# Patient Record
Sex: Male | Born: 1935 | Race: White | Hispanic: No | Marital: Married | Smoking: Current some day smoker
Health system: Southern US, Community
[De-identification: ages and names within clinical notes are randomized; demographics above are authoritative.]

## PROBLEM LIST (undated history)

## (undated) DIAGNOSIS — I714 Abdominal aortic aneurysm, without rupture, unspecified: Secondary | ICD-10-CM

## (undated) DIAGNOSIS — R296 Repeated falls: Secondary | ICD-10-CM

## (undated) DIAGNOSIS — Z8619 Personal history of other infectious and parasitic diseases: Secondary | ICD-10-CM

## (undated) DIAGNOSIS — I10 Essential (primary) hypertension: Secondary | ICD-10-CM

## (undated) DIAGNOSIS — N200 Calculus of kidney: Secondary | ICD-10-CM

## (undated) DIAGNOSIS — I639 Cerebral infarction, unspecified: Secondary | ICD-10-CM

## (undated) DIAGNOSIS — E785 Hyperlipidemia, unspecified: Secondary | ICD-10-CM

## (undated) DIAGNOSIS — K8020 Calculus of gallbladder without cholecystitis without obstruction: Secondary | ICD-10-CM

## (undated) DIAGNOSIS — K219 Gastro-esophageal reflux disease without esophagitis: Secondary | ICD-10-CM

## (undated) DIAGNOSIS — N189 Chronic kidney disease, unspecified: Secondary | ICD-10-CM

## (undated) DIAGNOSIS — I2510 Atherosclerotic heart disease of native coronary artery without angina pectoris: Secondary | ICD-10-CM

## (undated) DIAGNOSIS — M1990 Unspecified osteoarthritis, unspecified site: Secondary | ICD-10-CM

## (undated) DIAGNOSIS — M109 Gout, unspecified: Secondary | ICD-10-CM

## (undated) DIAGNOSIS — Z87442 Personal history of urinary calculi: Secondary | ICD-10-CM

## (undated) DIAGNOSIS — Z8719 Personal history of other diseases of the digestive system: Secondary | ICD-10-CM

## (undated) HISTORY — DX: Gastro-esophageal reflux disease without esophagitis: K21.9

## (undated) HISTORY — DX: Essential (primary) hypertension: I10

## (undated) HISTORY — DX: Calculus of gallbladder without cholecystitis without obstruction: K80.20

## (undated) HISTORY — PX: FRACTURE SURGERY: SHX138

## (undated) HISTORY — DX: Abdominal aortic aneurysm, without rupture: I71.4

## (undated) HISTORY — PX: COLONOSCOPY: SHX174

## (undated) HISTORY — DX: Cerebral infarction, unspecified: I63.9

## (undated) HISTORY — PX: CARDIAC CATHETERIZATION: SHX172

## (undated) HISTORY — DX: Abdominal aortic aneurysm, without rupture, unspecified: I71.40

## (undated) HISTORY — DX: Unspecified osteoarthritis, unspecified site: M19.90

## (undated) HISTORY — DX: Hyperlipidemia, unspecified: E78.5

---

## 1998-02-21 ENCOUNTER — Encounter: Payer: Self-pay | Admitting: Physician Assistant

## 1999-05-01 ENCOUNTER — Inpatient Hospital Stay (HOSPITAL_COMMUNITY): Admission: EM | Admit: 1999-05-01 | Discharge: 1999-05-05 | Payer: Self-pay | Admitting: Emergency Medicine

## 2002-07-31 ENCOUNTER — Encounter: Payer: Self-pay | Admitting: Emergency Medicine

## 2002-07-31 ENCOUNTER — Emergency Department (HOSPITAL_COMMUNITY): Admission: EM | Admit: 2002-07-31 | Discharge: 2002-07-31 | Payer: Self-pay | Admitting: Emergency Medicine

## 2007-06-02 ENCOUNTER — Ambulatory Visit: Payer: Self-pay | Admitting: Vascular Surgery

## 2007-12-05 ENCOUNTER — Ambulatory Visit: Payer: Self-pay | Admitting: Vascular Surgery

## 2008-12-10 ENCOUNTER — Ambulatory Visit: Payer: Self-pay | Admitting: Vascular Surgery

## 2009-06-10 ENCOUNTER — Ambulatory Visit: Payer: Self-pay | Admitting: Vascular Surgery

## 2009-06-21 ENCOUNTER — Encounter: Admission: RE | Admit: 2009-06-21 | Discharge: 2009-06-21 | Payer: Self-pay | Admitting: Family Medicine

## 2009-06-21 ENCOUNTER — Inpatient Hospital Stay (HOSPITAL_COMMUNITY): Admission: AD | Admit: 2009-06-21 | Discharge: 2009-06-24 | Payer: Self-pay | Admitting: Surgery

## 2009-06-21 ENCOUNTER — Encounter (INDEPENDENT_AMBULATORY_CARE_PROVIDER_SITE_OTHER): Payer: Self-pay | Admitting: Surgery

## 2009-07-19 ENCOUNTER — Encounter: Admission: RE | Admit: 2009-07-19 | Discharge: 2009-07-19 | Payer: Self-pay | Admitting: Surgery

## 2009-07-27 HISTORY — PX: APPENDECTOMY: SHX54

## 2009-08-09 ENCOUNTER — Inpatient Hospital Stay (HOSPITAL_COMMUNITY): Admission: EM | Admit: 2009-08-09 | Discharge: 2009-08-19 | Payer: Self-pay | Admitting: Emergency Medicine

## 2009-08-09 ENCOUNTER — Ambulatory Visit: Payer: Self-pay | Admitting: Internal Medicine

## 2009-08-30 ENCOUNTER — Ambulatory Visit: Payer: Self-pay | Admitting: Gastroenterology

## 2009-08-30 DIAGNOSIS — Z860100 Personal history of colon polyps, unspecified: Secondary | ICD-10-CM | POA: Insufficient documentation

## 2009-08-30 DIAGNOSIS — K5730 Diverticulosis of large intestine without perforation or abscess without bleeding: Secondary | ICD-10-CM | POA: Insufficient documentation

## 2009-08-30 DIAGNOSIS — R634 Abnormal weight loss: Secondary | ICD-10-CM | POA: Insufficient documentation

## 2009-08-30 DIAGNOSIS — A0472 Enterocolitis due to Clostridium difficile, not specified as recurrent: Secondary | ICD-10-CM | POA: Insufficient documentation

## 2009-08-31 ENCOUNTER — Encounter: Payer: Self-pay | Admitting: Gastroenterology

## 2009-12-23 ENCOUNTER — Ambulatory Visit: Payer: Self-pay | Admitting: Vascular Surgery

## 2010-05-26 ENCOUNTER — Ambulatory Visit: Payer: Self-pay | Admitting: Vascular Surgery

## 2010-11-26 DIAGNOSIS — I639 Cerebral infarction, unspecified: Secondary | ICD-10-CM

## 2010-11-26 HISTORY — DX: Cerebral infarction, unspecified: I63.9

## 2010-12-12 ENCOUNTER — Ambulatory Visit
Admission: RE | Admit: 2010-12-12 | Discharge: 2010-12-12 | Payer: Self-pay | Source: Home / Self Care | Attending: Vascular Surgery | Admitting: Vascular Surgery

## 2010-12-22 NOTE — Procedures (Unsigned)
DUPLEX ULTRASOUND OF ABDOMINAL AORTA  INDICATION:  Followup abdominal aortic aneurysm.  HISTORY: Diabetes:  No Cardiac:  No Hypertension:  Yes Smoking:  Yes Connective Tissue Disorder: Family History:  No Previous Surgery:  No  DUPLEX EXAM:         AP (cm)                   TRANSVERSE (cm) Proximal             2.51                      2.72 cm Mid                  4.9                       5.3 cm Distal               2.97                      3.36 cm Right Iliac          1.3 cm                    Not visualized Left Iliac           0.96                      Not visualized  PREVIOUS:  Date: 05/26/2010  AP:  5.12  TRANSVERSE:  5.4  IMPRESSION: 1. Stable abdominal aortic aneurysm measuring 4.9 cm x 5.3 cm. 2. Right iliac appears slightly ectatic measuring 1.3 cm.  ___________________________________________ Rosetta Posner, M.D.  LT/MEDQ  D:  12/12/2010  T:  12/12/2010  Job:  [id number]

## 2011-02-22 ENCOUNTER — Emergency Department (HOSPITAL_COMMUNITY): Payer: Medicare Other

## 2011-02-22 ENCOUNTER — Inpatient Hospital Stay (HOSPITAL_COMMUNITY)
Admission: EM | Admit: 2011-02-22 | Discharge: 2011-02-25 | DRG: 066 | Disposition: A | Discharge status: Home Health | Payer: Medicare Other | Attending: Internal Medicine | Admitting: Internal Medicine

## 2011-02-22 DIAGNOSIS — I714 Abdominal aortic aneurysm, without rupture, unspecified: Secondary | ICD-10-CM | POA: Diagnosis present

## 2011-02-22 DIAGNOSIS — I6350 Cerebral infarction due to unspecified occlusion or stenosis of unspecified cerebral artery: Principal | ICD-10-CM | POA: Diagnosis present

## 2011-02-22 DIAGNOSIS — I639 Cerebral infarction, unspecified: Secondary | ICD-10-CM

## 2011-02-22 DIAGNOSIS — E785 Hyperlipidemia, unspecified: Secondary | ICD-10-CM | POA: Diagnosis present

## 2011-02-22 DIAGNOSIS — F17200 Nicotine dependence, unspecified, uncomplicated: Secondary | ICD-10-CM | POA: Diagnosis present

## 2011-02-22 DIAGNOSIS — I10 Essential (primary) hypertension: Secondary | ICD-10-CM | POA: Diagnosis present

## 2011-02-22 DIAGNOSIS — R29898 Other symptoms and signs involving the musculoskeletal system: Secondary | ICD-10-CM | POA: Diagnosis present

## 2011-02-22 LAB — DIFFERENTIAL
Basophils Absolute: 0 K/uL (ref 0.0–0.1)
Basophils Relative: 0 % (ref 0–1)
Eosinophils Absolute: 0.1 10*3/uL (ref 0.0–0.7)
Eosinophils Relative: 1 % (ref 0–5)
Lymphocytes Relative: 18 % (ref 12–46)
Lymphs Abs: 1.3 10*3/uL (ref 0.7–4.0)
Monocytes Absolute: 0.7 10*3/uL (ref 0.1–1.0)
Monocytes Relative: 10 % (ref 3–12)
Neutro Abs: 5.2 K/uL (ref 1.7–7.7)
Neutrophils Relative %: 71 % (ref 43–77)

## 2011-02-22 LAB — COMPREHENSIVE METABOLIC PANEL WITH GFR
CO2: 26 meq/L (ref 19–32)
Calcium: 9.2 mg/dL (ref 8.4–10.5)
Creatinine, Ser: 1.15 mg/dL (ref 0.4–1.5)
GFR calc non Af Amer: >60 mL/min (ref >60)
Glucose, Bld: 127 mg/dL — ABNORMAL HIGH (ref 70–99)
Sodium: 140 meq/L (ref 135–145)
Total Protein: 7.2 g/dL (ref 6.0–8.3)

## 2011-02-22 LAB — COMPREHENSIVE METABOLIC PANEL
ALT: 14 U/L (ref 0–53)
AST: 18 U/L (ref 0–37)
Albumin: 4 g/dL (ref 3.5–5.2)
Alkaline Phosphatase: 87 U/L (ref 39–117)
BUN: 18 mg/dL (ref 6–23)
Chloride: 106 mEq/L (ref 96–112)
GFR calc Af Amer: >60 mL/min (ref >60)
Potassium: 4.4 mEq/L (ref 3.5–5.1)
Total Bilirubin: 0.3 mg/dL (ref 0.3–1.2)

## 2011-02-22 LAB — CBC
HCT: 41.2 % (ref 39.0–52.0)
Hemoglobin: 13.7 g/dL (ref 13.0–17.0)
MCH: 30.2 pg (ref 26.0–34.0)
MCHC: 33.3 g/dL (ref 30.0–36.0)
MCV: 90.9 fL (ref 78.0–100.0)
Platelets: 175 10*3/uL (ref 150–400)
RBC: 4.53 MIL/uL (ref 4.22–5.81)
RDW: 13.4 % (ref 11.5–15.5)
WBC: 7.3 K/uL (ref 4.0–10.5)

## 2011-02-22 LAB — URINALYSIS, ROUTINE W REFLEX MICROSCOPIC
Bilirubin Urine: NEGATIVE
Glucose, UA: NEGATIVE mg/dL
Hgb urine dipstick: NEGATIVE
Ketones, ur: NEGATIVE mg/dL
Nitrite: NEGATIVE
Protein, ur: NEGATIVE mg/dL
Specific Gravity, Urine: 1.019 (ref 1.005–1.030)
Urobilinogen, UA: 0.2 mg/dL (ref 0.0–1.0)
pH: 6 (ref 5.0–8.0)

## 2011-02-22 LAB — APTT: aPTT: 37 s (ref 24–37)

## 2011-02-22 LAB — PROTIME-INR
INR: 1.05 (ref 0.00–1.49)
Prothrombin Time: 13.9 s (ref 11.6–15.2)

## 2011-02-22 LAB — CK TOTAL AND CKMB (NOT AT ARMC)
CK, MB: 0.9 ng/mL (ref 0.3–4.0)
Relative Index: INVALID (ref 0.0–2.5)
Total CK: 41 U/L (ref 7–232)

## 2011-02-22 LAB — TROPONIN I: Troponin I: <0.01 ng/mL (ref 0.00–0.06)

## 2011-02-23 ENCOUNTER — Inpatient Hospital Stay (HOSPITAL_COMMUNITY): Payer: Medicare Other

## 2011-02-23 DIAGNOSIS — I059 Rheumatic mitral valve disease, unspecified: Secondary | ICD-10-CM

## 2011-02-23 LAB — LIPID PANEL
Cholesterol: 184 mg/dL (ref 0–200)
HDL: 34 mg/dL — ABNORMAL LOW (ref >39)
LDL Cholesterol: 98 mg/dL (ref 0–99)
Total CHOL/HDL Ratio: 5.4 ratio
Triglycerides: 259 mg/dL — ABNORMAL HIGH (ref <150)
VLDL: 52 mg/dL — ABNORMAL HIGH (ref 0–40)

## 2011-02-23 LAB — BASIC METABOLIC PANEL WITH GFR
BUN: 17 mg/dL (ref 6–23)
CO2: 24 meq/L (ref 19–32)
Calcium: 9.2 mg/dL (ref 8.4–10.5)
Chloride: 105 meq/L (ref 96–112)
Creatinine, Ser: 1.13 mg/dL (ref 0.4–1.5)
GFR calc Af Amer: >60 mL/min (ref >60)
GFR calc non Af Amer: >60 mL/min (ref >60)
Glucose, Bld: 100 mg/dL — ABNORMAL HIGH (ref 70–99)
Potassium: 4.1 meq/L (ref 3.5–5.1)
Sodium: 137 meq/L (ref 135–145)

## 2011-02-23 LAB — HEMOGLOBIN A1C
Hgb A1c MFr Bld: 5.7 % — ABNORMAL HIGH (ref <5.7)
Mean Plasma Glucose: 117 mg/dL — ABNORMAL HIGH (ref <117)

## 2011-02-23 LAB — GLUCOSE, CAPILLARY

## 2011-02-23 MED ORDER — GADOBENATE DIMEGLUMINE 529 MG/ML IV SOLN
15.0000 mL | Freq: Once | INTRAVENOUS | Status: AC | PRN
  Administered 2011-02-23: 15 mL via INTRAVENOUS

## 2011-02-23 NOTE — H&P (Signed)
NAME:  Marc [name], Marc [name]               ACCOUNT NO.:  [id number]  MEDICAL RECORD NO.:  [id number]           PATIENT TYPE:  E  LOCATION:  WLED                         FACILITY:  [facility]  PHYSICIAN:  Sherryl Manges, M.D.  DATE OF BIRTH:  Nov 26, 1930  DATE OF ADMISSION:  02/22/2011 DATE OF DISCHARGE:                             HISTORY & PHYSICAL   PRIMARY MD:  Claris Gower, MD  GENERAL SURGEON:  Alwyn Pea, MD  VASCULAR SURGEON:  Curt Jews, MD  CHIEF COMPLAINT:  Weakness in the right arm, overnight.  HISTORY OF PRESENT ILLNESS:  This is a 77 year old male.  According to him, he was quite well until he went to bed at 11 p.m. on February 21, 2011.  He was woken up at 3 a.m. in the morning of February 22, 2011, by pain in the left great toe.  He got up, took 2 Aleve and went back to bed.  He finally woke up at about 10 a.m. on February 22, 2011, got up to go to the bathroom and found that his right hand was weak.  He was unable to grip objects.  Right arm felt numb.  Speech also appeared mildly slurred.  The patient's vision was mildly blurred, but there was no lower extremity weakness.  He had no difficulty in ambulation, had no chest pain or shortness of breath, had no palpitations.  His daughter came by at about 12 p.m. and noted that there was something wrong with him.  She then took him to his primary MD's office, from where he was referred to the emergency department.  PAST MEDICAL HISTORY: 1. Hypertension. 2. Dyslipidemia. 3. Abdominal aortic aneurysm, 4.9 cm x 5.3 cm per ultrasound scan on     May 26, 2010. 4. Smoking history. 5. History of severe C difficile colitis 07/2009. 6. Diverticulosis. 7. Remote history of colon polyps. 8. 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 9. History of anemia. 10.History of query Morton's neuroma of his big toe.  ALLERGIES:  No known drug  allergies.  MEDICATION HISTORY: 1. Sinex NyQuil 2 capsules p.r.n. q.4 hourly as needed for allergies. 2. Vitamin D OTC 1 tablet p.o. daily. 3. Multivitamin 1 p.o. daily. 4. Aleve 1 to 2 tablets p.o. p.r.n. b.i.d. as needed for pain.  REVIEW OF SYSTEMS:  Systems review as per HPI and chief complaint.  The patient denies chest pain or shortness of breath or palpitations. Denies cough.  Denies abdominal pain, vomiting or diarrhea.  Has occasional pain in the left great toe in the past 1 year, which generally responds to Aleve.  Systems review is otherwise negative.  SOCIAL HISTORY:  The patient is married, has two offspring.  Drinks about 1 or 2 beers per week.  Smokes slightly less than a packet of cigarettes per day, has done so since age of 48 years.  Denies drug abuse.  FAMILY HISTORY:  Mother passed away at age 68 years.  She was obese, hypertensive, had a cerebrovascular accident.  The patient's father died in a motor vehicle accident  at age 28 years.  He was otherwise healthy prior to that.  PHYSICAL EXAMINATION:  VITAL SIGNS:  Temperature 97.2; pulse 75 per minute, regular; respiratory rate 20; BP 178/93 mmHg; pulse oximetry 100% on room air.   GENERAL:  The patient did not appear to be in obvious acute discomfort at time of this evaluation although he clearly had right facial asymmetry and slightly slurred speech.   HEENT:  No clinical pallor or jaundice.  No conjunctival injection.  Hydration status appears fair.   NECK:  Supple, JVP not seen.  No palpable lymphadenopathy.  No palpable goiter.   CHEST:  Clinically clear to auscultation.  No wheezes or crackles.   HEART:  Sounds S1, S2 heard, normal regular. No murmurs.   ABDOMEN:  Full, soft, nontender.  No palpable organomegaly or palpable masses.  Normal bowel sounds.   LOWER EXTREMITIES:  No pitting edema.  Palpable peripheral pulses.  Left great toe appears uninflamed.   MUSCULOSKELETAL:  Osteoarthritic changes are  noted. NEUROLOGIC:  The patient has right facial asymmetry, also is mildly dysarthric.  He has weakness of right arm grip of approximately 4-/5. Otherwise strength in right upper and lower, as well as left upper and lower extremities at least 5-/5.  INVESTIGATIONS:  CBC:  WBC 7.3, hemoglobin 13.7, hematocrit 41.2, platelets 175.  INR 1.05.  Electrolytes:  Sodium 140, potassium 4.4, chloride 106, CO2 26, BUN 18, creatinine 1.15, glucose 127.  LFTs are normal.  Troponin I point-of-care less than 0.01.  Urinalysis is negative.  Chest x-ray February 22, 2011, shows no acute disease.  Head CT scan February 22, 2011, shows chronic microvascular ischemia but no acute abnormality otherwise.  12-lead EKG February 22, 2011, shows sinus rhythm, regular, 90 per minute.  Left axis deviation, poor progression of R- waves across the chest leads; however no acute ischemic changes.  There were old inferior Q-waves.  ASSESSMENT AND PLAN: 1. Probable CVA:  Clinically the patient has cerebrovascular accident with right     upper monoparesis and dysarthria although transient ischemic attack     is possible.  He has presented outside the recommended window for t-     PA therefore this was not administered.  We shall admit him to telemetry     unit and we will do regular neuro checks, and a CVA workup     with brain MRI/MRA, neck MRA, as well as 2-D echocardiogram, lipid     profile, etc.  We shall also institute risk factor modification.     Meanwhile, commence him on low-dose aspirin and statin.  2. Smoking history:  The patient has been counseled appropriately.  He     will be managed with NicoDerm CQ patch.  3. Hypertension:  This is uncontrolled.  According to the patient, his     primary MD discontinued his antihypertensive medications     approximately 6 months' ago.  We shall start on low-dose Norvasc.  4. History of abdominal aortic aneurysm:  This appears stable at     present.  The patient will  continue to follow up routinely with his     vascular surgeon, Dr. Curt Jews.  Note:  The patient will undergo PT/OT.    Further management will depend on clinical course.     Sherryl Manges, M.D.     CO/MEDQ  D:  02/22/2011  T:  02/22/2011  Job:  [id number]  cc:   Claris Gower, M.D. Fax: [phone]  Rosetta Posner, M.D. [phone]  [place] [address]  Electronically Signed by Sherryl Manges M.D. on 02/23/2011 01:39:10 PM

## 2011-02-24 LAB — BASIC METABOLIC PANEL
BUN: 20 mg/dL (ref 6–23)
Chloride: 103 mEq/L (ref 96–112)
Creatinine, Ser: 1.23 mg/dL (ref 0.4–1.5)
GFR calc non Af Amer: 57 mL/min — ABNORMAL LOW (ref >60)

## 2011-02-24 LAB — CBC
HCT: 43.6 % (ref 39.0–52.0)
Hemoglobin: 14.6 g/dL (ref 13.0–17.0)
MCH: 30 pg (ref 26.0–34.0)
MCHC: 33.5 g/dL (ref 30.0–36.0)
RBC: 4.86 MIL/uL (ref 4.22–5.81)

## 2011-02-27 NOTE — Consult Note (Signed)
NAME:  [name], [name]               ACCOUNT NO.:  [id number]  MEDICAL RECORD NO.:  [id number]           PATIENT TYPE:  I  LOCATION:  [id number]                         FACILITY:  [facility]  PHYSICIAN:  Alexis Goodell, MD    DATE OF BIRTH:  1930/06/07  DATE OF CONSULTATION:  02/23/2011 DATE OF DISCHARGE:                                CONSULTATION   HISTORY OF PRESENT ILLNESS:  Marc [name] is a 77 year old male that awakened on February 21, 2011, with right upper extremity weakness, facial asymmetry and slurred speech.  The patient reports he was able to ambulate without assistance and did not appreciate any right lower extremity weakness.  Also reports no sensory complaints.  The patient presented for evaluation.  He has had workup and consult was called for further recommendations.  PAST MEDICAL HISTORY: 1. Morton neuroma. 2. Hypercholesterolemia. 3. Hypertension. 4. Colon polyps. 5. Anemia. 6. Abdominal aortic aneurysm. 7. Diverticulosis.  MEDICATIONS:  Sinex, multivitamin, vitamin D, aspirin and Aleve.  SOCIAL HISTORY:  The patient smokes greater than a pack a day of cigarettes.  He drinks 1-2 alcoholic beverages a week.  There is no history of illicit drugs.  He is married.  PHYSICAL EXAMINATION:  VITAL SIGNS:  Blood pressure 150/81, heart rate 76, respiratory rate 20, temperature 97.7. MENTAL STATUS TESTING:  The patient is alert and oriented.  He can follow commands without difficulty.  Speech is slightly slurred, but fluent. NEUROLOGIC:  On cranial nerve testing II, visual fields grossly intact; III, IV and VI, extraocular movements intact; V and VII, right facial droop; VIII grossly intact; IX and X decreased gag; XI bilateral shoulder shrug; XII midline tongue extension.  On motor exam, the patient is 4/5 in both the right upper and right lower extremity with 3+ over 5 of right hand grip.  The patient is 5/5 on the left.  On sensory testing, pinprick and light touch are  intact bilaterally.  Deep tendon reflexes are 2+ in the upper extremities and at the knees and absent at the ankles.  Plantars downgoing on the left and upgoing on the right. On cerebellar testing, finger-to-nose is intact with the left upper extremity and with both lower extremities.  There is some difficulty with right upper extremity finger-to-nose secondary to weakness.  LABORATORY DATA:  Sodium 137, potassium 4.1, chloride 105, bicarb 24, BUN and creatinine 17 and 1.13 respectively.  Glucose 100, white blood cell count 7.3, platelet count 175, hemoglobin and hematocrit 13.7 and 41.2 respectively.  LDL 98, HDL 34, triglycerides 259, hemoglobin A1c 5.7.  Head CT has been reviewed and shows no acute abnormalities. Echocardiogram showed no evidence of intracardiac thrombi.  MRI of the brain showed an acute infarct in the posterior limb of the left internal capsule.  MRA shows mild arteriosclerotic changes in the carotid arteries bilaterally.  There is moderate to severe stenosis at the origin of the right vertebral artery and modest stenosis in the distal right vertebral artery.  ASSESSMENT:  [name] is a 77 year old male with a left internal capsular infarct and findings consistent with such on exam.  The patient has multiple risk  factors to include hyperlipidemia, hypertension and tobacco abuse.  He was on an aspirin a day at home prior to admission.  PLAN: 1. The patient is to start Plavix 75 mg p.o. daily.  May discontinue     aspirin. 2. The patient is encouraged to stop smoking. 3. Occupational Therapy consult and continued followup after     discharge. 4. No further neurologic intervention at this time required.  The     patient may follow with Dr. Leonie Man at [facility] in 1-2 months.  Neurology     will sign off at this time.  The patient is considered stable for     discharge.          ______________________________ Alexis Goodell, MD     LR/MEDQ  D:  02/23/2011  T:   02/24/2011  Job:  [id number]  Electronically Signed by Alexis Goodell MD on 02/27/2011 06:25:00 PM

## 2011-03-02 LAB — DIFFERENTIAL
Basophils Absolute: 0 10*3/uL (ref 0.0–0.1)
Basophils Absolute: 0 10*3/uL (ref 0.0–0.1)
Basophils Relative: 0 % (ref 0–1)
Basophils Relative: 0 % (ref 0–1)
Basophils Relative: 1 % (ref 0–1)
Eosinophils Absolute: 0 10*3/uL (ref 0.0–0.7)
Eosinophils Absolute: 0 10*3/uL (ref 0.0–0.7)
Eosinophils Relative: 0 % (ref 0–5)
Eosinophils Relative: 0 % (ref 0–5)
Lymphs Abs: 1.2 10*3/uL (ref 0.7–4.0)
Monocytes Absolute: 1.2 10*3/uL — ABNORMAL HIGH (ref 0.1–1.0)
Monocytes Absolute: 1.3 10*3/uL — ABNORMAL HIGH (ref 0.1–1.0)
Monocytes Relative: 7 % (ref 3–12)
Neutro Abs: 11.8 10*3/uL — ABNORMAL HIGH (ref 1.7–7.7)
Neutro Abs: 16.2 10*3/uL — ABNORMAL HIGH (ref 1.7–7.7)
Neutrophils Relative %: 86 % — ABNORMAL HIGH (ref 43–77)

## 2011-03-02 LAB — CLOSTRIDIUM DIFFICILE EIA: C difficile Toxins A+B, EIA: NEGATIVE

## 2011-03-02 LAB — BASIC METABOLIC PANEL
BUN: 3 mg/dL — ABNORMAL LOW (ref 6–23)
BUN: 3 mg/dL — ABNORMAL LOW (ref 6–23)
BUN: 4 mg/dL — ABNORMAL LOW (ref 6–23)
BUN: 9 mg/dL (ref 6–23)
BUN: 12 mg/dL (ref 6–23)
CO2: 21 mEq/L (ref 19–32)
CO2: 20 mEq/L (ref 19–32)
Calcium: 7 mg/dL — ABNORMAL LOW (ref 8.4–10.5)
Calcium: 8 mg/dL — ABNORMAL LOW (ref 8.4–10.5)
Calcium: 8 mg/dL — ABNORMAL LOW (ref 8.4–10.5)
Chloride: 104 mEq/L (ref 96–112)
Chloride: 106 mEq/L (ref 96–112)
Chloride: 108 mEq/L (ref 96–112)
Creatinine, Ser: 0.82 mg/dL (ref 0.4–1.5)
Creatinine, Ser: 0.87 mg/dL (ref 0.4–1.5)
Creatinine, Ser: 0.95 mg/dL (ref 0.4–1.5)
Creatinine, Ser: 1.04 mg/dL (ref 0.4–1.5)
Creatinine, Ser: 1.11 mg/dL (ref 0.4–1.5)
GFR calc Af Amer: >60 mL/min (ref >60)
GFR calc Af Amer: >60 mL/min (ref >60)
GFR calc Af Amer: >60 mL/min (ref >60)
GFR calc Af Amer: >60 mL/min (ref >60)
GFR calc non Af Amer: >60 mL/min (ref >60)
GFR calc non Af Amer: >60 mL/min (ref >60)
GFR calc non Af Amer: >60 mL/min (ref >60)
Glucose, Bld: 98 mg/dL (ref 70–99)
Glucose, Bld: 129 mg/dL — ABNORMAL HIGH (ref 70–99)
Potassium: 2.7 mEq/L — CL (ref 3.5–5.1)
Potassium: 3.4 mEq/L — ABNORMAL LOW (ref 3.5–5.1)
Sodium: 135 mEq/L (ref 135–145)
Sodium: 135 mEq/L (ref 135–145)

## 2011-03-02 LAB — URINE CULTURE

## 2011-03-02 LAB — MAGNESIUM
Magnesium: 1.6 mg/dL (ref 1.5–2.5)
Magnesium: 1.7 mg/dL (ref 1.5–2.5)
Magnesium: 1.8 mg/dL (ref 1.5–2.5)

## 2011-03-02 LAB — URINE MICROSCOPIC-ADD ON

## 2011-03-02 LAB — URINALYSIS, ROUTINE W REFLEX MICROSCOPIC
Glucose, UA: NEGATIVE mg/dL
Nitrite: POSITIVE — AB
Specific Gravity, Urine: 1.028 (ref 1.005–1.030)
pH: 5.5 (ref 5.0–8.0)

## 2011-03-02 LAB — CBC
HCT: 31.4 % — ABNORMAL LOW (ref 39.0–52.0)
HCT: 33.3 % — ABNORMAL LOW (ref 39.0–52.0)
HCT: 37.7 % — ABNORMAL LOW (ref 39.0–52.0)
HCT: 42.9 % (ref 39.0–52.0)
Hemoglobin: 10.4 g/dL — ABNORMAL LOW (ref 13.0–17.0)
Hemoglobin: 12.7 g/dL — ABNORMAL LOW (ref 13.0–17.0)
Hemoglobin: 14.4 g/dL (ref 13.0–17.0)
MCHC: 33.2 g/dL (ref 30.0–36.0)
MCHC: 33.5 g/dL (ref 30.0–36.0)
MCHC: 33.7 g/dL (ref 30.0–36.0)
MCV: 88 fL (ref 78.0–100.0)
MCV: 88.2 fL (ref 78.0–100.0)
MCV: 89 fL (ref 78.0–100.0)
MCV: 88.4 fL (ref 78.0–100.0)
MCV: 88.9 fL (ref 78.0–100.0)
Platelets: 199 10*3/uL (ref 150–400)
Platelets: 205 10*3/uL (ref 150–400)
Platelets: 261 10*3/uL (ref 150–400)
RBC: 3.79 MIL/uL — ABNORMAL LOW (ref 4.22–5.81)
RBC: 4.26 MIL/uL (ref 4.22–5.81)
RDW: 14.8 % (ref 11.5–15.5)
RDW: 15 % (ref 11.5–15.5)
RDW: 14.9 % (ref 11.5–15.5)
WBC: 11.2 10*3/uL — ABNORMAL HIGH (ref 4.0–10.5)
WBC: 16.7 10*3/uL — ABNORMAL HIGH (ref 4.0–10.5)

## 2011-03-02 LAB — STOOL CULTURE

## 2011-03-02 LAB — OVA AND PARASITE EXAMINATION

## 2011-03-02 LAB — COMPREHENSIVE METABOLIC PANEL
AST: 12 U/L (ref 0–37)
Albumin: 1.9 g/dL — ABNORMAL LOW (ref 3.5–5.2)
Alkaline Phosphatase: 79 U/L (ref 39–117)
BUN: 10 mg/dL (ref 6–23)
Chloride: 104 mEq/L (ref 96–112)
Chloride: 100 mEq/L (ref 96–112)
Creatinine, Ser: 1.01 mg/dL (ref 0.4–1.5)
Creatinine, Ser: 1.2 mg/dL (ref 0.4–1.5)
GFR calc Af Amer: >60 mL/min (ref >60)
Glucose, Bld: 150 mg/dL — ABNORMAL HIGH (ref 70–99)
Potassium: 3.7 mEq/L (ref 3.5–5.1)
Total Bilirubin: 1.3 mg/dL — ABNORMAL HIGH (ref 0.3–1.2)
Total Bilirubin: 1.3 mg/dL — ABNORMAL HIGH (ref 0.3–1.2)

## 2011-03-02 LAB — PHOSPHORUS
Phosphorus: 3 mg/dL (ref 2.3–4.6)
Phosphorus: 3.4 mg/dL (ref 2.3–4.6)

## 2011-03-02 LAB — POTASSIUM: Potassium: 3.5 mEq/L (ref 3.5–5.1)

## 2011-03-02 LAB — FECAL LACTOFERRIN, QUANT: Fecal Lactoferrin: POSITIVE

## 2011-03-04 LAB — BASIC METABOLIC PANEL
BUN: 16 mg/dL (ref 6–23)
CO2: 22 mEq/L (ref 19–32)
CO2: 25 mEq/L (ref 19–32)
Calcium: 8 mg/dL — ABNORMAL LOW (ref 8.4–10.5)
Calcium: 9.1 mg/dL (ref 8.4–10.5)
Chloride: 101 mEq/L (ref 96–112)
GFR calc Af Amer: 51 mL/min — ABNORMAL LOW (ref >60)
GFR calc non Af Amer: 56 mL/min — ABNORMAL LOW (ref >60)
Glucose, Bld: 128 mg/dL — ABNORMAL HIGH (ref 70–99)
Glucose, Bld: 171 mg/dL — ABNORMAL HIGH (ref 70–99)
Sodium: 131 mEq/L — ABNORMAL LOW (ref 135–145)

## 2011-03-04 LAB — CBC
HCT: 30 % — ABNORMAL LOW (ref 39.0–52.0)
Hemoglobin: 10.2 g/dL — ABNORMAL LOW (ref 13.0–17.0)
Hemoglobin: 10.3 g/dL — ABNORMAL LOW (ref 13.0–17.0)
MCHC: 33.9 g/dL (ref 30.0–36.0)
MCHC: 34.3 g/dL (ref 30.0–36.0)
MCHC: 34.4 g/dL (ref 30.0–36.0)
MCV: 91.4 fL (ref 78.0–100.0)
MCV: 91.8 fL (ref 78.0–100.0)
Platelets: 140 10*3/uL — ABNORMAL LOW (ref 150–400)
RBC: 3.26 MIL/uL — ABNORMAL LOW (ref 4.22–5.81)
RBC: 3.27 MIL/uL — ABNORMAL LOW (ref 4.22–5.81)
RDW: 13.3 % (ref 11.5–15.5)
RDW: 13.3 % (ref 11.5–15.5)
RDW: 13.6 % (ref 11.5–15.5)

## 2011-03-04 LAB — COMPREHENSIVE METABOLIC PANEL
ALT: 13 U/L (ref 0–53)
Albumin: 2.5 g/dL — ABNORMAL LOW (ref 3.5–5.2)
Alkaline Phosphatase: 49 U/L (ref 39–117)
BUN: 18 mg/dL (ref 6–23)
Calcium: 7.4 mg/dL — ABNORMAL LOW (ref 8.4–10.5)
Glucose, Bld: 108 mg/dL — ABNORMAL HIGH (ref 70–99)
Potassium: 3.6 mEq/L (ref 3.5–5.1)
Sodium: 128 mEq/L — ABNORMAL LOW (ref 135–145)
Total Protein: 5 g/dL — ABNORMAL LOW (ref 6.0–8.3)

## 2011-03-04 LAB — CREATININE, SERUM: Creatinine, Ser: 1.33 mg/dL (ref 0.4–1.5)

## 2011-03-08 NOTE — Discharge Summary (Signed)
NAME:  Marc [name], Marc [name]               ACCOUNT NO.:  [id number]  MEDICAL RECORD NO.:  [id number]           PATIENT TYPE:  I  LOCATION:  1406                         FACILITY:  [facility]  PHYSICIAN:  Bonnielee Haff, MD     DATE OF BIRTH:  [birth year]  DATE OF ADMISSION:  02/22/2011 DATE OF DISCHARGE:  02/25/2011                              DISCHARGE SUMMARY   PRIMARY CARE PHYSICIAN:  Claris Gower, M.D.  The patient was seen in consultation by neurologist, Dr. Doy Mince.  STUDIES PERFORMED: 1. MRI of the brain which showed an acute infarct in the posterior     limb of the internal capsule on the left. 2. MRA of the head showed moderate intracranial atherosclerotic     disease, most notable in the right posterior cerebral artery. 3. MRA of neck showed mild atherosclerotic changes in the carotid     arteries bilaterally, moderate-to-severe stenosis at the origin of     the right vertebral artery and moderate stenosis of the distal     right vertebral artery. 4. CT of the head was done on March 29th which showed chronic ischemia     and then repeated on March 30th for severe headache and showed no     acute process. 5. Chest x-ray showed no active lung disease. 6. Echocardiogram was done which showed systolic function to be normal     at 55% to [id number], grade 1 diastolic dysfunction.  Mild aortic and mild     mitral regurgitation was noted.  Trivial pericardial effusion was     also identified.  DISCHARGE DIAGNOSES: 1. Acute stroke affecting right upper extremity. 2. History of abdominal aortic aneurysm requiring outpatient followup. 3. History of hypertension. 4. Dyslipidemia. 5. Tobacco abuse.  BRIEF HOSPITAL COURSE: 1. Acute stroke.  This is a 77 year old Caucasian male who presented     to the hospital with complaints of weakness in the right arm.  The     patient was past the TPA time frame by the time he got into the ED.     The patient's weakness is restricted to the right upper  extremity     and more specifically to his hand, his fingers, and his the wrist.     The patient underwent evaluation in the hospital including a     neurological consultation.  The patient's aspirin was changed over     to Plavix, he was started on a statin medication.  He was also     started on amlodipine because when he presented to the hospital,     his blood pressure was high; however, his blood pressure has come     down significantly and since we want to allow some degree of     permissive hypertension, I am going to go ahead and discontinue his     amlodipine for now.  We want to avoid worsening of his stroke     symptoms.  No intervention at this time apart from medical     management.  He would be asked to follow up with Dr. Leonie Man in the  outpatient setting in a month.  PT/OT saw the patient and home     health will be arranged.  He also is a little bit dysarthric and     speech therapy will also be arranged as an outpatient. 2. Hypertension.  When he presented to the ED, the patient's blood     pressure was 179/111, but then came down to 155/71.  He was started     on amlodipine at the time of admission; however, his blood pressure     has come down, it has been as low as 102/71.  I am going to go     ahead and discontinue the amlodipine for now and have him follow up     with his primary care physician within a week. 3. Tobacco abuse.  He was counseled to quit smoking.  Nicotine patch     will be prescribed. 4. He has a history of abdominal aortic aneurysm and he can follow up     with Dr. Donnetta Hutching as he has been doing so in the past. 5. Dyslipidemia was detected during this admission with an LDL of 98,     his triglycerides of 259.  Because of his acute stroke, he was     started on Zocor.  His LFTs were normal.  On the day of discharge, the patient is feeling well, still has weakness in the right upper extremity, but denies any other new complaints.  His vital signs are  all pretty much stable.  His lungs are clear to auscultation bilaterally.  No wheezing, rales, or rhonchi. Cardiovascular, S1 and S2 are normal, regular.  Abdomen is soft.  His right upper extremity, especially the hand strength is still 3-4/5 in intensity.  ASSESSMENT AND PLAN:  As per above.  FOLLOWUP: 1. With Dr. Leonie Man in a month.  Phone number has been provided. 2. With Dr. Arelia Sneddon, his PCP within a week.  He needs to call for     appointment.  DIET:  Heart healthy.  PHYSICAL ACTIVITY:  As per PT, OT.  Home health will be arranged.  TOTAL TIME ON THIS DISCHARGE ENCOUNTER:  35 minutes.  Bonnielee Haff, MD     GK/MEDQ  D:  02/25/2011  T:  02/26/2011  Job:  [id number]  cc:   Pramod P. Leonie Man, MD [phone]  Claris Gower, M.D. [phone]  Rosetta Posner, M.D. [address] [place] [address]  Electronically Signed by Bonnielee Haff MD on 03/08/2011 11:22:32 PM

## 2011-04-10 NOTE — Procedures (Signed)
DUPLEX ULTRASOUND OF ABDOMINAL AORTA   INDICATION:  Followup, abdominal aortic aneurysm.   HISTORY:  Diabetes:  No.  Cardiac:  No.  Hypertension:  Yes.  Smoking:  One pack per day.  Connective Tissue Disorder:  Family History:  Previous Surgery:   DUPLEX EXAM:         AP (cm)                   TRANSVERSE (cm)  Proximal             2.98 cm                   2.86 cm  Mid                  4.23 cm                   4.35 cm  Distal               2.15 cm                   1.99 cm  Right Iliac          1.08 cm  Left Iliac           1.06 cm   PREVIOUS:  Date: 04/18/2007  AP:  4.1  TRANSVERSE:  4.1  At [facility]   IMPRESSION:  Slight increase in diameter of known abdominal aortic  aneurysm.   ___________________________________________  Rosetta Posner, M.D.   DP/MEDQ  D:  12/05/2007  T:  12/05/2007  Job:  [id number]

## 2011-04-10 NOTE — H&P (Signed)
NAME:  Marc [name], Marc [name]               ACCOUNT NO.:  [id number]   MEDICAL RECORD NO.:  [id number]          PATIENT TYPE:  AMB   LOCATION:  DAY                          FACILITY:  [facility]   PHYSICIAN:  Adin Hector, MD     DATE OF BIRTH:  01-Oct-1930   DATE OF ADMISSION:  06/21/2009  DATE OF DISCHARGE:                              HISTORY & PHYSICAL   PRIMARY CARE PHYSICIAN:  Dr. Arelia Sneddon.   VASCULAR SURGEON:  Dr. Sherren Mocha Early.   SURGEON:  Michael Boston.   REASON FOR EVALUATION:  Abdominal pain with probable appendicitis.   HISTORY OF PRESENT ILLNESS:  Mr. Buchholtz is a 77 year old male with  abdominal aortic aneurysm followed by Dr. Curt Jews.  It has been  around 5 cm in size.  Two days ago he started having diffuse abdominal  pain.  It had intensified until last night and it seemed to back off.  Then the pain became more focused in the right lower quadrant.  He never  had pain like this before.  He normally has a bowel movement about every  day or every other day.  He has a distant history of colon polyps based  on a colonoscopy in 1999.  He has refused followup colonoscopy at this  time.  No hematochezia or melena.  His wife does have a history of C.  diff colitis for which she is recovering after antibiotic dose.  He  denies any diarrhea.  He has had some nausea but no definite emesis.  His appetite is decreased.  No history of inflammatory bowel disease or  irritable bowel syndrome.  His daughter does have any irritable bowel  syndrome.   Based on concern, he saw Dr. Arelia Sneddon who evaluated him and sent him for a  CAT scan with the abdominal aortic aneurysm looking stable although it  is around 5.3 cm but no evidence of appendicitis with probable early  perforation as well, no abscess.  Based on that, Dr. Arelia Sneddon called me to  evaluate the patient.   PAST MEDICAL HISTORY:  1. Hypertension.  2. Hypercholesterolemia.  3. Abdominal aortic aneurysm, followed by Dr. Curt Jews, most    recently 4.9 cm on ultrasound back in May 2010, 5.3 cm based on CAT      scan today.  4. Question peripheral neuropathy versus gout, on chronic Neurontin.  5. Moderate alcohol use.  6. Tobacco abuse, just quit last night.   PAST SURGICAL HISTORY:  No abdominal surgeries.  Again, colonoscopy in  1999 showing 3 polyps with polypectomy with no followup colonoscopy yet.   SOCIAL HISTORY:  A 60 pack-year history of tobacco.  Alcohol, fairly  intensive alcohol drinking in the past but down to 2 beers a day  according to his daughter.  No other drug use.  He is married and has  family in the region.   FAMILY HISTORY:  His daughter has irritable bowel syndrome but no other  GI issues.  No history of inflammatory bowel disease such as Crohn's or  ulcerative colitis.  No history of colon cancer.   ALLERGIES:  NO KNOWN DRUG ALLERGIES.   MEDICATIONS:  Include:  1. Aspirin.  2. Neurontin.   REVIEW OF SYSTEMS:  Noted on the chart.  He has had some subjective  fevers but no definite chills or sweats.  His weight has been stable.  EYES:  He is negative.  ENT:  Negative.  CARDIAC:  He can walk about 20  minutes before he has to stop.  No exertional chest pain or shortness of  breath.  No history of coronary disease.  RESPIRATORY:  No recent cold,  coughs, or flus.  GI:  As noted per HPI.  GU:  No dysuria, pyuria,  hematuria, or urinary tract infections.  HEME:  Negative.  LYMPH:  Negative.  ALLERGIC:  Negative.  PSYCH:  Negative.  NEUROLOGICAL:  Some  distal foot pain, question of neuropathy.  MUSCULOSKELETAL:  As noted  above, otherwise negative.  ENDOCRINE:  Negative.  HEPATIC:  Negative.  RENAL:  Negative.   VITAL SIGNS:  Noted on the chart, most recently in the normal range.   PHYSICAL EXAM:  He is a well-developed, thin male in bed, in no acute  distress.  PSYCH:  He is awake, alert and oriented x4.  He has got average to below  average intelligence but pretty good insight.  No  evidence of any  dementia, delirium, psychosis, or paranoia.  EYES:  Pupils equal, round, and reactive to light.  Extraocular  movements are intact.  Sclerae are nonicteric or injected.  NECK:  Supple without any masses.  Trachea is midline.  SKIN:  No petechia or purpura.  No other sores or lesions.  HEENT:  He is normocephalic.  Mucous membranes are dry but nasopharynx  and oropharynx are clear.  HEART:  Regular rate and rhythm.  No murmurs, gallops, or rubs.  CHEST:  Clear to auscultation bilaterally.  No wheezes, rales, or  rhonchi.  ABDOMEN:  Soft and slightly distended and slightly overweight but not  severely obese.  He has mild discomfort in the upper abdomen but he has  exquisite tenderness in the right lower quadrant with voluntary guarding  over McBurney point.  He has a little mild Rovsing sign on the left side  as well.  He has an obvious umbilical hernia about 1.5 cm in size  through the stalk but is easily reducible.  GENERAL:  Normal external genitalia.  No evidence of inguinal hernias.  RECTAL:  Deferred per patient and Gross.  EXTREMITIES:  Some mild clubbing but no cyanosis or edema.  MUSCULOSKELETAL:  Good range of motion in shoulders, elbows, wrists, as  well as hips, knees, and ankles.  SKIN:  No petechia purpura.  No telangiectasias.  LYMPH:  No head, neck, axillary, or groin lymphadenopathy.  BACK:  Mild lower lumbar back discomfort but no costophrenic angle  tenderness and normal range of motion at the cervical thoracolumbosacral  spine.   LABORATORY VALUES:  He has a white count of 24 with a hemoglobin of 14  and a left shift.  He does have EKG which shows heart rate around 105  but no evidence of any major dysrhythmia.   CAT scan shows abdominal aortic aneurysm about 5.3 cm in size,  infrarenal.  He has evidence of an enlarged appendix at 15 mm.  There is  some stranding and a few dots of air concerning for perforation.   ASSESSMENT/PLAN:  70. A  77 year old male with some alcohol issues and other mild health      issues but  with a history and physical on CAT scan strongly      suspicious for early perforated appendicitis.  The anatomy and      physiology of the digestive tract was explained.  Pathophysiology      of appendicitis with its natural history, risks of peritonitis,      septic shock, and death were discussed.  Options discussed and      recommendations made for a laparoscopic possible open appendectomy.      Risks, benefits, and alternatives discussed.  Options were      discussed.  Questions answered and he and his daughter agreed to      proceed.  He also has an umbilical hernia there and I will try and      pull the appendix out through this slot and repair it primarily.  I      do think mesh would be a good idea but hopefully it can come      together well.  Technique was discussed as well.  He and his      daughter expressed understanding and appreciation other points as      well.  Follow up on hypertension.  2. Abdominal aortic aneurysm slightly increased based on the      ultrasound but that is probably just a variation in technique.  If      he has persistent symptoms or other concerns, we will get Vascular      Surgery involved but I think my suspicion is low.  3. Blood cell pressure control.  Probably start on perioperative beta-      blocker.  4. Add Flagyl to prevent C. diff colitis.  5. Watch out for alcohol withdrawal.  6. Pulmonary toilet given tobacco history.      Adin Hector, MD  Electronically Signed     SCG/MEDQ  D:  06/21/2009  T:  06/21/2009  Job:  [id number]   cc:   Dr. Mickel Baas, M.D.  [address]  [place]  [place] [address]

## 2011-04-10 NOTE — Assessment & Plan Note (Signed)
OFFICE VISIT   KEONDRE, GAUDREAU T  DOB:  26-Jul-1930                                       12/23/2009  [id number]   The patient returns today for evaluation of infrarenal abdominal aortic  aneurysm.  He was last seen 6 months ago with an ultrasound suggesting a  4.9-cm aneurysm.  He today has a maximal diameter of 5.3 cm on  ultrasound.  He has no symptoms referable to his aneurysm.  He did have  surgery for a ruptured appendix in September 2010.  He did have a CT  scan around the time of this and I have reviewed this.  At that time his  maximal diameter was 5.3 cm by CT scan.  He does have significant  atherosclerotic changes in his iliac vessels but it appears that he  would be a candidate for stent grafting.  He does not have any abdominal  pain related to his aneurysm.   REVIEW OF SYSTEMS:  Otherwise unchanged from before.   PHYSICAL EXAMINATION:  A well-developed white male appearing stated age  of 41.  His blood pressure is 174/100, heart rate is 82, temperature is  98.3.  His abdomen reveals a prominent aortic pulsation with no  tenderness.  His femoral pulses are 1+ to 2+.   I have reviewed his CT scan and discussed this at length with the  patient.  I feel comfortable following him another 6 months with  ultrasound.  He understands that he is on the threshold for elective  repair and we will continue with ultrasound followup.     Rosetta Posner, M.D.  Electronically Signed   TFE/MEDQ  D:  12/23/2009  T:  12/26/2009  Job:  [id number]   cc:   Claris Gower, M.D.

## 2011-04-10 NOTE — Discharge Summary (Signed)
NAME:  Marc [name], Marc [name]               ACCOUNT NO.:  [id number]   MEDICAL RECORD NO.:  [id number]          PATIENT TYPE:  INP   LOCATION:  [place]                         FACILITY:  [facility]   PHYSICIAN:  Adin Hector, MD     DATE OF BIRTH:  09/22/1930   DATE OF ADMISSION:  06/21/2009  DATE OF DISCHARGE:                               DISCHARGE SUMMARY   PRIMARY CARE PHYSICIAN:  Dr. Arelia Sneddon.   SURGEON:  Dr. Michael Boston.   PRINCIPAL FINAL DISCHARGE DIAGNOSIS:  Acute perforated gangrenous  appendicitis.   OTHER DIAGNOSES:  Include:  1. Hypertension.  2. Hypercholesterolemia.  3. Abdominal aortic aneurysm followed by Dr. Sherren Mocha Early.  4. Peripheral neuropathy versus gout, on chronic Neurontin.  5. Moderate alcohol use.  6. Tobacco abuse, recently quit.  7. Colon polyps status post colonoscopy with no follow-up colonoscopy      done yet.  8. Acute renal failure.   PROCEDURES PERFORMED:  Diagnostic laparoscopy and appendectomy on June 21, 2009.   SUMMARY OF HOSPITAL COURSE:  Marc [name] is a 77 year old male with  numerous health issues who came in with pain for several days and was  found to have a CAT scan of perforated appendicitis.  He underwent  surgery.  Unfortunately, I could not remove this laparoscopically.  His  creatinine did get as high as 1.6.  After some hydration, it came down  closer to normal and at the time of discharge it was down to 1.33.  He  was given IV antibiotics and his white count began to normalize.  He was  switched over to oral antibiotics and on the last day his white count  completely normalized.   He was walking the hallways.  He was having flatus and loose bowel  movements.  His pain was controlled with oral medications.  Based on  these improvements, it was felt it would be reasonable to discharge him  home on the following instructions:  1. He is to take ciprofloxacin 500 mg p.o. b.i.d. as well as Flagyl      500 mg p.o. q.i.d. for the next 5 days  to complete antibiotic      course to minimize the chance of postoperative abscess and      infection.  2. He should take oxycodone 5-15 mg p.o. q.4 h. p.r.n. pain, Tylenol      p.r.n., pain ice p.r.n. pain and heat p.r.n. pain.  3. He should avoid nonsteroidal agents such as Aleve and ibuprofen      given the renal failure episode.  4. He should resume his home medications which includes aspirin 81 mg      p.o. daily as well as Neurontin 300 mg p.o. daily and multivitamin      daily and lisinopril daily.  He does not no specific dose.  5. He should call if he has any fevers, chills, sweats, nausea,      vomiting, worsening pain, worsening distention,      uncontrolled diarrhea, constipation or other issues.  6. He should return to clinic to see [place] in about 7-10  days to make      sure for closer follow-up to make sure he is recovering and      otherwise well.  Questions were answered and he agreed with this      plan.      Adin Hector, MD  Electronically Signed     SCG/MEDQ  D:  06/24/2009  T:  06/24/2009  Job:  [id number]   cc:   Dr. Mickel Baas, M.D.  [address]  [place]  [place] [address]

## 2011-04-10 NOTE — Consult Note (Signed)
NEW PATIENT CONSULTATION   Marc [name], [name]  DOB:  1930/02/18                                       06/02/2007  [id number]   Marc Marc [name] presents today for evaluation of incidental finding of  abdominal aortic aneurysm on a screening study.  He had no prior  knowledge of this and underwent an ultrasound in May revealing a 4 cm  maximal diameter infrarenal aneurysm.  I am seeing him for discussion of  this.   PAST MEDICAL HISTORY:  No history of heart disease.  He is on medication  for hypertension and elevated cholesterol.  He has no history of  diabetes, heart attack or congestive heart failure.   SOCIAL HISTORY:  He is married with one child.  He is retired.  He  smokes one pack of cigarettes per Marc [name].  Drinks two beers per Marc [name].   His weight is 160 pounds, height 5 feet 7 inches.   REVIEW OF SYSTEMS:  Positive for occasional blood in stool with reflux  and hiatal hernia, occasional diarrhea, has pain in his legs with fast  walking and arthritic pain.   MEDICATIONS:  1. Hydrochlorothiazide 25 mg daily.  2. Gemfibrozil 600 mg daily.  3. Niacin 500 mg b.i.d.  4. Aspirin 81 mg.   PHYSICAL EXAMINATION:  GENERAL APPEARANCE:  A well-developed, well-  nourished white male appearing stated age of 46.  VITAL SIGNS:  Blood pressure 145/89, pulse 102, respirations 18.  His  radial pulses are 2+ bilaterally.  He has 2+ femoral and 1+ posterior  tibial pulses bilaterally.  ABDOMEN:  Prominent aortic pulsation with no tenderness.   I reviewed his ultrasound with him.  I explained that he does have a  small aneurysm.  We would recommend repeat ultrasound of this in six  months.  If this continues to show no significant growth, we would then  follow this on a yearly basis.  I discussed symptoms of leaking aneurysm  with him and he knows immediately to present to [place] ER should this occur.  Otherwise, we will see him in six  months.   Rosetta Posner, M.D.  Electronically Signed   TFE/MEDQ  D:  06/02/2007  T:  06/03/2007  Job:  [id number]   cc:   Claris Gower, M.D.

## 2011-04-10 NOTE — Procedures (Signed)
DUPLEX ULTRASOUND OF ABDOMINAL AORTA   INDICATION:  Abdominal aortic aneurysm.   HISTORY:  Diabetes:  No  Cardiac:  No  Hypertension:  Yes  Smoking:  Yes  Family History:  No  Previous Surgery:  Ruptured appendix   DUPLEX EXAM:         AP (cm)                   TRANSVERSE (cm)  Proximal             2.8 cm                    2.9 cm  Mid                  3.0 cm                    3.5 cm  Distal               5.12 cm                   5.4 cm  Right Iliac          not visualized            not visualized  Left Iliac           not visualized            not visualized   PREVIOUS:  Date: 12/23/2009  AP:  5.32  TRANSVERSE:  5.03   IMPRESSION:  1. Aneurysmal dilatation of the distal abdominal aorta with no      significant change when compared to the previous exam.  2. Unable to adequately visualize the bilateral common iliac arteries      due to overlying bowel gas patterns.   ___________________________________________  Rosetta Posner, M.D.   CH/MEDQ  D:  05/30/2010  T:  05/30/2010  Job:  [id number]

## 2011-04-10 NOTE — Procedures (Signed)
DUPLEX ULTRASOUND OF ABDOMINAL AORTA   INDICATION:  Follow up abdominal aortic aneurysm.   HISTORY:  Diabetes:  No.  Cardiac:  No.  Hypertension:  Yes.  Smoking:  Yes.  Connective Tissue Disorder:  Family History:  Previous Surgery:   DUPLEX EXAM:         AP (cm)                   TRANSVERSE (cm)  Proximal             2.75 cm                   2.83 cm  Mid                  4.94 cm                   4.62 cm  Distal               3.01 cm                   2.80 cm  Right Iliac          1.49 cm                   1.53 cm  Left Iliac           1.21 cm                   1.31 cm   PREVIOUS:  Date:  AP:  4.23  TRANSVERSE:  4.35   IMPRESSION:  Abdominal aortic aneurysm noted with the largest  measurement of 4.94 cm X 4.62 cm.   ___________________________________________  Rosetta Posner, M.D.   MG/MEDQ  D:  12/10/2008  T:  12/10/2008  Job:  [id number]

## 2011-04-10 NOTE — Op Note (Signed)
NAME:  Marc [name], Marc [name]               ACCOUNT NO.:  [id number]   MEDICAL RECORD NO.:  [id number]          PATIENT TYPE:  AMB   LOCATION:  DAY                          [facility]ACILITY[facility]   PHYSICIAN:  Adin Hector, MD     DATE OF BIRTH:  October 09, 1930   DATE OF PROCEDURE:  06/21/2009  DATE OF DISCHARGE:                               OPERATIVE REPORT   PRIMARY CARE PHYSICIAN:  Dr. Arelia [name].   SURGEON:  Adin Hector, M.D.   ASSISTANT:  RN.   PREOPERATIVE DIAGNOSIS:  Perforated appendicitis.   POSTOPERATIVE DIAGNOSES:  1. Perforated appendicitis with gangrene.  2. Umbilical hernia.   PROCEDURES PERFORMED:  1. Laparoscopic lysis of adhesions x45 minutes, equals half the case.  2. Laparoscopic appendectomy.  3. Primary umbilical hernia repair.   ANESTHESIA:  1. General anesthesia.  2. Local anesthetic with field block around port sites.   SPECIMEN:  Appendix.   DRAINS:  None.   ESTIMATED BLOOD LOSS:  100 mL.   COMPLICATIONS:  None major.   INDICATIONS:  Mr. Marc [name] is a 77 year old gentleman with moderate  alcohol and tobacco abuse and other mild health issues, who has had  initially a 24-hour, but then his family says up to a 3-day history of  worsening abdominal pain with some nausea.  He saw his primary care  physician.  Dr. Arelia [name] was concerned and did a CAT scan, which showed  his abdominal aortic aneurysm was relatively stable, but his appendix  looked perforated.   The anatomy and physiology of hepatobiliary and pancreatic function was  discussed.  The pathophysiology of appendicitis with its natural history  and risks were discussed.  Options were discussed and recommendation was  made for laparoscopic appendectomy.  He also has an umbilical hernia  around that area that was uncomfortable as well.  It was reducible.  The  technique of repair of that was discussed as well.  Because this is a  perforated case, I recommended primary repair only and not mesh.  Risks,  benefits, and alternatives were discussed.  Questions were answered.  He  and his daughter agreed to proceed.  I talked to his wife just before  surgery as well.   OPERATIVE FINDINGS:  He had a short appendix with at least the distal  two-thirds with ischemia and the distal half with gangrene and frank  perforation with fecaliths.  He had moderate interloop adhesions and  suppuration.  He had evidence of pus in his pelvis.  He did not have a  chronic walled-off abscess.  He had moderate adhesions of omentum to his  whole ascending colon as well.   DESCRIPTION OF PROCEDURE:  Informed consent was confirmed.  The patient  received IV cefoxitin and Flagyl.  He was given some IV metoprolol  preoperatively, given his tachycardia.  He underwent general anesthesia  without any difficulty.  A Foley catheter was sterilely placed.  He had  sequential compression devices active during the entire case.  He was  positioned supine with the left arm tucked.  His abdomen and perineum  were clipped, prepped and  draped in a sterile fashion.   A #5-mm port was placed in the right upper quadrant using optical entry  technique with the patient in steep reversed Trendelenburg and right-  side up.  Camera inspection revealed no injury.  However, he had  moderate adhesions to his right flank.  Under direct visualization, I  placed a 5-mm port in the suprapubic region.  I switched camera ports  and was able to place a [id number] port supraumbilically through his  umbilical hernia.  Dissection with primarily ultrasonic and some blunt  dissection was used to free the greater omentum adhesions to the right  flank, especially the right upper quadrant as well.  There was some  oozing of the omentum in doing this, as the omentum was inflamed and  friable, but eventually I freed it off.  With that I could grab the  greater omentum off the small bowel and the right colon.  I could better  see the ileocecal junction.   The  ileocecal region was mobilized in a lateral and medial fashion by  following the terminal ileum and using ultrasonic dissection to free its  attachments off the right lower quadrant, staying close to the mesentery  in doing that.  In peeling it off, I did get a tear in the appendiceal  mesentery and there was some bleeding that resolved with pressure.  The  point area was isolated and controlled with ultrasonic dissection.   With further mobilization of the ascending colon, I could finally flip  the retrocecal appendix up and had it some dense adhesions in the right  flank and paracolic gutter.  The appendix was frankly dead in the distal  half and the appendiceal mesentery was densely adherent to the colonic  mesentery.  I was able to get a window at the appendiceal base, which  fortunately was viable.  I was able to transcend the appendix at its  takeoff off the cecum after properly identifying the terminal ileum, the  fold of Treves, and the cecum.  A 45-mm stapler was used to clamp for a  minute, fired, held for 30 seconds, and released.  The appendix was  peeled off from the base towards the tip using careful blunt dissection,  as well as ultrasonic dissection to elevate the appendiceal mesentery  and carefully free that off.  It was placed inside an EndoCatch bag and  removed at the umbilical port.   Copious irrigation was done.  I ultimately did 7 liters total in all  four quadrants, especially interloop to help clean everything off.  Hemostasis was excellent at the end of the case.  The staple line was  inspected and I saw no evidence of any ischemia or bleeding.  Re-  inspection was made circumferentially and there was no evidence of any  bowel injury or other abnormality.  Because things had cleaned up and  washed out pretty well and there was no specific abscess cavity, I did  not leave a drain.  Capnoperitoneum was evacuated and ports were  removed.  The umbilical defect was  closed transversely using interrupted  0 Vicryl stitches.  The skin was closed using 4-0 Monocryl stitch.  Triple antibiotic cream and sterile dressings were applied.   The patient was extubated and sent to the recovery room in stable  addition.  I will keep him on at least 5 days of antibiotics and start  with IV pain control and nausea control.   I discussed postop care with  the patient's family just prior to surgery  and reinforced it right afterwards as well.  Questions were answered and  they expressed understanding and appreciation.      Adin Hector, MD  Electronically Signed     SCG/MEDQ  D:  06/21/2009  T:  06/22/2009  Job:  [id number]   cc:   Claris Gower, M.D.  Fax: [phone]   Rosetta Posner, M.D.  [address]  [place]  [place] [address]

## 2011-04-10 NOTE — Assessment & Plan Note (Signed)
OFFICE VISIT   SAMMER, SCAFIDI T  DOB:  07-02-1930                                       12/10/2008  [id number]   The patient presents today for continued follow-up of his asymptomatic  abdominal aortic aneurysm.  This had been found on screening and we are  following with yearly ultrasounds.  He has no new medical problems,  specifically no heart disease or other major medical difficulties.  He  does continue to smoke a pack a day and does not drink alcohol on a  regular basis, review of systems otherwise negative.   PHYSICAL EXAM:  A well-developed well-nourished white male appearing  stated age of 35.  Vital Signs:  Blood pressure 155/85, pulse 86,  respirations 18, temperature is 97.9.  He has 2+ radial pulses, 2+  femoral pulses, absent popliteal and distal pulses.  Abdominal Exam:  Reveals prominent aortic pulsation and no tenderness.   He underwent a repeat ultrasound today in our office and this shows a  slight increase in size of his aneurysm up to a maximum of 4.9 cm.  I  discussed this at length with the patient.  We will see him again in 6  months with serial ultrasound follow-up.  He again reviewed symptoms of  leaking aneurysm with him and he will report immediately to the  operating room should this occur; otherwise, he will continue his usual  activities without restriction.  We will see him again in 6 months.   Rosetta Posner, M.D.  Electronically Signed   TFE/MEDQ  D:  12/10/2008  T:  12/13/2008  Job:  [id number]

## 2011-04-10 NOTE — Assessment & Plan Note (Signed)
OFFICE VISIT   Marc [name], Marc [name]  DOB:  [birth year]                                       12/10/2008  [id number]   The patient presents today for continued follow-up of his abdominal  aortic aneurysm.  This was found initially on a screening study and he  has had several serial follow-ups since that time.  He denies any new  medical difficulties, specifically any cardiac or pulmonary dysfunction.  He can smoke cigarettes 1 pack a day.  Review of systems otherwise  negative.   PHYSICAL EXAMINATION:  A well-developed well-nourished thin white male  appearing stated age of 51.  Blood pressure is 155/85, pulse 86,  respirations 18, temperature is 97.9.  General:  A well-developed well-  nourished white male.  His radial pulses are 2+, has 2+ femoral pulses,  absent popliteal and distal pulses.  Abdominal Exam:  Reveals moderate  aortic pulsation with no tenderness.   I discussed this at length with the patient, recommended that we see him  again in 6 months.  I explained that he has had some increase in size  from 4.5 to 4.9 cm.  I again reviewed symptoms of leaking aneurysm with  him.  He will present immediately to the [facility] Emergency Room  should this occur.  Otherwise, we will see him in 6 months with repeat  ultrasound.   Rosetta Posner, M.D.  Electronically Signed   TFE/MEDQ  D:  12/10/2008  [name]:  12/13/2008  Job:  [id number]   cc:   Claris Gower, M.D.

## 2011-04-10 NOTE — Procedures (Signed)
DUPLEX ULTRASOUND OF ABDOMINAL AORTA   INDICATION:  Followup abdominal aortic aneurysm.   HISTORY:  Diabetes:  No.  Cardiac:  No.  Hypertension:  Yes.  Smoking:  Yes.  Connective Tissue Disorder:  Family History:  No.  Previous Surgery:  Ruptured appendix.   DUPLEX EXAM:         AP (cm)                   TRANSVERSE (cm)  Proximal             2.57 cm                   2.07 cm  Mid                  5.32 cm                   5.03 cm  Distal               3.34 cm                   3.65 cm  Right Iliac          1.64 cm                   1.38 cm  Left Iliac           1.19 cm                   1.31 cm   PREVIOUS:  Date:  AP:  4.97  TRANSVERSE:  4.87   IMPRESSION:  1. Abdominal aortic aneurysm noted with largest measurement of 5.32 cm      x 5.03 cm.  2. Slight increase from previous study.   ___________________________________________  Rosetta Posner, M.D.   CB/MEDQ  D:  12/23/2009  T:  12/23/2009  Job:  [id number]

## 2011-04-10 NOTE — Procedures (Signed)
DUPLEX ULTRASOUND OF ABDOMINAL AORTA   INDICATION:  Followup abdominal aortic aneurysm.   HISTORY:  Diabetes:  No.  Cardiac:  No.  Hypertension:  Yes.  Smoking:  Yes.  Connective Tissue Disorder:  Family History:  No.  Previous Surgery:  No.   DUPLEX EXAM:         AP (cm)                   TRANSVERSE (cm)  Proximal             1.99 cm                   2.0 cm  Mid                  4.97 cm                   4.87 cm  Distal               3.86 cm                   3.60 cm  Right Iliac          1.83 cm                   1.87 cm  Left Iliac           1.34 cm                   1.31 cm   PREVIOUS:  Date:  11/30/2008  AP:  4.94  TRANSVERSE:  4.62   IMPRESSION:  1. Stable abdominal aortic aneurysm with largest measurement of 4.97      cm x 4.87 cm.  2. Known ectatic right common iliac artery with largest measurement of      1.83 cm x 1.87 cm compared to left at 1.34 cm x 1.31 cm.   ___________________________________________  Rosetta Posner, M.D.   AS/MEDQ  D:  06/10/2009  T:  06/10/2009  Job:  [id number]

## 2011-07-03 ENCOUNTER — Encounter: Payer: Self-pay | Admitting: Vascular Surgery

## 2011-07-04 ENCOUNTER — Ambulatory Visit (INDEPENDENT_AMBULATORY_CARE_PROVIDER_SITE_OTHER): Payer: Medicare Other | Admitting: Vascular Surgery

## 2011-07-04 ENCOUNTER — Encounter (INDEPENDENT_AMBULATORY_CARE_PROVIDER_SITE_OTHER): Payer: Medicare Other

## 2011-07-04 ENCOUNTER — Encounter: Payer: Self-pay | Admitting: Vascular Surgery

## 2011-07-04 VITALS — BP 143/82 | HR 83 | Resp 20 | Ht 67.5 in | Wt 165.0 lb

## 2011-07-04 DIAGNOSIS — I714 Abdominal aortic aneurysm, without rupture, unspecified: Secondary | ICD-10-CM

## 2011-07-04 NOTE — Progress Notes (Signed)
Subjective:     Patient ID: Marc [name], male   DOB: January 25, 1930, 77 y.o.   MRN: [id number]  HPI The patient presents today for followup of this asymptomatic abdominal aortic aneurysm. 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 This was in June of 2012. He is able to care for himself but has diminished stamina. He has no symptoms referable to his aneurysm. He has no new cardiac difficulties.  Review of Systems No change.    Past Medical History  Diagnosis Date  . AAA (abdominal aortic aneurysm)   . Hypertension   . Hyperlipidemia   . GERD (gastroesophageal reflux disease)   . Hiatal hernia   . Arthritis   . Stroke 2012    affecting RUE    History  Substance Use Topics  . Smoking status: Former Smoker -- 1.0 packs/day for 68 years    Types: Cigarettes    Quit date: 07/04/2011  . Smokeless tobacco: Never Used  . Alcohol Use: Yes     approximately 2 beers per day    Family History  Problem Relation Age of Onset  . Stroke Mother     Allergies not on file  Current outpatient prescriptions:Aspirin-Salicylamide-Caffeine (ARTHRITIS STRENGTH BC POWDER PO), Take by mouth.  , Disp: , Rfl: ;  Cholecalciferol (VITAMIN D) 1000 UNITS capsule, Take 1,000 Units by mouth daily.  , Disp: , Rfl: ;  clopidogrel (PLAVIX) 75 MG tablet, Take 75 mg by mouth daily.  , Disp: , Rfl: ;  aspirin 81 MG tablet, Take 81 mg by mouth daily.  , Disp: , Rfl:  Multiple Vitamin (MULTIVITAMIN) tablet, Take 1 tablet by mouth daily.  , Disp: , Rfl: ;  naproxen sodium (ANAPROX) 220 MG tablet, Take 220 mg by mouth 2 (two) times daily with a meal.  , Disp: , Rfl: ;  POTASSIUM GLUCONATE PO, Take by mouth.  , Disp: , Rfl: ;  ranitidine (ZANTAC) 150 MG capsule, Take 150 mg by mouth 2 (two) times daily.  , Disp: , Rfl: ;  vitamin E 1000 UNIT capsule, Take 1,000 Units by mouth daily.  , Disp: , Rfl:   Filed Vitals:   07/04/11 1049    Height: 5' 7.5" (1.715 m)  Weight: 165 lb (74.844 kg)    Body mass index is 25.46 kg/(m^2).       Objective:   Physical Exam Well-developed well-nourished white male in no acute distress. HEENT normal. Diminished grip strength right arm. 2+ radial and femoral pulses. Abdomen without pulsatile mass nontender aneurysm.  Duplex ultrasound: No change in maximal diameter of his abdominal aortic aneurysm. Maximal size of 5.3 cm.    Assessment:     Stable moderate sized abdominal aortic aneurysm    Plan:     I discussed options of operative treatment versus continued observation. Prior CAT scans suggest that he would be a stent graft candidate. He understands that his operative risk and risk for rupture are equal and therefore is comfortable with continued six-month followup in our noninvasive vascular lab.

## 2011-07-04 NOTE — Progress Notes (Signed)
6 month f/u AAA   Lab today

## 2011-09-10 NOTE — Procedures (Unsigned)
DUPLEX ULTRASOUND OF ABDOMINAL AORTA  INDICATION:  AAA.  HISTORY: Diabetes:  No. Cardiac:  No. Hypertension:  Yes. Smoking:  Yes. Connective Tissue Disorder: Family History:  No. Previous Surgery:  No.  DUPLEX EXAM:         AP (cm)                   TRANSVERSE (cm) Proximal             2.38 cm                   2.38 cm Mid                  4.97 cm                   5.35 cm Distal               Not visualized            Not visualized Right Iliac          Not visualized            Not visualized Left Iliac           Not visualized            Not visualized  PREVIOUS:  Date: 12/12/2010  AP:  4.9  TRANSVERSE:  5.3  IMPRESSION: 1. Limited visualization due to overlying bowel gas; patient had     cereal at 8:00 this morning. 2. Stable maximum diameter where the aorta was visualized.  ___________________________________________ Rosetta Posner, M.D.  EM/MEDQ  D:  07/05/2011  T:  07/05/2011  Job:  [id number]

## 2012-01-07 ENCOUNTER — Encounter: Payer: Self-pay | Admitting: Vascular Surgery

## 2012-01-08 ENCOUNTER — Encounter: Payer: Self-pay | Admitting: Vascular Surgery

## 2012-01-08 ENCOUNTER — Encounter (INDEPENDENT_AMBULATORY_CARE_PROVIDER_SITE_OTHER): Payer: Medicare Other | Admitting: *Deleted

## 2012-01-08 ENCOUNTER — Ambulatory Visit (INDEPENDENT_AMBULATORY_CARE_PROVIDER_SITE_OTHER): Payer: Medicare Other | Admitting: Vascular Surgery

## 2012-01-08 VITALS — BP 156/93 | HR 86 | Resp 16 | Ht 67.0 in | Wt 160.0 lb

## 2012-01-08 DIAGNOSIS — I714 Abdominal aortic aneurysm, without rupture, unspecified: Secondary | ICD-10-CM | POA: Insufficient documentation

## 2012-01-08 NOTE — Progress Notes (Signed)
[facility] [place]   Patient name: Marc [name] MRN: [id number] DOB: 1930/11/23 Sex: male   Referred by: Arelia Sneddon  Reason for referral:  Chief Complaint  Patient presents with  . AAA    6 month f/up with  Labs    HISTORY OF PRESENT ILLNESS: The patient remains asymptomatic from his abdominal aortic aneurysm. He has had no new strokes and does live independently at home with his wife.  Past Medical History  Diagnosis Date  . AAA (abdominal aortic aneurysm)   . Hypertension   . Hyperlipidemia   . GERD (gastroesophageal reflux disease)   . Hiatal hernia   . Arthritis   . Stroke 2012    affecting RUE    Past Surgical History  Procedure Date  . Appendectomy 07-2009    Ruptured appendix    History   Social History  . Marital Status: Married    Spouse Name: N/A    Number of Children: N/A  . Years of Education: N/A   Occupational History  . Not on file.   Social History Main Topics  . Smoking status: Former Smoker -- 1.0 packs/day for 68 years    Types: Cigarettes    Quit date: 07/04/2011  . Smokeless tobacco: Never Used  . Alcohol Use: Yes     approximately 2 beers per day  . Drug Use: No  . Sexually Active: Not on file   Other Topics Concern  . Not on file   Social History Narrative  . No narrative on file    Family History  Problem Relation Age of Onset  . Stroke Mother     Allergies as of 01/08/2012  . (Not on File)    Current Outpatient Prescriptions on File Prior to Visit  Medication Sig Dispense Refill  . aspirin 81 MG tablet Take 81 mg by mouth daily.        . Aspirin-Salicylamide-Caffeine (ARTHRITIS STRENGTH BC POWDER PO) Take by mouth.        . Cholecalciferol (VITAMIN D) 1000 UNITS capsule Take 1,000 Units by mouth daily.        . clopidogrel (PLAVIX) 75 MG tablet Take 75 mg by mouth daily.        . Multiple Vitamin (MULTIVITAMIN) tablet Take 1 tablet by mouth daily.        . naproxen sodium (ANAPROX) 220 MG  tablet Take 220 mg by mouth 2 (two) times daily with a meal.        . POTASSIUM GLUCONATE PO Take by mouth.        . ranitidine (ZANTAC) 150 MG capsule Take 150 mg by mouth 2 (two) times daily.        . vitamin E 1000 UNIT capsule Take 1,000 Units by mouth daily.           REVIEW OF SYSTEMS:  No change  PHYSICAL EXAMINATION:  General: The patient is a well-nourished male, in no acute distress. Vital signs are BP 156/93  Pulse 86  Resp 16  Ht 5\' 7"  (1.702 m)  Wt 160 lb (72.576 kg)  BMI 25.06 kg/m2  SpO2 100% Pulmonary: There is a good air exchange bilaterally without wheezing or rales. Abdomen: Soft and non-tender with normal pitch bowel sounds. He does have a palpable nontender abdominal aortic aneurysm  Musculoskeletal: There are no major deformities.  There is no significant extremity pain. Neurologic: No focal weakness or paresthesias are detected, Skin: There are no ulcer or rashes noted. Psychiatric:  The patient has normal affect. Cardiovascular: There is a regular rate and rhythm without significant murmur appreciated.   [facility] Vascular Lab Studies:  Ordered and Independently Reviewed light increase in size of his abdominal aortic aneurysm. Maximal diameter is 5.5 cm up from 5.3 cm in August 2012  Impression and Plan:  Asymptomatic abdominal aortic aneurysm. I discussed that this is at the threshold of repair. I have offered further evaluation versus continued 6 month followup. He reports his senses is not causing him any difficulty he wishes to consider continue close surveillance which I feel is appropriate. We will see him in 6 months with a CT scan for further evaluation    Atul Delucia [facility] [place]: [phone]

## 2012-01-16 NOTE — Procedures (Unsigned)
DUPLEX ULTRASOUND OF ABDOMINAL AORTA  INDICATION:  Followup AAA.  HISTORY: Diabetes:  no Cardiac:  no Hypertension:  yes Smoking:  yes Connective Tissue Disorder: Family History:  no Previous Surgery:  no  DUPLEX EXAM:         AP (cm)                   TRANSVERSE (cm) Proximal             3.44 cm                   3.44 cm Mid                  5.27 cm                   5.56 cm Distal               5.53 cm                   5.46 cm Right Iliac          1.15 cm                   1.31 cm Left Iliac           0.75 cm                   0.75 cm  PREVIOUS:  Date: 07/04/2011  AP:  4.97  TRANSVERSE:  5.35  IMPRESSION:  AAA noted today with largest diameter of 5.53 x 5.46 cm.  Measurement appears greater as compared to last exam, however visualization was limited due to overlying bowel gas at that time.  ___________________________________________ Rosetta Posner, M.D.  EM/MEDQ  D:  01/09/2012  T:  01/09/2012  Job:  [id number]

## 2012-03-17 ENCOUNTER — Other Ambulatory Visit: Payer: Self-pay | Admitting: *Deleted

## 2012-03-17 DIAGNOSIS — I714 Abdominal aortic aneurysm, without rupture, unspecified: Secondary | ICD-10-CM

## 2012-07-03 ENCOUNTER — Other Ambulatory Visit: Payer: Self-pay | Admitting: Vascular Surgery

## 2012-07-07 ENCOUNTER — Encounter: Payer: Self-pay | Admitting: Vascular Surgery

## 2012-07-08 ENCOUNTER — Ambulatory Visit
Admission: RE | Admit: 2012-07-08 | Discharge: 2012-07-08 | Disposition: A | Discharge status: Home / Self Care | Payer: Medicare Other | Source: Ambulatory Visit | Attending: Vascular Surgery | Admitting: Vascular Surgery

## 2012-07-08 ENCOUNTER — Ambulatory Visit (INDEPENDENT_AMBULATORY_CARE_PROVIDER_SITE_OTHER): Payer: Medicare Other | Admitting: Vascular Surgery

## 2012-07-08 ENCOUNTER — Encounter: Payer: Self-pay | Admitting: Vascular Surgery

## 2012-07-08 VITALS — BP 139/75 | HR 78 | Resp 16 | Ht 67.0 in | Wt 155.0 lb

## 2012-07-08 DIAGNOSIS — Z01810 Encounter for preprocedural cardiovascular examination: Secondary | ICD-10-CM

## 2012-07-08 DIAGNOSIS — I714 Abdominal aortic aneurysm, without rupture: Secondary | ICD-10-CM

## 2012-07-08 MED ORDER — IOHEXOL 350 MG/ML SOLN
100.0000 mL | Freq: Once | INTRAVENOUS | Status: AC | PRN
  Administered 2012-07-08: 100 mL via INTRAVENOUS

## 2012-07-08 NOTE — Progress Notes (Signed)
The patient presents today for continued followup of his infrarenal abdominal aortic aneurysm. Ultrasound 6 months ago had shown a 5.5 cm infrarenal abdominal aortic aneurysm. He had suffered a stroke proximal and 1 years prior to this and therefore we continue to observe this. He is here today with a CT angiogram from this morning. His only complaint is of chronic diarrhea and loose stools. He is taken over the counter medication for this. He is here today with his daughter. He denies any cardiac disease. He has no symptoms referable to his aneurysm.  Past Medical History  Diagnosis Date  . AAA (abdominal aortic aneurysm)   . Hypertension   . Hyperlipidemia   . GERD (gastroesophageal reflux disease)   . Hiatal hernia   . Arthritis   . Stroke 2012    affecting RUE    History  Substance Use Topics  . Smoking status: Current Everyday Smoker -- 1.0 packs/day for 68 years    Types: Cigarettes  . Smokeless tobacco: Never Used   Comment: pt states that he is trying to quit  . Alcohol Use: 1.8 oz/week    3 Cans of beer per week    Family History  Problem Relation Age of Onset  . Stroke Mother     No Known Allergies  Current outpatient prescriptions:aspirin 81 MG tablet, Take 81 mg by mouth daily.  , Disp: , Rfl: ;  Aspirin-Salicylamide-Caffeine (ARTHRITIS STRENGTH BC POWDER PO), Take by mouth.  , Disp: , Rfl: ;  Attapulgite (KAOPECTATE PO), Take by mouth daily., Disp: , Rfl: ;  Bismuth Subsalicylate (PEPTO-BISMOL PO), Take by mouth daily., Disp: , Rfl: ;  Cholecalciferol (VITAMIN D) 1000 UNITS capsule, Take 1,000 Units by mouth daily.  , Disp: , Rfl:  Loperamide HCl (IMODIUM A-D PO), Take by mouth 2 (two) times daily., Disp: , Rfl: ;  ranitidine (ZANTAC) 150 MG capsule, Take 150 mg by mouth 2 (two) times daily.  , Disp: , Rfl: ;  clopidogrel (PLAVIX) 75 MG tablet, Take 75 mg by mouth daily.  , Disp: , Rfl: ;  Multiple Vitamin (MULTIVITAMIN) tablet, Take 1 tablet by mouth daily.  , Disp: ,  Rfl:  naproxen sodium (ANAPROX) 220 MG tablet, Take 220 mg by mouth 2 (two) times daily with a meal.  , Disp: , Rfl: ;  POTASSIUM GLUCONATE PO, Take by mouth.  , Disp: , Rfl: ;  vitamin E 1000 UNIT capsule, Take 1,000 Units by mouth daily.  , Disp: , Rfl:  No current facility-administered medications for this visit. Facility-Administered Medications Ordered in Other Visits: iohexol (OMNIPAQUE) 350 MG/ML injection 100 mL, 100 mL, Intravenous, Once PRN, Medication Radiologist, MD, 100 mL at 07/08/12 1044  BP 139/75  Pulse 78  Resp 16  Ht 5\' 7"  (1.702 m)  Wt 155 lb (70.308 kg)  BMI 24.28 kg/m2  Body mass index is 24.28 kg/(m^2).       Review of systems is negative except for prior stroke and loose stools  Physical exam: Well-developed well-nourished white male in no acute distress Carotid arteries without bruits bilaterally Pulse status 2+ radial and 2+ femoral pulses with absent distal pulses Heart regular rate and rhythm without murmur Chest clear bilaterally without wheezes Abdomen soft nontender palpable abdominal aortic aneurysm which is nontender. No bruits heard Extremities without cyanosis or edema Neurologic he does have weakness on the right side versus the left  CT angiogram shows a maximal diameter of 6.0 cm. 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  Impression and plan: Continued expansion of asymptomatic infrarenal abdominal aortic aneurysm now to 6.0 cm. I explained to the patient and his daughter I would recommend repair. He does appear to be a candidate for stent graft and we will confirm this with further measurements. We will obtain cardiac clearance prior to surgery. We will schedule him once we have confirmed that he is a stent graft candidate I explained the procedure and  expected postoperative recovery and potential risks

## 2012-07-08 NOTE — Addendum Note (Signed)
Addended by: Mena Goes on: 07/08/2012 12:51 PM   Modules accepted: Orders

## 2012-07-10 ENCOUNTER — Ambulatory Visit (INDEPENDENT_AMBULATORY_CARE_PROVIDER_SITE_OTHER): Payer: Medicare Other | Admitting: Cardiovascular Disease

## 2012-07-10 ENCOUNTER — Encounter: Payer: Self-pay | Admitting: Cardiovascular Disease

## 2012-07-10 VITALS — BP 130/80 | HR 88 | Ht 67.5 in | Wt 156.0 lb

## 2012-07-10 DIAGNOSIS — I714 Abdominal aortic aneurysm, without rupture, unspecified: Secondary | ICD-10-CM

## 2012-07-10 DIAGNOSIS — Z01810 Encounter for preprocedural cardiovascular examination: Secondary | ICD-10-CM

## 2012-07-10 NOTE — Progress Notes (Signed)
Cherly Beach Hann[place] Date of Birth  02-20-1930       [facility]    [employer] [address], [address], [place], [place]  [address]   [place], [place]  [address] [phone]     [phone]   Fax  [phone]    Fax [phone]  Problem List: 1. Abdominal aortic aneurysm  . History of Present Illness: Mr. Ciak is an 78 year old gentleman who was recently found to have an abdominal aortic aneurysm. The aneurysm has now grown to be 6 cm and he needs to have surgical repair. He may be a candidate for stent graft.  He's not had any episodes of chest pain or shortness breath. He notes that he gets a little bit more fatigue now compared 2 years ago. He's able to do his routine yard work and housework without any complications.  He  Is able to climb 2 flights of stairs without any significant problems. He limits his salt intake. He's not had any leg edema. There is no PND or orthopnea.  Current Outpatient Prescriptions on File Prior to Visit  Medication Sig Dispense Refill  . aspirin 81 MG tablet Take 81 mg by mouth daily.        . Aspirin-Salicylamide-Caffeine (ARTHRITIS STRENGTH BC POWDER PO) Take by mouth.        . Attapulgite (KAOPECTATE PO) Take by mouth daily.      . Bismuth Subsalicylate (PEPTO-BISMOL PO) Take by mouth daily.      . Loperamide HCl (IMODIUM A-D PO) Take by mouth 2 (two) times daily.      . Multiple Vitamin (MULTIVITAMIN) tablet Take 1 tablet by mouth daily.        . ranitidine (ZANTAC) 150 MG capsule Take 150 mg by mouth 2 (two) times daily.          No Known Allergies  Past Medical History  Diagnosis Date  . AAA (abdominal aortic aneurysm)   . Hypertension   . Hyperlipidemia   . GERD (gastroesophageal reflux disease)   . Hiatal hernia   . Arthritis   . Stroke 2012    affecting RUE    Past Surgical History  Procedure Date  . Appendectomy 07-2009    Ruptured appendix    History  Smoking status  .  Current Everyday Smoker -- 1.0 packs/day for 68 years  . Types: Cigarettes  Smokeless tobacco  . Never Used  Comment: pt states that he is trying to quit    History  Alcohol Use  . 1.8 oz/week  . 3 Cans of beer per week   is now retired. He used to work in a Writer at [employer].  Family History  Problem Relation Age of Onset  . Stroke Mother     Reviw of Systems:  Reviewed in the HPI.  All other systems are negative.  Physical Exam: Blood pressure 130/80, pulse 88, height 5' 7.5" (1.715 m), weight 156 lb (70.761 kg). General: Well developed, well nourished, in no acute distress.  Head: Normocephalic, atraumatic, sclera non-icteric, mucus membranes are moist,   Neck: Supple. Carotids are 2 + without bruits. No JVD  Lungs: Clear bilaterally to auscultation.  Heart: regular rate.  normal  S1 S2. No murmurs, gallops or rubs.  Abdomen: Soft, non-tender, non-distended with normal bowel sounds. No hepatomegaly. No rebound/guarding. No masses.  Msk:  Strength and tone are normal  Extremities: No clubbing or cyanosis. No edema.  Distal  pedal pulses are 2+ and equal bilaterally.  Neuro: Alert and oriented X 3. Moves all extremities spontaneously.  Psych:  Responds to questions appropriately with a normal affect.  ECG: 07/10/2012-normal sinus rhythm at 80 beats a minute. Has left axis deviation.  Assessment / Plan:

## 2012-07-10 NOTE — Assessment & Plan Note (Signed)
Mr. Marc [name] has a 6 cm abdominal aortic aneurysm. Dr. Tawni Millers will be taking measurements to see if he is a candidate for stent graft procedure. We will go ahead and schedule him for a [place] study to make sure that we can provide him for operative closure if he needs that.  He does not have any angina or symptoms of congestive heart failure at present.

## 2012-07-10 NOTE — Patient Instructions (Addendum)
Your physician has requested that you have a lexiscan myoview. . Please follow instruction sheet, as given.  Your physician recommends that you schedule a follow-up appointment in: as needed per results of test, you will receive a call after test in 2-3 days to inform you of results, I will then send a cardiac clearance to Dr Early for AAA repair.

## 2012-07-17 ENCOUNTER — Ambulatory Visit (HOSPITAL_COMMUNITY): Payer: Medicare Other | Attending: Cardiology | Admitting: Radiology

## 2012-07-17 VITALS — BP 160/90 | Ht 67.5 in | Wt 155.0 lb

## 2012-07-17 DIAGNOSIS — I719 Aortic aneurysm of unspecified site, without rupture: Secondary | ICD-10-CM

## 2012-07-17 DIAGNOSIS — I714 Abdominal aortic aneurysm, without rupture, unspecified: Secondary | ICD-10-CM | POA: Insufficient documentation

## 2012-07-17 DIAGNOSIS — R0989 Other specified symptoms and signs involving the circulatory and respiratory systems: Secondary | ICD-10-CM | POA: Insufficient documentation

## 2012-07-17 DIAGNOSIS — R0609 Other forms of dyspnea: Secondary | ICD-10-CM | POA: Insufficient documentation

## 2012-07-17 DIAGNOSIS — I999 Unspecified disorder of circulatory system: Secondary | ICD-10-CM | POA: Insufficient documentation

## 2012-07-17 DIAGNOSIS — I2510 Atherosclerotic heart disease of native coronary artery without angina pectoris: Secondary | ICD-10-CM | POA: Insufficient documentation

## 2012-07-17 DIAGNOSIS — F17200 Nicotine dependence, unspecified, uncomplicated: Secondary | ICD-10-CM | POA: Insufficient documentation

## 2012-07-17 DIAGNOSIS — Z01810 Encounter for preprocedural cardiovascular examination: Secondary | ICD-10-CM

## 2012-07-17 DIAGNOSIS — R0602 Shortness of breath: Secondary | ICD-10-CM

## 2012-07-17 DIAGNOSIS — I10 Essential (primary) hypertension: Secondary | ICD-10-CM | POA: Insufficient documentation

## 2012-07-17 HISTORY — PX: CARDIOVASCULAR STRESS TEST: SHX262

## 2012-07-17 MED ORDER — TECHNETIUM TC 99M TETROFOSMIN IV KIT
30.0000 | PACK | Freq: Once | INTRAVENOUS | Status: AC | PRN
  Administered 2012-07-17: 30 via INTRAVENOUS

## 2012-07-17 MED ORDER — TECHNETIUM TC 99M TETROFOSMIN IV KIT
10.0000 | PACK | Freq: Once | INTRAVENOUS | Status: AC | PRN
  Administered 2012-07-17: 10 via INTRAVENOUS

## 2012-07-17 MED ORDER — REGADENOSON 0.4 MG/5ML IV SOLN
0.4000 mg | Freq: Once | INTRAVENOUS | Status: AC
  Administered 2012-07-17: 0.4 mg via INTRAVENOUS

## 2012-07-17 NOTE — Progress Notes (Signed)
Marc [name] [id number]  Cardiology Nuclear Med Study  Marc [name] is a 78 y.o. male     MRN : [id number]     DOB: [birth year]  Procedure Date: 07/17/2012  Nuclear Med Background Indication for Stress Test:  Evaluation for Ischemia and Pending Surgical Clearance for AAA Repair by Dr. Sherren Mocha Early History:  ~20 yrs ago GXT:OK per patient; 3/12 Echo:EF=60%; 4/13 CT:LM and 3-V CAD, AAA (6 cm), Pulmonary nodule 5 mm Cardiac Risk Factors: CVA, Hypertension, Lipids and Smoker  Symptoms:  DOE and Fatigue   Nuclear Pre-Procedure Caffeine/Decaff Intake:  None NPO After: 7:00pm   Lungs:  Clear. O2 Sat: 98% on room air. IV 0.9% NS with Angio Cath:  20g  IV Site: R Antecubital  IV Started by:  Eliezer Lofts, EMT-P  Chest Size (in):  40 Cup Size: n/a  Height: 5' 7.5" (1.715 m)  Weight:  155 lb (70.308 kg)  BMI:  Body mass index is 23.92 kg/(m^2). Tech Comments:  NA    Nuclear Med Study 1 or 2 day study: 1 day  Stress Test Type:  Lexiscan  Reading MD: Kirk Ruths, MD  Order Authorizing Provider:  Mertie Moores, MD  Resting Radionuclide: Technetium 46m Tetrofosmin  Resting Radionuclide Dose: 11.0 mCi   Stress Radionuclide:  Technetium 26m Tetrofosmin  Stress Radionuclide Dose: 33.0 mCi           Stress Protocol Rest HR: 73 Stress HR: 111  Rest BP: 160/90 Stress BP: 149/96  Exercise Time (min): n/a METS: n/a   Predicted Max HR: 140 bpm % Max HR: 79.29 bpm Rate Pressure Product: 16539   Dose of Adenosine (mg):  n/a Dose of Lexiscan: 0.4 mg  Dose of Atropine (mg): n/a Dose of Dobutamine: n/a mcg/kg/min (at max HR)  Stress Test Technologist: Letta Moynahan, CMA-N  Nuclear Technologist:  Charlton Amor, CNMT     Rest Procedure:  Myocardial perfusion imaging was performed at rest 45 minutes following the intravenous administration of Technetium 49m Tetrofosmin.  Rest ECG: No acute changes.  Stress  Procedure:  The patient received IV Lexiscan 0.4 mg over 15-seconds.  Technetium 37m Tetrofosmin injected at 30-seconds.  There were no significant changes with Lexiscan.  Quantitative spect images were obtained after a 45 minute delay.  Stress ECG: No significant ST segment change suggestive of ischemia.  QPS Raw Data Images:  Acquisition technically good; normal left ventricular size. Stress Images:  There is decreased uptake in the inferolateral wall. Rest Images:  Normal homogeneous uptake in all areas of the myocardium. Subtraction (SDS):  These findings are consistent with ischemia. Transient Ischemic Dilatation (Normal <1.22):  0.88 Lung/Heart Ratio (Normal <0.45):  0.31  Quantitative Gated Spect Images QGS EDV:  48 ml QGS ESV:  16 ml  Impression Exercise Capacity:  Lexiscan with no exercise. BP Response:  Normal blood pressure response. Clinical Symptoms:  No chest pain or dyspnea. ECG Impression:  No significant ST segment change suggestive of ischemia. Comparison with Prior Nuclear Study: No significant change from previous study.  Overall Impression:  Abnormal stress nuclear study with a moderate size, medium intensity, reversible inferolateral defect consistent with mild to moderate ischemia.  LV Ejection Fraction: 66%.  LV Wall Motion:  NL LV Function; NL Wall Motion

## 2012-07-22 ENCOUNTER — Encounter: Payer: Self-pay | Admitting: *Deleted

## 2012-07-22 ENCOUNTER — Telehealth: Payer: Self-pay | Admitting: *Deleted

## 2012-07-22 DIAGNOSIS — Z01818 Encounter for other preprocedural examination: Secondary | ICD-10-CM

## 2012-07-22 DIAGNOSIS — R9439 Abnormal result of other cardiovascular function study: Secondary | ICD-10-CM

## 2012-07-22 NOTE — Telephone Encounter (Signed)
Message copied by Jonathon Jordan on Tue Jul 22, 2012 10:14 AM ------      Message from: Thayer Headings      Created: Mon Jul 21, 2012  8:23 AM       Abnormal stress myoview. Will need a cath prior to AAA surgery.  Would recommend radial cath.  Set up for this week with cooper, [place], or [employer]

## 2012-07-22 NOTE — Telephone Encounter (Signed)
LHC and lab date set, pt will pick up instructions when gets labs drawn tomorrow, pt agreed to plan.

## 2012-07-23 ENCOUNTER — Other Ambulatory Visit (INDEPENDENT_AMBULATORY_CARE_PROVIDER_SITE_OTHER): Payer: Medicare Other

## 2012-07-23 DIAGNOSIS — I714 Abdominal aortic aneurysm, without rupture: Secondary | ICD-10-CM

## 2012-07-23 DIAGNOSIS — Z01818 Encounter for other preprocedural examination: Secondary | ICD-10-CM

## 2012-07-23 DIAGNOSIS — R9439 Abnormal result of other cardiovascular function study: Secondary | ICD-10-CM

## 2012-07-23 LAB — PROTIME-INR: Prothrombin Time: 11.6 s (ref 10.2–12.4)

## 2012-07-23 LAB — CBC WITH DIFFERENTIAL/PLATELET
Basophils Relative: 0.4 % (ref 0.0–3.0)
Eosinophils Absolute: 0.2 10*3/uL (ref 0.0–0.7)
HCT: 40 % (ref 39.0–52.0)
Hemoglobin: 12.9 g/dL — ABNORMAL LOW (ref 13.0–17.0)
Lymphocytes Relative: 12.6 % (ref 12.0–46.0)
Lymphs Abs: 1 10*3/uL (ref 0.7–4.0)
MCHC: 32.4 g/dL (ref 30.0–36.0)
Neutro Abs: 6.2 10*3/uL (ref 1.4–7.7)
RBC: 4.34 Mil/uL (ref 4.22–5.81)

## 2012-07-23 LAB — BASIC METABOLIC PANEL
BUN: 23 mg/dL (ref 6–23)
CO2: 27 mEq/L (ref 19–32)
Chloride: 106 mEq/L (ref 96–112)
Creatinine, Ser: 1.4 mg/dL (ref 0.4–1.5)

## 2012-07-24 ENCOUNTER — Other Ambulatory Visit: Payer: Self-pay | Admitting: Cardiovascular Disease

## 2012-07-24 ENCOUNTER — Telehealth: Payer: Self-pay | Admitting: Cardiovascular Disease

## 2012-07-24 DIAGNOSIS — R9439 Abnormal result of other cardiovascular function study: Secondary | ICD-10-CM

## 2012-07-24 NOTE — Telephone Encounter (Signed)
Kim with cath lab at [facility] calling needing orders on pt of nahser's , has cardiac cath scheduled for tomorrow, pls call asap

## 2012-07-25 ENCOUNTER — Encounter (HOSPITAL_BASED_OUTPATIENT_CLINIC_OR_DEPARTMENT_OTHER)
Admission: RE | Disposition: A | Discharge status: Home / Self Care | Payer: Self-pay | Source: Ambulatory Visit | Attending: Cardiovascular Disease

## 2012-07-25 ENCOUNTER — Inpatient Hospital Stay (HOSPITAL_BASED_OUTPATIENT_CLINIC_OR_DEPARTMENT_OTHER)
Admission: RE | Admit: 2012-07-25 | Discharge: 2012-07-25 | Disposition: A | Discharge status: Home / Self Care | Payer: Medicare Other | Source: Ambulatory Visit | Attending: Cardiovascular Disease | Admitting: Cardiovascular Disease

## 2012-07-25 DIAGNOSIS — R9439 Abnormal result of other cardiovascular function study: Secondary | ICD-10-CM

## 2012-07-25 DIAGNOSIS — I2510 Atherosclerotic heart disease of native coronary artery without angina pectoris: Secondary | ICD-10-CM

## 2012-07-25 DIAGNOSIS — I6990 Unspecified sequelae of unspecified cerebrovascular disease: Secondary | ICD-10-CM | POA: Insufficient documentation

## 2012-07-25 DIAGNOSIS — I10 Essential (primary) hypertension: Secondary | ICD-10-CM | POA: Insufficient documentation

## 2012-07-25 DIAGNOSIS — I714 Abdominal aortic aneurysm, without rupture, unspecified: Secondary | ICD-10-CM | POA: Insufficient documentation

## 2012-07-25 DIAGNOSIS — F430 Acute stress reaction: Secondary | ICD-10-CM | POA: Insufficient documentation

## 2012-07-25 SURGERY — JV LEFT HEART CATHETERIZATION WITH CORONARY ANGIOGRAM
Anesthesia: Moderate Sedation

## 2012-07-25 MED ORDER — ASPIRIN 81 MG PO CHEW: 324.0000 mg | CHEWABLE_TABLET | ORAL | Status: DC

## 2012-07-25 MED ORDER — ONDANSETRON HCL 4 MG/2ML IJ SOLN: 4.0000 mg | Freq: Four times a day (QID) | INTRAMUSCULAR | Status: DC | PRN

## 2012-07-25 MED ORDER — SODIUM CHLORIDE 0.9 % IV SOLN: INTRAVENOUS | Status: DC

## 2012-07-25 MED ORDER — SODIUM CHLORIDE 0.9 % IJ SOLN: 3.0000 mL | Freq: Two times a day (BID) | INTRAMUSCULAR | Status: DC

## 2012-07-25 MED ORDER — SODIUM CHLORIDE 0.9 % IJ SOLN: 3.0000 mL | INTRAMUSCULAR | Status: DC | PRN

## 2012-07-25 MED ORDER — ACETAMINOPHEN 325 MG PO TABS: 650.0000 mg | ORAL_TABLET | ORAL | Status: DC | PRN

## 2012-07-25 MED ORDER — SODIUM CHLORIDE 0.9 % IV SOLN: 250.0000 mL | INTRAVENOUS | Status: DC | PRN

## 2012-07-25 NOTE — OR Nursing (Signed)
Negative Allen's test right hand 

## 2012-07-25 NOTE — OR Nursing (Signed)
Tegaderm dressing applied, site level 0, bedrest begins at 0905

## 2012-07-25 NOTE — H&P (View-Only) (Signed)
Cherly Beach Oberholzer Date of Birth  1930/11/25       [facility]    [employer] [address] [place], [place], [place]  [address]   [place], [place]  [address] [phone]     [phone]   Fax  [phone]    Fax [phone]  Problem List: 1. Abdominal aortic aneurysm  . History of Present Illness: Mr. Madden is an 78 year old gentleman who was recently found to have an abdominal aortic aneurysm. The aneurysm has now grown to be 6 cm and he needs to have surgical repair. He may be a candidate for stent graft.  He's not had any episodes of chest pain or shortness breath. He notes that he gets a little bit more fatigue now compared 2 years ago. He's able to do his routine yard work and housework without any complications.  He  Is able to climb 2 flights of stairs without any significant problems. He limits his salt intake. He's not had any leg edema. There is no PND or orthopnea.  Current Outpatient Prescriptions on File Prior to Visit  Medication Sig Dispense Refill  . aspirin 81 MG tablet Take 81 mg by mouth daily.        . Aspirin-Salicylamide-Caffeine (ARTHRITIS STRENGTH BC POWDER PO) Take by mouth.        . Attapulgite (KAOPECTATE PO) Take by mouth daily.      . Bismuth Subsalicylate (PEPTO-BISMOL PO) Take by mouth daily.      . Loperamide HCl (IMODIUM A-D PO) Take by mouth 2 (two) times daily.      . Multiple Vitamin (MULTIVITAMIN) tablet Take 1 tablet by mouth daily.        . ranitidine (ZANTAC) 150 MG capsule Take 150 mg by mouth 2 (two) times daily.          No Known Allergies  Past Medical History  Diagnosis Date  . AAA (abdominal aortic aneurysm)   . Hypertension   . Hyperlipidemia   . GERD (gastroesophageal reflux disease)   . Hiatal hernia   . Arthritis   . Stroke 2012    affecting RUE    Past Surgical History  Procedure Date  . Appendectomy 07-2009    Ruptured appendix    History  Smoking status  .  Current Everyday Smoker -- 1.0 packs/day for 68 years  . Types: Cigarettes  Smokeless tobacco  . Never Used  Comment: pt states that he is trying to quit    History  Alcohol Use  . 1.8 oz/week  . 3 Cans of beer per week   is now retired. He used to work in a Writer at [employer].  Family History  Problem Relation Age of Onset  . Stroke Mother     Reviw of Systems:  Reviewed in the HPI.  All other systems are negative.  Physical Exam: Blood pressure 130/80, pulse 88, height 5' 7.5" (1.715 m), weight 156 lb (70.761 kg). General: Well developed, well nourished, in no acute distress.  Head: Normocephalic, atraumatic, sclera non-icteric, mucus membranes are moist,   Neck: Supple. Carotids are 2 + without bruits. No JVD  Lungs: Clear bilaterally to auscultation.  Heart: regular rate.  normal  S1 S2. No murmurs, gallops or rubs.  Abdomen: Soft, non-tender, non-distended with normal bowel sounds. No hepatomegaly. No rebound/guarding. No masses.  Msk:  Strength and tone are normal  Extremities: No clubbing or cyanosis. No edema.  Distal  pedal pulses are 2+ and equal bilaterally.  Neuro: Alert and oriented X 3. Moves all extremities spontaneously.  Psych:  Responds to questions appropriately with a normal affect.  ECG: 07/10/2012-normal sinus rhythm at 80 beats a minute. Has left axis deviation.  Assessment / Plan:

## 2012-07-25 NOTE — CV Procedure (Signed)
    Cardiac Cath Note  Marc [name] [id number] 03-23-1930  Procedure: left Heart Cardiac Catheterization Note Indications: abnormal stress, AAA, pre-op evaluation  Procedure Details Consent: Obtained Time Out: Verified patient identification, verified procedure, site/side was marked, verified correct patient position, special equipment/implants available, Radiology Safety Procedures followed,  medications/allergies/relevent history reviewed, required imaging and test results available.  Performed   Medications: Fentanyl: 25 mcg IV Versed: 2 mg IV  The right femoral artery was easily canulated using a modified Seldinger technique.  We had some difficulty in getting up past his AAA.  We were able to transverse the AAA using a glide wire and a 3DRC catheter.  All catheters were exchanged over a long J wire following this.  Hemodynamics:   LV pressure: 151/15 Aortic pressure: 156/70  Angiography   Left Main: large. Mildly calcified.  No significant stenosis  Left anterior Descending: moderately calcified.  There is a 40% proximal stenosis.  The mid vessel is diffusely disease between 40-50%.  There is a long 60-70% stenosis just after the take off of the 2 diagonal vessels.  The distal LAD is relatively free of disease.  The 1st diag is a moderate - large branch with a long 85-90% stenosis in the proximal segment.  The diagonal is a fairly good target for revascularization.  The 2nd diag  Is a small branch and is severely disease.  It is probably too small to be a CABG target.  Left Circumflex: moderately calcified.  Large.  The 1st OM is small - moderate in size.  The 2nd OM is occluded and fills late via left to left collaterals.  The distal LCx has a 95% stenosis in the PLSA.  Right Coronary Artery: large and dominant.  It is moderately calcified.   There is a proximal 50-60% stenosis in the proximal vessel followed by a tight 90% stenosis in the midsegment.  The distal RCA has a  long stenosis that varies between 70% and 90% in severity.  The PCA is a moderate sized branch without significant disease.  LV Gram: overall well preserved LV function .  Injected  diluted contrast given his renal insufficiency  Complications: No apparent complications Patient did tolerate procedure well.  Contrast used: 65 cc  Conclusions:   1. Severe 3 vessel CAD.  We will need to consult the cardiac surgeons prior to his AAA surgery. 2. Well preserved LV function.  Thayer Headings, Brooke Bonito., MD, [facility] 07/25/2012, 8:52 AM Office - [phone] Pager [phone]

## 2012-07-25 NOTE — OR Nursing (Signed)
Dr Nahser at bedside to discuss results and treatment plan with pt and family 

## 2012-07-25 NOTE — Interval H&P Note (Signed)
History and Physical Interval Note:  07/25/2012 8:11 AM  Marc [name]  has presented today for surgery, with the diagnosis of cp  The various methods of treatment have been discussed with the patient and family. After consideration of risks, benefits and other options for treatment, the patient has consented to  Procedure(s) (LRB): JV LEFT HEART CATHETERIZATION WITH CORONARY ANGIOGRAM (N/A) as a surgical intervention .  The patient's history has been reviewed, patient examined, no change in status, stable for surgery.  I have reviewed the patient's chart and labs.  Questions were answered to the patient's satisfaction.     Darden Amber.

## 2012-07-25 NOTE — OR Nursing (Signed)
Discharge instructions reviewed and signed, pt stated understanding, ambulated in hall without difficulty, site level 0, transported to daughter's car via wheelchair 

## 2012-07-30 ENCOUNTER — Encounter: Payer: Medicare Other | Admitting: Cardiothoracic Surgery

## 2012-08-01 ENCOUNTER — Other Ambulatory Visit: Payer: Self-pay | Admitting: *Deleted

## 2012-08-01 ENCOUNTER — Institutional Professional Consult (permissible substitution) (INDEPENDENT_AMBULATORY_CARE_PROVIDER_SITE_OTHER): Payer: Medicare Other | Admitting: Cardiothoracic Surgery

## 2012-08-01 ENCOUNTER — Other Ambulatory Visit: Payer: Self-pay | Admitting: Cardiothoracic Surgery

## 2012-08-01 ENCOUNTER — Encounter (HOSPITAL_COMMUNITY): Payer: Self-pay | Admitting: Pharmacy Technician

## 2012-08-01 VITALS — BP 131/84 | HR 113 | Resp 16 | Ht 67.5 in | Wt 156.0 lb

## 2012-08-01 DIAGNOSIS — I2510 Atherosclerotic heart disease of native coronary artery without angina pectoris: Secondary | ICD-10-CM

## 2012-08-01 DIAGNOSIS — F17200 Nicotine dependence, unspecified, uncomplicated: Secondary | ICD-10-CM

## 2012-08-01 DIAGNOSIS — I714 Abdominal aortic aneurysm, without rupture: Secondary | ICD-10-CM

## 2012-08-01 DIAGNOSIS — Z8673 Personal history of transient ischemic attack (TIA), and cerebral infarction without residual deficits: Secondary | ICD-10-CM

## 2012-08-01 NOTE — Patient Instructions (Addendum)
STOP Smoking Stop BC Powders  Coronary Artery Disease Coronary artery disease (CAD) happens when the arteries of the heart become narrow and hardened. Buildup of plaque in the walls of the vessels blocks blood flow. This can cause minor chest pain (angina) if the blockage is partial. A heart attack or MI (myocardial infarction) happens when the artery is completely blocked. MIs can lead to shock, heart failure, and sudden death. CAD is the leading cause of death in men and women.   CAUSES   The risk for getting CAD can be inherited. There are other risk factors that can be helped. These factors include:  Cigarette smoking.   Diabetes.   Cholesterol.   High blood pressure.  Being overweight and not exercising regularly also increase your risk for having CAD.   SYMPTOMS  Chest pain.   Shortness of breath.   Weakness.   Nausea.   Fainting.  DIAGNOSIS   The diagnosis may require:  An EKG.   Stress test.   Chest X-ray.   Cardiac scan.   Echocardiogram.   A coronary angiogram.  TREATMENT   Treatment includes immediate measures for symptom relief. Medicines for chest pain, cholesterol reduction, blood pressure control, and aspirin to prevent clotting may be needed. Coronary angioplasty (using a balloon to open a blockage in a coronary artery) is helpful in managing MIs. It is also useful for some patients with angina. Losing weight, controlling the blood sugar, and not smoking are also important to long-term success. The optimal diet should emphasize fruits and vegetables. Sodas, deep-fried foods, and sweets should be avoided.   SEEK IMMEDIATE MEDICAL CARE IF:  You develop severe chest pain or pain that does not improve with rest and medicine.   You develop pain that radiates into the arms, neck, jaw, or back.   You develop fainting, shortness of breath, severe palpitations, or vomiting.   You start sweating a lot.  Document Released: 12/20/2004 Document Revised:  11/01/2011 Document Reviewed: 11/10/2008 [facility] Patient Information 2012 [place] .Coronary Artery Bypass Grafting Coronary artery bypass grafting (CABG) is done to bypass or fix arteries of the heart (coronary) that have become narrow or blocked. This is usually the result of plaque built up in the walls of the vessels. The coronary arteries supply the heart with the oxygen and nutrients it needs to pump blood to your body. The heart never rests and needs constant blood flow. If an artery is partially blocked, you may have chest pain (angina). Lack of blood flow to part of the heart muscle may cause that part to die. This is what happens in a heart attack (myocardial infarction). Reasons for CABG include:  Arteries that cannot be treated with medications or other interventions (such as a heart stent).   Severe angina not responsive to other treatment.   Improving heart function.   Treating a heart attack.  Every person is unique. Be sure you understand the risks and benefits of CABG.  RISKS AND COMPLICATIONS Your surgeon will discuss these with you. Your risks will be different depending on your past and present health and other factors. It may be helpful to have a family member or advocate with you so you feel free to ask questions and get the answers you need to give an informed consent. Possible problems of CABG surgery include:  Blood loss and replacement.   Stroke.   Infection.   Surgical site pain.   Heart attack during or after.   Kidney  failure.  BEFORE THE PROCEDURE  Tell your doctor about any allergies, medicines, bleeding problems and other surgeries.   Take all medicines exactly as directed. You may start new medicines and stop taking others. Do not stop medications or adjust dosages on your own. Continue taking the medications up until the time of surgery.   Perform only activities that are suggested by your caregiver.   If you are overweight, you should  try to lose weight. Eat a heart-healthy diet that is low in fat and salt.   If you smoke, quit.   Let your caregiver know if you have been on steroids (including creams or drops) for long periods of time. This is critical.   If you are diabetic discuss with your doctor whether or not you should take insulin the day of your surgery.  DAY OF SURGERY:  Plan to arrive 60 minutes before the scheduled time or as directed.   Do not eat or drink anything, including medicine, unless directed.   You will sign a written consent. You may have blood work and other tests done.   Your family will be shown where they can wait for the surgeon to talk to them when the surgery is over.  PROCEDURE Only a specially trained surgeon does a CABG assisted by a team of other health care professionals. You will be asleep and not feel any discomfort during the surgery.    Traditional method:   A cut (incision) is made down the front of the chest through the breastbone (sternum).   The sternum is spread open so the surgeon can see your heart.   You are connected to a machine that does the work of your heart and lungs and your heart stops beating.   Veins are taken from your leg(s) and used to bypass the blocked arteries of your heart. Sometimes an artery from inside your chest wall is used, either by itself or along with leg veins.   When the bypasses are done, you are taken off the machine, and your heart is restarted and takes over again.   Alternate methods:   The heart lung machine is not used. This is called off-pump. Your heart continues to beat while the bypasses are done.   Smaller incisions are used instead of going through the middle of the chest (minimally invasive).   Intended to reduce pain and promote a faster recovery.  AFTER THE PROCEDURE  You may wake up with a tube in your throat to help your breathing. You may be connected to a breathing machine.   You will not be able to talk while  the tube is in place. Try not to fight against it. The tube will be taken out as soon as it is safe.   Even if you cannot see them, there are nurses nearby who are watching everything. You are not alone.   Your family should be prepared to see you with many tubes and wires. You will be sleepy and pale. Your family can hold your hand and speak to you, but you may have no memory of this time.  SEEK IMMEDIATE MEDICAL CARE IF:  You have severe chest pain, especially if the pain is crushing or pressure-like and spreads to the arms, back, neck, or jaw, or if you have sweating, feel sick to your stomach (nausea), or shortness of breath. THIS IS AN EMERGENCY. Do not wait to see if the pain will go away. Get medical help at once. Call your  local emergency services (911 in [place] DO NOT drive yourself to the hospital.   You have an attack of chest pain lasting longer than usual, despite rest and treatment with the medications your doctor has prescribed.   You wake from sleep with chest pain.   You feel dizzy or faint.  Document Released: 08/22/2005 Document Revised: 11/01/2011 Document Reviewed: 04/28/2008 [facility] Patient Information 2012 [place].  Coronary Artery Bypass Grafting Care After Refer to this sheet in the next few weeks. These instructions provide you with information on caring for yourself after your procedure. Your caregiver may also give you more specific instructions. Your treatment has been planned according to current medical practices, but problems sometimes occur. Call your caregiver if you have any problems or questions after your procedure.   Recovery from open heart surgery will be different for everyone. Some people feel well after 3 or 4 weeks, while for others it takes longer. After heart surgery, it may be normal to:  Not have an appetite, feel nauseated by the smell of food, or only want to eat a small amount.   Be constipated because of changes in your diet,  activity, and medicines. Eat foods high in fiber. Add fresh fruits and vegetables to your diet. Stool softeners may be helpful.   Feel sad or unhappy. You may be frustrated or cranky. You may have good days and bad days. Do not give up. Talk to your caregiver if you do not feel better.   Feel weakness and fatigue. You many need physical therapy or cardiac rehabilitation to get your strength back.   Develop an irregular heartbeat called atrial fibrillation. Symptoms of atrial fibrillation are a fast, irregular heartbeat or feelings of fluttery heartbeats, shortness of breath, low blood pressure, and dizziness. If these symptoms develop, see your caregiver right away.  MEDICATION  Have a list of all the medicines you will be taking when you leave the hospital. For every medicine, know the following:   Name.   Exact dose.   Time of day to be taken.   How often it should be taken.   Why you are taking it.   Ask which medicines should or should not be taken together. If you take more than one heart medicine, ask if it is okay to take them together. Some heart medicines should not be taken at the same time because they may lower your blood pressure too much.   Narcotic pain medicine can cause constipation. Eat fresh fruits and vegetables. Add fiber to your diet. Stool softener medicine may help relieve constipation.   Keep a copy of your medicines with you at all times.   Do not add or stop taking any medicine until you check with your caregiver.   Medicines can have side effects. Call your caregiver who prescribed the medicine if you:   Start throwing up, have diarrhea, or have stomach pain.   Feel dizzy or lightheaded when you stand up.   Feel your heart is skipping beats or is beating too fast or too slow.   Develop a rash.   Notice unusual bruising or bleeding.  HOME CARE INSTRUCTIONS  After heart surgery, it is important to learn how to take your pulse. Have your caregiver  show you how to take your pulse.   Use your incentive spirometer. Ask your caregiver how long after surgery you need to use it.  Care of your chest incision  Tell your caregiver right away if you notice clicking  in your chest (sternum).   Support your chest with a pillow or your arms when you take deep breaths and cough.   Follow your caregiver's instructions about when you can bathe or swim.   Protect your incision from sunlight during the first year to keep the scar from getting dark.   Tell your caregiver if you notice:   Increased tenderness of your incision.   Increased redness or swelling around your incision.   Drainage or pus from your incision.  Care of your leg incision(s)  Avoid crossing your legs.   Avoid sitting for long periods of time. Change positions every half hour.   Elevate your leg(s) when you are sitting.   Check your leg(s) daily for swelling. Check the incisions for redness or drainage.   Wear your elastic stockings as told by your caregiver. Take them off at bedtime.  Diet  Diet is very important to heart health.   Eat plenty of fresh fruits and vegetables. Meats should be lean cut. Avoid canned, processed, and fried foods.   Talk to a dietician. They can teach you how to make healthy food and drink choices.  Weight  Weigh yourself every day. This is important because it helps to know if you are retaining fluid that may make your heart and lungs work harder.   Use the same scale each time.   Weigh yourself every morning at the same time. You should do this after you go to the bathroom, but before you eat breakfast.   Your weight will be more accurate if you do not wear any clothes.   Record your weight.   Tell your caregiver if you have gained 2 pounds or more overnight.  Activity Stop any activity at once if you have chest pain, shortness of breath, irregular heartbeats, or dizziness. Get help right away if you have any of these  symptoms.  Bathing.  Avoid soaking in a bath or hot tub until your incisions are healed.   Rest. You need a balance of rest and activity.   Exercise. Exercise per your caregiver's advice. You may need physical therapy or cardiac rehabilitation to help strengthen your muscles and build your endurance.   Climbing stairs. Unless your caregiver tells you not to climb stairs, go up stairs slowly and rest if you tire. Do not pull yourself up by the handrail.   Driving a car. Follow your caregiver's advice on when you may drive. You may ride as a passenger at any time. When traveling for long periods of time in a car, get out of the car and walk around for a few minutes every 2 hours.   Lifting. Avoid lifting, pushing, or pulling anything heavier than 10 pounds for 6 weeks after surgery or as told by your caregiver.   Returning to work. Check with your caregiver. People heal at different rates. Most people will be able to go back to work 6 to 12 weeks after surgery.   Sexual activity. You may resume sexual relations as told by your caregiver.  SEEK MEDICAL CARE IF:  Any of your incisions are red, painful, or have any type of drainage coming from them.   You have an oral temperature above 102 F (38.9 C).   You have ankle or leg swelling.   You have pain in your legs.   You have weight gain of 2 or more pounds a day.   You feel dizzy or lightheaded when you stand up.  SEEK IMMEDIATE  MEDICAL CARE IF:  You have angina or chest pain that goes to your jaw or arms. Call your local emergency services right away.   You have shortness of breath at rest or with activity.   You have a fast or irregular heartbeat (arrhythmia).   There is a "clicking" in your sternum when you move.   You have numbness or weakness in your arms or legs.  MAKE SURE YOU:  Understand these instructions.   Will watch your condition.   Will get help right away if you are not doing well or get worse.  Document  Released: 06/01/2005 Document Revised: 11/01/2011 Document Reviewed: 01/17/2011 [facility] Patient Information 2012 [place].

## 2012-08-04 ENCOUNTER — Encounter (HOSPITAL_COMMUNITY): Payer: Self-pay

## 2012-08-04 ENCOUNTER — Inpatient Hospital Stay (HOSPITAL_COMMUNITY)
Admission: RE | Admit: 2012-08-04 | Discharge: 2012-08-04 | Disposition: A | Discharge status: Home / Self Care | Payer: Medicare Other | Source: Ambulatory Visit | Attending: Cardiothoracic Surgery | Admitting: Cardiothoracic Surgery

## 2012-08-04 ENCOUNTER — Encounter (HOSPITAL_COMMUNITY)
Admission: RE | Admit: 2012-08-04 | Discharge: 2012-08-04 | Disposition: A | Discharge status: Home / Self Care | Payer: Medicare Other | Source: Ambulatory Visit | Attending: Cardiothoracic Surgery | Admitting: Cardiothoracic Surgery

## 2012-08-04 ENCOUNTER — Encounter: Payer: Self-pay | Admitting: Cardiothoracic Surgery

## 2012-08-04 ENCOUNTER — Ambulatory Visit (HOSPITAL_COMMUNITY)
Admission: RE | Admit: 2012-08-04 | Discharge: 2012-08-04 | Disposition: A | Discharge status: Home / Self Care | Payer: Medicare Other | Source: Ambulatory Visit | Attending: Cardiothoracic Surgery | Admitting: Cardiothoracic Surgery

## 2012-08-04 VITALS — BP 163/92 | HR 85 | Temp 97.7°F | Resp 18 | Ht 67.5 in | Wt 153.3 lb

## 2012-08-04 DIAGNOSIS — I2510 Atherosclerotic heart disease of native coronary artery without angina pectoris: Secondary | ICD-10-CM

## 2012-08-04 DIAGNOSIS — Z01810 Encounter for preprocedural cardiovascular examination: Secondary | ICD-10-CM

## 2012-08-04 DIAGNOSIS — Z8673 Personal history of transient ischemic attack (TIA), and cerebral infarction without residual deficits: Secondary | ICD-10-CM | POA: Insufficient documentation

## 2012-08-04 DIAGNOSIS — F17200 Nicotine dependence, unspecified, uncomplicated: Secondary | ICD-10-CM | POA: Insufficient documentation

## 2012-08-04 HISTORY — DX: Personal history of other diseases of the digestive system: Z87.19

## 2012-08-04 HISTORY — DX: Personal history of other infectious and parasitic diseases: Z86.19

## 2012-08-04 HISTORY — DX: Atherosclerotic heart disease of native coronary artery without angina pectoris: I25.10

## 2012-08-04 LAB — COMPREHENSIVE METABOLIC PANEL
ALT: 13 U/L (ref 0–53)
AST: 46 U/L — ABNORMAL HIGH (ref 0–37)
Albumin: 4 g/dL (ref 3.5–5.2)
Alkaline Phosphatase: 103 U/L (ref 39–117)
BUN: 21 mg/dL (ref 6–23)
CO2: 19 mEq/L (ref 19–32)
Calcium: 9.7 mg/dL (ref 8.4–10.5)
Chloride: 103 mEq/L (ref 96–112)
Creatinine, Ser: 1 mg/dL (ref 0.50–1.35)
GFR calc Af Amer: 80 mL/min — ABNORMAL LOW (ref >90)
GFR calc non Af Amer: 69 mL/min — ABNORMAL LOW (ref >90)
Glucose, Bld: 116 mg/dL — ABNORMAL HIGH (ref 70–99)
Potassium: 5.1 mEq/L (ref 3.5–5.1)
Sodium: 136 mEq/L (ref 135–145)
Total Bilirubin: 0.2 mg/dL — ABNORMAL LOW (ref 0.3–1.2)
Total Protein: 7.9 g/dL (ref 6.0–8.3)

## 2012-08-04 LAB — APTT: aPTT: 35 seconds (ref 24–37)

## 2012-08-04 LAB — BLOOD GAS, ARTERIAL
Acid-base deficit: 2.4 mmol/L — ABNORMAL HIGH (ref 0.0–2.0)
Bicarbonate: 21.5 mEq/L (ref 20.0–24.0)
Drawn by: 344381
FIO2: 0.21 %
O2 Saturation: 96.2 %
Patient temperature: 98.6
TCO2: 22.6 mmol/L (ref 0–100)
pCO2 arterial: 34.5 mmHg — ABNORMAL LOW (ref 35.0–45.0)
pH, Arterial: 7.411 (ref 7.350–7.450)
pO2, Arterial: 81.2 mmHg (ref 80.0–100.0)

## 2012-08-04 LAB — TYPE AND SCREEN
ABO/RH(D): O POS
Antibody Screen: NEGATIVE

## 2012-08-04 LAB — URINALYSIS, ROUTINE W REFLEX MICROSCOPIC
Bilirubin Urine: NEGATIVE
Glucose, UA: NEGATIVE mg/dL
Ketones, ur: NEGATIVE mg/dL
Nitrite: NEGATIVE
Protein, ur: NEGATIVE mg/dL
Specific Gravity, Urine: 1.019 (ref 1.005–1.030)
Urobilinogen, UA: 0.2 mg/dL (ref 0.0–1.0)
pH: 5.5 (ref 5.0–8.0)

## 2012-08-04 LAB — CBC
HCT: 40.5 % (ref 39.0–52.0)
Hemoglobin: 13.8 g/dL (ref 13.0–17.0)
MCH: 30.3 pg (ref 26.0–34.0)
MCHC: 34.1 g/dL (ref 30.0–36.0)
MCV: 89 fL (ref 78.0–100.0)
Platelets: 202 10*3/uL (ref 150–400)
RBC: 4.55 MIL/uL (ref 4.22–5.81)
RDW: 13.5 % (ref 11.5–15.5)
WBC: 8.5 10*3/uL (ref 4.0–10.5)

## 2012-08-04 LAB — URINE MICROSCOPIC-ADD ON

## 2012-08-04 LAB — PROTIME-INR
INR: 1.09 (ref 0.00–1.49)
Prothrombin Time: 14.3 seconds (ref 11.6–15.2)

## 2012-08-04 LAB — HEMOGLOBIN A1C
Hgb A1c MFr Bld: 5.7 % — ABNORMAL HIGH (ref <5.7)
Mean Plasma Glucose: 117 mg/dL — ABNORMAL HIGH (ref <117)

## 2012-08-04 LAB — SURGICAL PCR SCREEN
MRSA, PCR: POSITIVE — AB
Staphylococcus aureus: POSITIVE — AB

## 2012-08-04 LAB — ABO/RH: ABO/RH(D): O POS

## 2012-08-04 MED ORDER — ALBUTEROL SULFATE (5 MG/ML) 0.5% IN NEBU
2.5000 mg | INHALATION_SOLUTION | Freq: Once | RESPIRATORY_TRACT | Status: AC
  Administered 2012-08-04: 2.5 mg via RESPIRATORY_TRACT

## 2012-08-04 MED ORDER — SODIUM CHLORIDE 0.9 % IV SOLN
1.5000 mg/kg/h | INTRAVENOUS | Status: DC
  Filled 2012-08-04: qty 25

## 2012-08-04 MED ORDER — TRANEXAMIC ACID (OHS) BOLUS VIA INFUSION
15.0000 mg/kg | INTRAVENOUS | Status: DC
  Filled 2012-08-04: qty 1043

## 2012-08-04 MED ORDER — SODIUM CHLORIDE 0.9 % IV SOLN
1250.0000 mg | INTRAVENOUS | Status: AC
  Administered 2012-08-05: 1250 mg via INTRAVENOUS
  Filled 2012-08-04: qty 1250

## 2012-08-04 MED ORDER — PLASMA-LYTE 148 IV SOLN
INTRAVENOUS | Status: DC
  Filled 2012-08-04: qty 2.5

## 2012-08-04 MED ORDER — MAGNESIUM SULFATE 50 % IJ SOLN
40.0000 meq | INTRAMUSCULAR | Status: DC
  Filled 2012-08-04: qty 10

## 2012-08-04 MED ORDER — PHENYLEPHRINE HCL 10 MG/ML IJ SOLN
30.0000 ug/min | INTRAVENOUS | Status: DC
  Filled 2012-08-04: qty 2

## 2012-08-04 MED ORDER — DEXTROSE 5 % IV SOLN
750.0000 mg | INTRAVENOUS | Status: DC
  Filled 2012-08-04: qty 750

## 2012-08-04 MED ORDER — DEXMEDETOMIDINE HCL IN NACL 400 MCG/100ML IV SOLN
0.1000 ug/kg/h | INTRAVENOUS | Status: DC
  Filled 2012-08-04: qty 100

## 2012-08-04 MED ORDER — EPINEPHRINE HCL 1 MG/ML IJ SOLN
0.5000 ug/min | INTRAVENOUS | Status: DC
  Filled 2012-08-04: qty 4

## 2012-08-04 MED ORDER — DOPAMINE-DEXTROSE 3.2-5 MG/ML-% IV SOLN
2.0000 ug/kg/min | INTRAVENOUS | Status: DC
  Filled 2012-08-04: qty 250

## 2012-08-04 MED ORDER — DEXTROSE 5 % IV SOLN
1.5000 g | INTRAVENOUS | Status: AC
  Administered 2012-08-05: .75 g via INTRAVENOUS
  Administered 2012-08-05: 1.5 g via INTRAVENOUS
  Filled 2012-08-04 (×2): qty 1.5

## 2012-08-04 MED ORDER — SODIUM CHLORIDE 0.9 % IV SOLN
INTRAVENOUS | Status: DC
  Filled 2012-08-04: qty 1

## 2012-08-04 MED ORDER — TRANEXAMIC ACID (OHS) PUMP PRIME SOLUTION
2.0000 mg/kg | INTRAVENOUS | Status: DC
  Filled 2012-08-04: qty 1.39

## 2012-08-04 MED ORDER — NITROGLYCERIN IN D5W 200-5 MCG/ML-% IV SOLN
2.0000 ug/min | INTRAVENOUS | Status: DC
  Filled 2012-08-04: qty 250

## 2012-08-04 MED ORDER — POTASSIUM CHLORIDE 2 MEQ/ML IV SOLN
80.0000 meq | INTRAVENOUS | Status: DC
  Filled 2012-08-04: qty 40

## 2012-08-04 NOTE — Progress Notes (Signed)
VASCULAR LAB PRELIMINARY  PRELIMINARY  PRELIMINARY  PRELIMINARY  Pre-op Cardiac Surgery  Carotid Findings:  Bilateral:  No evidence of hemodynamically significant internal carotid artery stenosis.   Vertebral artery flow is antegrade.      Upper Extremity Right Left  Brachial Pressures 149 tri 153 tri  Radial Waveforms Triphasic  Triphasic   Ulnar Waveforms Triphasic  Triphasic   Palmar Arch (Allen's Test) obliterates with radial compression, normal with ulnar compression Within normal limits      Lower  Extremity Right Left  Dorsalis Pedis absent 98 mono  Anterior Tibial    Posterior Tibial 94 mono 95 mono  Ankle/Brachial Indices 0.61 0.64    Katora Fini, RVT 08/04/2012, 12:38 PM
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
## 2012-08-04 NOTE — Pre-Procedure Instructions (Signed)
[place]  08/04/2012   Your procedure is scheduled on:  September 10  Report to [facility] at 05:30 AM.  Call this number if you have problems the morning of surgery: [phone]   Remember:   Do not eat or drink:After Midnight.  Take these medicines the morning of surgery with A SIP OF WATER: Zantac   Do not wear jewelry, make-up or nail polish.  Do not wear lotions, powders, or perfumes. You may wear deodorant.  Do not shave 48 hours prior to surgery. Men may shave face and neck.  Do not bring valuables to the hospital.  Contacts, dentures or bridgework may not be worn into surgery.  Leave suitcase in the car. After surgery it may be brought to your room.  For patients admitted to the hospital, checkout time is 11:00 AM the day of discharge.   Special Instructions: Incentive Spirometry - Practice and bring it with you on the day of surgery. and CHG Shower Use Special Wash: 1/2 bottle night before surgery and 1/2 bottle morning of surgery.   Please read over the following fact sheets that you were given: Pain Booklet, Coughing and Deep Breathing, Blood Transfusion Information and Open Heart Packet

## 2012-08-05 ENCOUNTER — Ambulatory Visit (HOSPITAL_COMMUNITY): Payer: Medicare Other | Admitting: Anesthesiology

## 2012-08-05 ENCOUNTER — Encounter (HOSPITAL_COMMUNITY)
Admission: RE | Disposition: A | Discharge status: Home / Self Care | Payer: Self-pay | Source: Ambulatory Visit | Attending: Cardiothoracic Surgery

## 2012-08-05 ENCOUNTER — Encounter (HOSPITAL_COMMUNITY): Payer: Self-pay | Admitting: *Deleted

## 2012-08-05 ENCOUNTER — Inpatient Hospital Stay (HOSPITAL_COMMUNITY): Payer: Medicare Other

## 2012-08-05 ENCOUNTER — Inpatient Hospital Stay (HOSPITAL_COMMUNITY)
Admission: RE | Admit: 2012-08-05 | Discharge: 2012-08-10 | DRG: 236 | Disposition: A | Discharge status: Home / Self Care | Payer: Medicare Other | Source: Ambulatory Visit | Attending: Cardiothoracic Surgery | Admitting: Cardiothoracic Surgery

## 2012-08-05 ENCOUNTER — Encounter (HOSPITAL_COMMUNITY): Payer: Self-pay | Admitting: Anesthesiology

## 2012-08-05 DIAGNOSIS — Z79899 Other long term (current) drug therapy: Secondary | ICD-10-CM

## 2012-08-05 DIAGNOSIS — D62 Acute posthemorrhagic anemia: Secondary | ICD-10-CM | POA: Diagnosis not present

## 2012-08-05 DIAGNOSIS — E119 Type 2 diabetes mellitus without complications: Secondary | ICD-10-CM | POA: Diagnosis present

## 2012-08-05 DIAGNOSIS — Z01812 Encounter for preprocedural laboratory examination: Secondary | ICD-10-CM

## 2012-08-05 DIAGNOSIS — E785 Hyperlipidemia, unspecified: Secondary | ICD-10-CM | POA: Diagnosis present

## 2012-08-05 DIAGNOSIS — Z8673 Personal history of transient ischemic attack (TIA), and cerebral infarction without residual deficits: Secondary | ICD-10-CM

## 2012-08-05 DIAGNOSIS — E8779 Other fluid overload: Secondary | ICD-10-CM | POA: Diagnosis not present

## 2012-08-05 DIAGNOSIS — N289 Disorder of kidney and ureter, unspecified: Secondary | ICD-10-CM | POA: Diagnosis not present

## 2012-08-05 DIAGNOSIS — I2510 Atherosclerotic heart disease of native coronary artery without angina pectoris: Principal | ICD-10-CM | POA: Diagnosis present

## 2012-08-05 DIAGNOSIS — K219 Gastro-esophageal reflux disease without esophagitis: Secondary | ICD-10-CM | POA: Diagnosis present

## 2012-08-05 DIAGNOSIS — D696 Thrombocytopenia, unspecified: Secondary | ICD-10-CM | POA: Diagnosis not present

## 2012-08-05 DIAGNOSIS — Z7982 Long term (current) use of aspirin: Secondary | ICD-10-CM

## 2012-08-05 DIAGNOSIS — I10 Essential (primary) hypertension: Secondary | ICD-10-CM | POA: Diagnosis present

## 2012-08-05 DIAGNOSIS — F17200 Nicotine dependence, unspecified, uncomplicated: Secondary | ICD-10-CM | POA: Diagnosis present

## 2012-08-05 DIAGNOSIS — I714 Abdominal aortic aneurysm, without rupture, unspecified: Secondary | ICD-10-CM | POA: Diagnosis present

## 2012-08-05 HISTORY — PX: CORONARY ARTERY BYPASS GRAFT: SHX141

## 2012-08-05 LAB — CREATININE, SERUM
Creatinine, Ser: 1.01 mg/dL (ref 0.50–1.35)
GFR calc Af Amer: 79 mL/min — ABNORMAL LOW (ref >90)
GFR calc non Af Amer: 68 mL/min — ABNORMAL LOW (ref >90)

## 2012-08-05 LAB — POCT I-STAT 4, (NA,K, GLUC, HGB,HCT)
Glucose, Bld: 88 mg/dL (ref 70–99)
Glucose, Bld: 102 mg/dL — ABNORMAL HIGH (ref 70–99)
Glucose, Bld: 88 mg/dL (ref 70–99)
Glucose, Bld: 122 mg/dL — ABNORMAL HIGH (ref 70–99)
Glucose, Bld: 135 mg/dL — ABNORMAL HIGH (ref 70–99)
Glucose, Bld: 122 mg/dL — ABNORMAL HIGH (ref 70–99)
HCT: 32 % — ABNORMAL LOW (ref 39.0–52.0)
HCT: 32 % — ABNORMAL LOW (ref 39.0–52.0)
HCT: 22 % — ABNORMAL LOW (ref 39.0–52.0)
HCT: 23 % — ABNORMAL LOW (ref 39.0–52.0)
HCT: 23 % — ABNORMAL LOW (ref 39.0–52.0)
HCT: 30 % — ABNORMAL LOW (ref 39.0–52.0)
Hemoglobin: 10.9 g/dL — ABNORMAL LOW (ref 13.0–17.0)
Hemoglobin: 10.9 g/dL — ABNORMAL LOW (ref 13.0–17.0)
Hemoglobin: 7.5 g/dL — ABNORMAL LOW (ref 13.0–17.0)
Hemoglobin: 7.8 g/dL — ABNORMAL LOW (ref 13.0–17.0)
Hemoglobin: 7.8 g/dL — ABNORMAL LOW (ref 13.0–17.0)
Hemoglobin: 10.2 g/dL — ABNORMAL LOW (ref 13.0–17.0)
Potassium: 3.7 mEq/L (ref 3.5–5.1)
Potassium: 3.6 mEq/L (ref 3.5–5.1)
Potassium: 3.5 mEq/L (ref 3.5–5.1)
Potassium: 4.3 mEq/L (ref 3.5–5.1)
Potassium: 3.7 mEq/L (ref 3.5–5.1)
Potassium: 3.7 mEq/L (ref 3.5–5.1)
Sodium: 141 mEq/L (ref 135–145)
Sodium: 141 mEq/L (ref 135–145)
Sodium: 135 mEq/L (ref 135–145)
Sodium: 137 mEq/L (ref 135–145)
Sodium: 140 mEq/L (ref 135–145)
Sodium: 141 mEq/L (ref 135–145)

## 2012-08-05 LAB — CBC
HCT: 29.2 % — ABNORMAL LOW (ref 39.0–52.0)
HCT: 28.2 % — ABNORMAL LOW (ref 39.0–52.0)
Hemoglobin: 9.9 g/dL — ABNORMAL LOW (ref 13.0–17.0)
Hemoglobin: 9.6 g/dL — ABNORMAL LOW (ref 13.0–17.0)
MCH: 30.4 pg (ref 26.0–34.0)
MCHC: 34 g/dL (ref 30.0–36.0)
MCV: 89.2 fL (ref 78.0–100.0)
Platelets: 148 10*3/uL — ABNORMAL LOW (ref 150–400)
RBC: 3.16 MIL/uL — ABNORMAL LOW (ref 4.22–5.81)
RDW: 13.5 % (ref 11.5–15.5)
RDW: 13.5 % (ref 11.5–15.5)
WBC: 18.2 10*3/uL — ABNORMAL HIGH (ref 4.0–10.5)
WBC: 15 10*3/uL — ABNORMAL HIGH (ref 4.0–10.5)

## 2012-08-05 LAB — POCT I-STAT 3, ART BLOOD GAS (G3+)
Acid-base deficit: 3 mmol/L — ABNORMAL HIGH (ref 0.0–2.0)
Acid-base deficit: 4 mmol/L — ABNORMAL HIGH (ref 0.0–2.0)
Acid-base deficit: 5 mmol/L — ABNORMAL HIGH (ref 0.0–2.0)
Acid-base deficit: 5 mmol/L — ABNORMAL HIGH (ref 0.0–2.0)
Bicarbonate: 22.7 mEq/L (ref 20.0–24.0)
Bicarbonate: 20.9 mEq/L (ref 20.0–24.0)
Bicarbonate: 19.9 mEq/L — ABNORMAL LOW (ref 20.0–24.0)
Bicarbonate: 20.2 mEq/L (ref 20.0–24.0)
O2 Saturation: 100 %
O2 Saturation: 90 %
O2 Saturation: 96 %
O2 Saturation: 88 %
Patient temperature: 36
Patient temperature: 36.8
Patient temperature: 36.8
TCO2: 24 mmol/L (ref 0–100)
TCO2: 22 mmol/L (ref 0–100)
TCO2: 21 mmol/L (ref 0–100)
TCO2: 21 mmol/L (ref 0–100)
pCO2 arterial: 43.5 mmHg (ref 35.0–45.0)
pCO2 arterial: 32.9 mmHg — ABNORMAL LOW (ref 35.0–45.0)
pCO2 arterial: 37 mmHg (ref 35.0–45.0)
pCO2 arterial: 34.4 mmHg — ABNORMAL LOW (ref 35.0–45.0)
pH, Arterial: 7.326 — ABNORMAL LOW (ref 7.350–7.450)
pH, Arterial: 7.407 (ref 7.350–7.450)
pH, Arterial: 7.338 — ABNORMAL LOW (ref 7.350–7.450)
pH, Arterial: 7.376 (ref 7.350–7.450)
pO2, Arterial: 355 mmHg — ABNORMAL HIGH (ref 80.0–100.0)
pO2, Arterial: 55 mmHg — ABNORMAL LOW (ref 80.0–100.0)
pO2, Arterial: 88 mmHg (ref 80.0–100.0)
pO2, Arterial: 55 mmHg — ABNORMAL LOW (ref 80.0–100.0)

## 2012-08-05 LAB — GLUCOSE, CAPILLARY
Glucose-Capillary: 109 mg/dL — ABNORMAL HIGH (ref 70–99)
Glucose-Capillary: 88 mg/dL (ref 70–99)
Glucose-Capillary: 111 mg/dL — ABNORMAL HIGH (ref 70–99)
Glucose-Capillary: 137 mg/dL — ABNORMAL HIGH (ref 70–99)
Glucose-Capillary: 164 mg/dL — ABNORMAL HIGH (ref 70–99)

## 2012-08-05 LAB — POCT I-STAT, CHEM 8
BUN: 15 mg/dL (ref 6–23)
Calcium, Ion: 1.13 mmol/L (ref 1.13–1.30)
Chloride: 108 mEq/L (ref 96–112)
Creatinine, Ser: 1.1 mg/dL (ref 0.50–1.35)
Glucose, Bld: 138 mg/dL — ABNORMAL HIGH (ref 70–99)
HCT: 26 % — ABNORMAL LOW (ref 39.0–52.0)
Hemoglobin: 8.8 g/dL — ABNORMAL LOW (ref 13.0–17.0)
Potassium: 4.6 mEq/L (ref 3.5–5.1)
Sodium: 141 mEq/L (ref 135–145)
TCO2: 18 mmol/L (ref 0–100)

## 2012-08-05 LAB — HEMOGLOBIN AND HEMATOCRIT, BLOOD
HCT: 22.7 % — ABNORMAL LOW (ref 39.0–52.0)
Hemoglobin: 7.8 g/dL — ABNORMAL LOW (ref 13.0–17.0)

## 2012-08-05 LAB — APTT: aPTT: 41 seconds — ABNORMAL HIGH (ref 24–37)

## 2012-08-05 LAB — MAGNESIUM: Magnesium: 2.5 mg/dL (ref 1.5–2.5)

## 2012-08-05 LAB — PROTIME-INR: INR: 1.61 — ABNORMAL HIGH (ref 0.00–1.49)

## 2012-08-05 LAB — PLATELET COUNT: Platelets: 147 10*3/uL — ABNORMAL LOW (ref 150–400)

## 2012-08-05 SURGERY — CORONARY ARTERY BYPASS GRAFTING (CABG)
Anesthesia: General | Site: Chest | Wound class: Clean

## 2012-08-05 MED ORDER — METOPROLOL TARTRATE 1 MG/ML IV SOLN: 2.5000 mg | INTRAVENOUS | Status: DC | PRN

## 2012-08-05 MED ORDER — DOCUSATE SODIUM 100 MG PO CAPS
200.0000 mg | ORAL_CAPSULE | Freq: Every day | ORAL | Status: DC
  Administered 2012-08-06: 200 mg via ORAL
  Filled 2012-08-05: qty 2

## 2012-08-05 MED ORDER — LACTATED RINGERS IV SOLN
INTRAVENOUS | Status: DC
  Administered 2012-08-05 – 2012-08-06 (×2): 20 mL/h via INTRAVENOUS

## 2012-08-05 MED ORDER — ADULT MULTIVITAMIN W/MINERALS CH
1.0000 | ORAL_TABLET | Freq: Every day | ORAL | Status: DC
  Administered 2012-08-06: 1 via ORAL
  Filled 2012-08-05 (×3): qty 1

## 2012-08-05 MED ORDER — CHLORHEXIDINE GLUCONATE 4 % EX LIQD: 30.0000 mL | CUTANEOUS | Status: DC

## 2012-08-05 MED ORDER — LIDOCAINE HCL (CARDIAC) 20 MG/ML IV SOLN
INTRAVENOUS | Status: DC | PRN
  Administered 2012-08-05: 100 mg via INTRAVENOUS

## 2012-08-05 MED ORDER — TRANEXAMIC ACID (OHS) BOLUS VIA INFUSION
15.0000 mg/kg | INTRAVENOUS | Status: AC
  Administered 2012-08-05: 1042.5 mg via INTRAVENOUS

## 2012-08-05 MED ORDER — NITROGLYCERIN IN D5W 200-5 MCG/ML-% IV SOLN
2.0000 ug/min | INTRAVENOUS | Status: AC
  Administered 2012-08-05: 5 ug/min via INTRAVENOUS

## 2012-08-05 MED ORDER — SODIUM CHLORIDE 0.9 % IJ SOLN
OROMUCOSAL | Status: DC | PRN
  Administered 2012-08-05: 09:00:00 via TOPICAL

## 2012-08-05 MED ORDER — VANCOMYCIN HCL IN DEXTROSE 1-5 GM/200ML-% IV SOLN
1000.0000 mg | Freq: Once | INTRAVENOUS | Status: AC
  Administered 2012-08-05: 1000 mg via INTRAVENOUS
  Filled 2012-08-05: qty 200

## 2012-08-05 MED ORDER — POTASSIUM CHLORIDE 2 MEQ/ML IV SOLN: 80.0000 meq | INTRAVENOUS | Status: DC

## 2012-08-05 MED ORDER — PHENYLEPHRINE HCL 10 MG/ML IJ SOLN
30.0000 ug/min | INTRAVENOUS | Status: AC
  Administered 2012-08-05: 5 ug/min via INTRAVENOUS

## 2012-08-05 MED ORDER — ACETAMINOPHEN 160 MG/5ML PO SOLN
975.0000 mg | Freq: Four times a day (QID) | ORAL | Status: DC
  Administered 2012-08-05: 975 mg
  Filled 2012-08-05: qty 40.6

## 2012-08-05 MED ORDER — HEPARIN SODIUM (PORCINE) 1000 UNIT/ML IJ SOLN
INTRAMUSCULAR | Status: AC
  Filled 2012-08-05: qty 2

## 2012-08-05 MED ORDER — EPINEPHRINE HCL 1 MG/ML IJ SOLN: 0.5000 ug/min | INTRAVENOUS | Status: DC

## 2012-08-05 MED ORDER — SODIUM CHLORIDE 0.9 % IJ SOLN
3.0000 mL | Freq: Two times a day (BID) | INTRAMUSCULAR | Status: DC
  Administered 2012-08-06 – 2012-08-07 (×2): 3 mL via INTRAVENOUS

## 2012-08-05 MED ORDER — INSULIN ASPART 100 UNIT/ML ~~LOC~~ SOLN
0.0000 [IU] | SUBCUTANEOUS | Status: DC
  Administered 2012-08-05 – 2012-08-06 (×3): 2 [IU] via SUBCUTANEOUS

## 2012-08-05 MED ORDER — ACETAMINOPHEN 160 MG/5ML PO SOLN: 650.0000 mg | ORAL | Status: AC

## 2012-08-05 MED ORDER — LACTATED RINGERS IV SOLN: 500.0000 mL | Freq: Once | INTRAVENOUS | Status: AC | PRN

## 2012-08-05 MED ORDER — DOPAMINE-DEXTROSE 3.2-5 MG/ML-% IV SOLN: 0.0000 ug/kg/min | INTRAVENOUS | Status: DC

## 2012-08-05 MED ORDER — ASPIRIN EC 325 MG PO TBEC
325.0000 mg | DELAYED_RELEASE_TABLET | Freq: Every day | ORAL | Status: DC
  Administered 2012-08-06: 325 mg via ORAL
  Filled 2012-08-05 (×2): qty 1

## 2012-08-05 MED ORDER — ALBUMIN HUMAN 5 % IV SOLN
INTRAVENOUS | Status: DC | PRN
  Administered 2012-08-05: 12:00:00 via INTRAVENOUS

## 2012-08-05 MED ORDER — BISACODYL 10 MG RE SUPP: 10.0000 mg | Freq: Every day | RECTAL | Status: DC

## 2012-08-05 MED ORDER — SODIUM CHLORIDE 0.9 % IV SOLN
INTRAVENOUS | Status: DC
  Filled 2012-08-05: qty 1

## 2012-08-05 MED ORDER — PHENYLEPHRINE HCL 10 MG/ML IJ SOLN
0.0000 ug/min | INTRAVENOUS | Status: DC
  Administered 2012-08-06: 25 ug/min via INTRAVENOUS
  Filled 2012-08-05 (×2): qty 2

## 2012-08-05 MED ORDER — MUPIROCIN 2 % EX OINT
TOPICAL_OINTMENT | CUTANEOUS | Status: AC
  Administered 2012-08-05: 1 via NASAL
  Filled 2012-08-05: qty 22

## 2012-08-05 MED ORDER — MORPHINE SULFATE 2 MG/ML IJ SOLN: 1.0000 mg | INTRAMUSCULAR | Status: AC | PRN

## 2012-08-05 MED ORDER — HEPARIN SODIUM (PORCINE) 1000 UNIT/ML IJ SOLN
INTRAMUSCULAR | Status: DC | PRN
  Administered 2012-08-05: 27000 [IU] via INTRAVENOUS

## 2012-08-05 MED ORDER — HEMOSTATIC AGENTS (NO CHARGE) OPTIME
TOPICAL | Status: DC | PRN
  Administered 2012-08-05: 1 via TOPICAL

## 2012-08-05 MED ORDER — VANCOMYCIN HCL 1000 MG IV SOLR: 1250.0000 mg | INTRAVENOUS | Status: DC

## 2012-08-05 MED ORDER — SODIUM CHLORIDE 0.9 % IV SOLN
INTRAVENOUS | Status: DC | PRN
  Administered 2012-08-05: 12:00:00 via INTRAVENOUS

## 2012-08-05 MED ORDER — MAGNESIUM SULFATE 40 MG/ML IJ SOLN
4.0000 g | Freq: Once | INTRAMUSCULAR | Status: AC
  Administered 2012-08-05: 4 g via INTRAVENOUS

## 2012-08-05 MED ORDER — HEPARIN SODIUM (PORCINE) 1000 UNIT/ML IJ SOLN
INTRAMUSCULAR | Status: AC
  Filled 2012-08-05: qty 1

## 2012-08-05 MED ORDER — METOPROLOL TARTRATE 25 MG/10 ML ORAL SUSPENSION
12.5000 mg | Freq: Two times a day (BID) | ORAL | Status: DC
  Filled 2012-08-05 (×5): qty 5

## 2012-08-05 MED ORDER — OXYCODONE HCL 5 MG PO TABS
5.0000 mg | ORAL_TABLET | ORAL | Status: DC | PRN
  Administered 2012-08-05: 5 mg via ORAL
  Administered 2012-08-06: 10 mg via ORAL
  Administered 2012-08-06: 5 mg via ORAL
  Administered 2012-08-06: 10 mg via ORAL
  Administered 2012-08-06: 5 mg via ORAL
  Administered 2012-08-06 – 2012-08-07 (×2): 10 mg via ORAL
  Filled 2012-08-05: qty 2
  Filled 2012-08-05: qty 1
  Filled 2012-08-05: qty 2
  Filled 2012-08-05: qty 1
  Filled 2012-08-05 (×2): qty 2
  Filled 2012-08-05: qty 1

## 2012-08-05 MED ORDER — TRANEXAMIC ACID 100 MG/ML IV SOLN
1.5000 mg/kg/h | INTRAVENOUS | Status: AC
  Administered 2012-08-05: 1.5 mg/kg/h via INTRAVENOUS

## 2012-08-05 MED ORDER — SODIUM CHLORIDE 0.9 % IV SOLN
INTRAVENOUS | Status: DC
  Administered 2012-08-05: 20 mL/h via INTRAVENOUS

## 2012-08-05 MED ORDER — DEXTROSE 5 % IV SOLN
1.5000 g | Freq: Two times a day (BID) | INTRAVENOUS | Status: DC
  Administered 2012-08-05 – 2012-08-06 (×3): 1.5 g via INTRAVENOUS
  Filled 2012-08-05 (×4): qty 1.5

## 2012-08-05 MED ORDER — MUPIROCIN 2 % EX OINT
TOPICAL_OINTMENT | Freq: Two times a day (BID) | CUTANEOUS | Status: DC
Start: 2012-08-05 — End: 2012-08-10
  Administered 2012-08-05 – 2012-08-09 (×7): via NASAL
  Filled 2012-08-05 (×2): qty 22

## 2012-08-05 MED ORDER — NITROGLYCERIN IN D5W 200-5 MCG/ML-% IV SOLN: 0.0000 ug/min | INTRAVENOUS | Status: DC

## 2012-08-05 MED ORDER — PROTAMINE SULFATE 10 MG/ML IV SOLN
INTRAVENOUS | Status: DC | PRN
  Administered 2012-08-05: 270 mg via INTRAVENOUS

## 2012-08-05 MED ORDER — SODIUM CHLORIDE 0.9 % IJ SOLN: 3.0000 mL | INTRAMUSCULAR | Status: DC | PRN

## 2012-08-05 MED ORDER — SODIUM CHLORIDE 0.9 % IV SOLN: 250.0000 mL | INTRAVENOUS | Status: DC

## 2012-08-05 MED ORDER — METOPROLOL TARTRATE 12.5 MG HALF TABLET
12.5000 mg | ORAL_TABLET | Freq: Once | ORAL | Status: AC
  Administered 2012-08-05: 12.5 mg via ORAL
  Filled 2012-08-05 (×2): qty 1

## 2012-08-05 MED ORDER — METOPROLOL TARTRATE 12.5 MG HALF TABLET
12.5000 mg | ORAL_TABLET | Freq: Two times a day (BID) | ORAL | Status: DC
  Administered 2012-08-06: 12.5 mg via ORAL
  Filled 2012-08-05 (×5): qty 1

## 2012-08-05 MED ORDER — MORPHINE SULFATE 2 MG/ML IJ SOLN
2.0000 mg | INTRAMUSCULAR | Status: DC | PRN
  Administered 2012-08-05 (×2): 2 mg via INTRAVENOUS
  Administered 2012-08-06: 4 mg via INTRAVENOUS
  Filled 2012-08-05 (×5): qty 1

## 2012-08-05 MED ORDER — DEXMEDETOMIDINE HCL IN NACL 200 MCG/50ML IV SOLN
0.1000 ug/kg/h | INTRAVENOUS | Status: DC
  Administered 2012-08-05: 0.5 ug/kg/h via INTRAVENOUS
  Administered 2012-08-05: 0.1 ug/kg/h via INTRAVENOUS
  Filled 2012-08-05: qty 50

## 2012-08-05 MED ORDER — FAMOTIDINE IN NACL 20-0.9 MG/50ML-% IV SOLN
20.0000 mg | Freq: Two times a day (BID) | INTRAVENOUS | Status: DC
  Administered 2012-08-05: 20 mg via INTRAVENOUS

## 2012-08-05 MED ORDER — DEXMEDETOMIDINE HCL IN NACL 400 MCG/100ML IV SOLN
0.1000 ug/kg/h | INTRAVENOUS | Status: AC
  Administered 2012-08-05: 0.2 ug/kg/h via INTRAVENOUS

## 2012-08-05 MED ORDER — ACETAMINOPHEN 650 MG RE SUPP
650.0000 mg | RECTAL | Status: AC
  Administered 2012-08-05: 650 mg via RECTAL

## 2012-08-05 MED ORDER — BACITRACIN-NEOMYCIN-POLYMYXIN 400-5-5000 EX OINT
TOPICAL_OINTMENT | CUTANEOUS | Status: AC
  Filled 2012-08-05: qty 1

## 2012-08-05 MED ORDER — ASPIRIN 81 MG PO CHEW: 324.0000 mg | CHEWABLE_TABLET | Freq: Every day | ORAL | Status: DC

## 2012-08-05 MED ORDER — FENTANYL CITRATE 0.05 MG/ML IJ SOLN
INTRAMUSCULAR | Status: DC | PRN
  Administered 2012-08-05: 100 ug via INTRAVENOUS
  Administered 2012-08-05: 50 ug via INTRAVENOUS
  Administered 2012-08-05: 450 ug via INTRAVENOUS
  Administered 2012-08-05: 250 ug via INTRAVENOUS
  Administered 2012-08-05: 150 ug via INTRAVENOUS
  Administered 2012-08-05: 100 ug via INTRAVENOUS
  Administered 2012-08-05: 150 ug via INTRAVENOUS

## 2012-08-05 MED ORDER — PROPOFOL 10 MG/ML IV BOLUS
INTRAVENOUS | Status: DC | PRN
  Administered 2012-08-05: 60 mg via INTRAVENOUS

## 2012-08-05 MED ORDER — POTASSIUM CHLORIDE 10 MEQ/50ML IV SOLN
10.0000 meq | INTRAVENOUS | Status: AC
  Administered 2012-08-05 (×3): 10 meq via INTRAVENOUS

## 2012-08-05 MED ORDER — MAGNESIUM SULFATE 40 MG/ML IJ SOLN
INTRAMUSCULAR | Status: AC
  Filled 2012-08-05: qty 100

## 2012-08-05 MED ORDER — INSULIN ASPART 100 UNIT/ML ~~LOC~~ SOLN
0.0000 [IU] | SUBCUTANEOUS | Status: AC
  Administered 2012-08-05 (×2): 2 [IU] via SUBCUTANEOUS
  Administered 2012-08-05: 4 [IU] via SUBCUTANEOUS

## 2012-08-05 MED ORDER — PANTOPRAZOLE SODIUM 40 MG PO TBEC
40.0000 mg | DELAYED_RELEASE_TABLET | Freq: Every day | ORAL | Status: DC
  Filled 2012-08-05: qty 1

## 2012-08-05 MED ORDER — SODIUM BICARBONATE 8.4 % IV SOLN
INTRAVENOUS | Status: DC
  Filled 2012-08-05 (×2): qty 2.5

## 2012-08-05 MED ORDER — TRANEXAMIC ACID (OHS) PUMP PRIME SOLUTION: 2.0000 mg/kg | INTRAVENOUS | Status: DC

## 2012-08-05 MED ORDER — ACETAMINOPHEN 500 MG PO TABS
1000.0000 mg | ORAL_TABLET | Freq: Four times a day (QID) | ORAL | Status: DC
  Administered 2012-08-05 – 2012-08-07 (×5): 1000 mg via ORAL
  Filled 2012-08-05 (×10): qty 2

## 2012-08-05 MED ORDER — DOPAMINE-DEXTROSE 3.2-5 MG/ML-% IV SOLN
2.0000 ug/kg/min | INTRAVENOUS | Status: AC
  Administered 2012-08-05: 3 ug/kg/min via INTRAVENOUS

## 2012-08-05 MED ORDER — ALBUMIN HUMAN 5 % IV SOLN
250.0000 mL | INTRAVENOUS | Status: AC | PRN
  Administered 2012-08-05 – 2012-08-06 (×4): 250 mL via INTRAVENOUS
  Filled 2012-08-05: qty 500

## 2012-08-05 MED ORDER — BISACODYL 5 MG PO TBEC
10.0000 mg | DELAYED_RELEASE_TABLET | Freq: Every day | ORAL | Status: DC
  Administered 2012-08-06: 10 mg via ORAL
  Filled 2012-08-05: qty 2

## 2012-08-05 MED ORDER — INSULIN REGULAR BOLUS VIA INFUSION
0.0000 [IU] | Freq: Three times a day (TID) | INTRAVENOUS | Status: DC
  Filled 2012-08-05: qty 10

## 2012-08-05 MED ORDER — MIDAZOLAM HCL 2 MG/2ML IJ SOLN: 2.0000 mg | INTRAMUSCULAR | Status: DC | PRN

## 2012-08-05 MED ORDER — ROCURONIUM BROMIDE 100 MG/10ML IV SOLN
INTRAVENOUS | Status: DC | PRN
  Administered 2012-08-05: 60 mg via INTRAVENOUS
  Administered 2012-08-05: 40 mg via INTRAVENOUS
  Administered 2012-08-05: 50 mg via INTRAVENOUS

## 2012-08-05 MED ORDER — SODIUM CHLORIDE 0.9 % IV SOLN
INTRAVENOUS | Status: AC
  Administered 2012-08-05: 1 [IU]/h via INTRAVENOUS

## 2012-08-05 MED ORDER — MAGNESIUM SULFATE 50 % IJ SOLN: 40.0000 meq | INTRAMUSCULAR | Status: DC

## 2012-08-05 MED ORDER — SODIUM CHLORIDE 0.45 % IV SOLN
INTRAVENOUS | Status: DC
  Administered 2012-08-05: 20 mL/h via INTRAVENOUS

## 2012-08-05 MED ORDER — ONDANSETRON HCL 4 MG/2ML IJ SOLN
4.0000 mg | Freq: Four times a day (QID) | INTRAMUSCULAR | Status: DC | PRN
  Administered 2012-08-06: 4 mg via INTRAVENOUS
  Filled 2012-08-05: qty 2

## 2012-08-05 MED ORDER — LACTATED RINGERS IV SOLN
INTRAVENOUS | Status: DC | PRN
  Administered 2012-08-05 (×2): via INTRAVENOUS

## 2012-08-05 MED ORDER — DEXTROSE 5 % IV SOLN: 1.5000 g | INTRAVENOUS | Status: DC

## 2012-08-05 MED ORDER — MIDAZOLAM HCL 5 MG/5ML IJ SOLN
INTRAMUSCULAR | Status: DC | PRN
  Administered 2012-08-05 (×3): 2 mg via INTRAVENOUS
  Administered 2012-08-05: 1 mg via INTRAVENOUS
  Administered 2012-08-05: 3 mg via INTRAVENOUS

## 2012-08-05 SURGICAL SUPPLY — 116 items
ATTRACTOMAT 16X20 MAGNETIC DRP (DRAPES) ×2 IMPLANT
BAG DECANTER FOR FLEXI CONT (MISCELLANEOUS) ×2 IMPLANT
BANDAGE ELASTIC 4 VELCRO ST LF (GAUZE/BANDAGES/DRESSINGS) ×2 IMPLANT
BANDAGE ELASTIC 6 VELCRO ST LF (GAUZE/BANDAGES/DRESSINGS) ×2 IMPLANT
BANDAGE GAUZE ELAST BULKY 4 IN (GAUZE/BANDAGES/DRESSINGS) ×2 IMPLANT
BLADE STERNUM SYSTEM 6 (BLADE) ×2 IMPLANT
BLADE SURG 11 STRL SS (BLADE) ×2 IMPLANT
BLADE SURG ROTATE 9660 (MISCELLANEOUS) ×2 IMPLANT
CANISTER SUCTION 2500CC (MISCELLANEOUS) ×2 IMPLANT
CANN PRFSN .5XCNCT 15X34-48 (MISCELLANEOUS) ×1
CANNULA AORTIC HI-FLOW 6.5M20F (CANNULA) ×2 IMPLANT
CANNULA PRFSN .5XCNCT 15X34-48 (MISCELLANEOUS) ×1 IMPLANT
CANNULA VEN 2 STAGE (MISCELLANEOUS) ×1
CATH CPB KIT GERHARDT (MISCELLANEOUS) ×2 IMPLANT
CATH THORACIC 28FR (CATHETERS) ×2 IMPLANT
CATH THORACIC 36FR (CATHETERS) IMPLANT
CATH THORACIC 36FR RT ANG (CATHETERS) IMPLANT
CLIP TI MEDIUM 24 (CLIP) IMPLANT
CLIP TI WIDE RED SMALL 24 (CLIP) IMPLANT
CLOTH BEACON ORANGE TIMEOUT ST (SAFETY) ×2 IMPLANT
COVER SURGICAL LIGHT HANDLE (MISCELLANEOUS) ×4 IMPLANT
CRADLE DONUT ADULT HEAD (MISCELLANEOUS) ×2 IMPLANT
DERMABOND ADVANCED (GAUZE/BANDAGES/DRESSINGS) ×2
DERMABOND ADVANCED .7 DNX12 (GAUZE/BANDAGES/DRESSINGS) ×2 IMPLANT
DRAIN CHANNEL 28F RND 3/8 FF (WOUND CARE) ×2 IMPLANT
DRAIN CHANNEL 32F RND 10.7 FF (WOUND CARE) IMPLANT
DRAPE CARDIOVASCULAR INCISE (DRAPES) ×1
DRAPE SLUSH MACHINE 52X66 (DRAPES) IMPLANT
DRAPE SLUSH/WARMER DISC (DRAPES) IMPLANT
DRAPE SRG 135X102X78XABS (DRAPES) ×1 IMPLANT
DRSG COVADERM 4X14 (GAUZE/BANDAGES/DRESSINGS) ×2 IMPLANT
ELECT BLADE 4.0 EZ CLEAN MEGAD (MISCELLANEOUS) ×4
ELECT CAUTERY BLADE 6.4 (BLADE) ×2 IMPLANT
ELECT REM PT RETURN 9FT ADLT (ELECTROSURGICAL) ×4
ELECTRODE BLDE 4.0 EZ CLN MEGD (MISCELLANEOUS) ×2 IMPLANT
ELECTRODE REM PT RTRN 9FT ADLT (ELECTROSURGICAL) ×2 IMPLANT
GLOVE BIO SURGEON STRL SZ 6 (GLOVE) IMPLANT
GLOVE BIO SURGEON STRL SZ 6.5 (GLOVE) ×12 IMPLANT
GLOVE BIO SURGEON STRL SZ7 (GLOVE) IMPLANT
GLOVE BIO SURGEON STRL SZ7.5 (GLOVE) IMPLANT
GLOVE BIOGEL PI IND STRL 6 (GLOVE) ×4 IMPLANT
GLOVE BIOGEL PI IND STRL 6.5 (GLOVE) IMPLANT
GLOVE BIOGEL PI IND STRL 7.0 (GLOVE) ×3 IMPLANT
GLOVE BIOGEL PI INDICATOR 6 (GLOVE) ×4
GLOVE BIOGEL PI INDICATOR 6.5 (GLOVE)
GLOVE BIOGEL PI INDICATOR 7.0 (GLOVE) ×3
GLOVE EUDERMIC 7 POWDERFREE (GLOVE) IMPLANT
GLOVE ORTHO TXT STRL SZ7.5 (GLOVE) IMPLANT
GOWN PREVENTION PLUS XLARGE (GOWN DISPOSABLE) ×6 IMPLANT
GOWN STRL NON-REIN LRG LVL3 (GOWN DISPOSABLE) ×16 IMPLANT
HEMOSTAT POWDER SURGIFOAM 1G (HEMOSTASIS) ×6 IMPLANT
HEMOSTAT SURGICEL 2X14 (HEMOSTASIS) ×2 IMPLANT
INSERT FOGARTY 61MM (MISCELLANEOUS) IMPLANT
INSERT FOGARTY XLG (MISCELLANEOUS) IMPLANT
KIT BASIN OR (CUSTOM PROCEDURE TRAY) ×2 IMPLANT
KIT ROOM TURNOVER OR (KITS) ×2 IMPLANT
KIT SUCTION CATH 14FR (SUCTIONS) ×4 IMPLANT
KIT VASOVIEW W/TROCAR VH 2000 (KITS) ×2 IMPLANT
LEAD PACING MYOCARDI (MISCELLANEOUS) ×2 IMPLANT
MARKER GRAFT CORONARY BYPASS (MISCELLANEOUS) ×6 IMPLANT
NS IRRIG 1000ML POUR BTL (IV SOLUTION) ×12 IMPLANT
PACK OPEN HEART (CUSTOM PROCEDURE TRAY) ×2 IMPLANT
PAD ARMBOARD 7.5X6 YLW CONV (MISCELLANEOUS) ×4 IMPLANT
PENCIL BUTTON HOLSTER BLD 10FT (ELECTRODE) ×2 IMPLANT
PUNCH AORTIC ROTATE 4.0MM (MISCELLANEOUS) ×2 IMPLANT
PUNCH AORTIC ROTATE 4.5MM 8IN (MISCELLANEOUS) IMPLANT
PUNCH AORTIC ROTATE 5MM 8IN (MISCELLANEOUS) IMPLANT
SET CARDIOPLEGIA MPS 5001102 (MISCELLANEOUS) ×2 IMPLANT
SOLUTION ANTI FOG 6CC (MISCELLANEOUS) IMPLANT
SPONGE GAUZE 4X4 12PLY (GAUZE/BANDAGES/DRESSINGS) ×8 IMPLANT
SPONGE LAP 18X18 X RAY DECT (DISPOSABLE) ×6 IMPLANT
SPONGE LAP 4X18 X RAY DECT (DISPOSABLE) IMPLANT
SUT BONE WAX W31G (SUTURE) ×2 IMPLANT
SUT ETHILON 2 0 PSLX (SUTURE) ×2 IMPLANT
SUT MNCRL AB 4-0 PS2 18 (SUTURE) ×4 IMPLANT
SUT PROLENE 3 0 SH DA (SUTURE) ×2 IMPLANT
SUT PROLENE 3 0 SH1 36 (SUTURE) ×2 IMPLANT
SUT PROLENE 4 0 RB 1 (SUTURE)
SUT PROLENE 4 0 SH DA (SUTURE) IMPLANT
SUT PROLENE 4 0 TF (SUTURE) ×4 IMPLANT
SUT PROLENE 4-0 RB1 .5 CRCL 36 (SUTURE) IMPLANT
SUT PROLENE 5 0 C 1 36 (SUTURE) IMPLANT
SUT PROLENE 6 0 C 1 30 (SUTURE) ×4 IMPLANT
SUT PROLENE 6 0 CC (SUTURE) ×12 IMPLANT
SUT PROLENE 7 0 BV 1 (SUTURE) IMPLANT
SUT PROLENE 7 0 BV1 MDA (SUTURE) ×4 IMPLANT
SUT PROLENE 7.0 RB 3 (SUTURE) IMPLANT
SUT PROLENE 8 0 BV175 6 (SUTURE) ×12 IMPLANT
SUT SILK  1 MH (SUTURE)
SUT SILK 1 MH (SUTURE) IMPLANT
SUT SILK 2 0 SH CR/8 (SUTURE) IMPLANT
SUT SILK 3 0 SH CR/8 (SUTURE) IMPLANT
SUT STEEL 6MS V (SUTURE) ×2 IMPLANT
SUT STEEL STERNAL CCS#1 18IN (SUTURE) IMPLANT
SUT STEEL SZ 6 DBL 3X14 BALL (SUTURE) ×2 IMPLANT
SUT VIC AB 1 CTX 18 (SUTURE) ×8 IMPLANT
SUT VIC AB 1 CTX 36 (SUTURE)
SUT VIC AB 1 CTX36XBRD ANBCTR (SUTURE) IMPLANT
SUT VIC AB 2-0 CT1 27 (SUTURE) ×3
SUT VIC AB 2-0 CT1 TAPERPNT 27 (SUTURE) ×3 IMPLANT
SUT VIC AB 2-0 CTX 27 (SUTURE) IMPLANT
SUT VIC AB 3-0 SH 27 (SUTURE)
SUT VIC AB 3-0 SH 27X BRD (SUTURE) IMPLANT
SUT VIC AB 3-0 X1 27 (SUTURE) IMPLANT
SUT VICRYL 4-0 PS2 18IN ABS (SUTURE) IMPLANT
SUTURE E-PAK OPEN HEART (SUTURE) ×2 IMPLANT
SYSTEM SAHARA CHEST DRAIN ATS (WOUND CARE) ×2 IMPLANT
TAPE CLOTH SURG 4X10 WHT LF (GAUZE/BANDAGES/DRESSINGS) ×4 IMPLANT
TOWEL OR 17X24 6PK STRL BLUE (TOWEL DISPOSABLE) ×4 IMPLANT
TOWEL OR 17X26 10 PK STRL BLUE (TOWEL DISPOSABLE) ×4 IMPLANT
TRAY FOLEY IC TEMP SENS 14FR (CATHETERS) ×2 IMPLANT
TUBE FEEDING 8FR 16IN STR KANG (MISCELLANEOUS) ×2 IMPLANT
TUBE SUCT INTRACARD DLP 20F (MISCELLANEOUS) ×2 IMPLANT
TUBING INSUFFLATION 10FT LAP (TUBING) ×2 IMPLANT
UNDERPAD 30X30 INCONTINENT (UNDERPADS AND DIAPERS) ×2 IMPLANT
WATER STERILE IRR 1000ML POUR (IV SOLUTION) ×4 IMPLANT

## 2012-08-05 NOTE — Brief Op Note (Addendum)
08/05/2012   PATIENT:  Marc [name]  78 y.o. male  PRE-OPERATIVE DIAGNOSIS:  CAD  POST-OPERATIVE DIAGNOSIS:  CAD  PROCEDURE: CORONARY ARTERY BYPASS GRAFTING (CABG) x 6 (LIMA to LAD, SVG to Diagonal, SVG sequentially to OM1 and OM2, SVG sequentially to distal Circumflex and RCA) with EVH from the left thigh and lower leg.  SURGEON:  Surgeon(s) and Role:    * Grace Isaac, MD - Primary  PHYSICIAN ASSISTANT: Lars Pinks PA-C   ANESTHESIA:   general  EBL:  Total I/O In: 2200 [I.V.:2200] Out: 69 [Urine:410]   DRAINS:  Chest Tube(s) in the Mediastinal and  Left Pleural Spaces , foley, s/g rt IJ, a line left wrist   COUNTS CORRECT:  YES  DICTATION: .Other Dictation: Dictation Number   PLAN OF CARE: Admit to inpatient   PATIENT DISPOSITION:  ICU - intubated and hemodynamically stable.   Delay start of Pharmacological VTE agent (>24hrs) due to surgical blood loss or risk of bleeding: yes   PRE OP WEIGHT: 69.5 kg   Grace Isaac MD  Beeper [phone] Office [phone] 08/05/2012 1:02 PM

## 2012-08-05 NOTE — Anesthesia Postprocedure Evaluation (Signed)
  Anesthesia Post-op Note  Patient: Marc [name]  Procedure(s) Performed: Procedure(s) (LRB) with comments: CORONARY ARTERY BYPASS GRAFTING (CABG) (N/A)  Patient Location: SICU  Anesthesia Type: General  Level of Consciousness: Patient remains intubated per anesthesia plan  Airway and Oxygen Therapy: Patient remains intubated per anesthesia plan and Patient placed on Ventilator (see vital sign flow sheet for setting)  Post-op Pain: none  Post-op Assessment: Post-op Vital signs reviewed, Patient's Cardiovascular Status Stable, Respiratory Function Stable, Patent Airway, No signs of Nausea or vomiting and Pain level controlled  Post-op Vital Signs: stable  Complications: No apparent anesthesia complications

## 2012-08-05 NOTE — Plan of Care (Signed)
Problem: Phase II Progression Outcomes Goal: Patient extubated within - Outcome: Completed/Met Date Met:  08/05/12 6hours

## 2012-08-05 NOTE — OR Nursing (Signed)
Nitroglycerin-nicardipine-heparin-sodium bicarbonate irrigation for artery spasm 575ml was given by Dr. Servando Snare as irrigation for operative site. Medication expires 08/05/12 at 1400.
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
## 2012-08-05 NOTE — Progress Notes (Signed)
Patient ID: Marc [name], male   DOB: 05-Dec-1929, 78 y.o.   MRN: [id number]                   [address]            [place] [address]          [phone]     Day of Surgery Procedure(s) (LRB): CORONARY ARTERY BYPASS GRAFTING (CABG) (N/A)  Total Length of Stay:  LOS: 0 days  BP 92/54  Pulse 90  Temp 96.8 F (36 C) (Core (Comment))  Resp 12  Wt 153 lb (69.4 kg)  SpO2 100%     . sodium chloride 20 mL/hr (08/05/12 1435)  . sodium chloride 20 mL/hr (08/05/12 1434)  . sodium chloride    . dexmedetomidine Stopped (08/05/12 1700)  . DOPamine 3 mcg/kg/min (08/05/12 1700)  . lactated ringers 20 mL/hr (08/05/12 1435)  . nitroGLYCERIN Stopped (08/05/12 1605)  . phenylephrine (NEO-SYNEPHRINE) Adult infusion 10 mcg/min (08/05/12 1600)  . DISCONTD: insulin (NOVOLIN-R) infusion 0.3 Units/hr (08/05/12 1604)     Lab Results  Component Value Date   WBC 18.2* 08/05/2012   HGB 9.9* 08/05/2012   HCT 29.2* 08/05/2012   PLT 148* 08/05/2012   GLUCOSE 122* 08/05/2012   CHOL  Value: 184 (NOTE) ATP III Classification:      < 200        mg/dL        Desirable     200 - 239     mg/dL        Borderline High     >= 240        mg/dL        High  02/23/2011   TRIG 259* 02/23/2011   HDL 34* 02/23/2011   LDLCALC  Value: 98 (NOTE)  Total Cholesterol/HDL Ratio:CHD Risk                       Coronary Heart Disease Risk Table                                       Men       Women         1/2 Average Risk              3.4        3.3             Average Risk              5.0         4.4          2X Average Risk              9.6        7.1          3X Average Risk             23.4       11.0 Use the calculated Patient Ratio above and the CHD Risk table  to determine the patient's CHD Risk. ATP III Classification (LDL):      < 100         mg/dL         Optimal     100 - 129     mg/dL         Near or Above Optimal  130 - 159     mg/dL         Borderline High     160 - 189     mg/dL         High      >  190        mg/dL         Very High  02/23/2011   ALT 13 08/04/2012   AST 46* 08/04/2012   NA 141 08/05/2012   K 3.7 08/05/2012   CL 103 08/04/2012   CREATININE 1.00 08/04/2012   BUN 21 08/04/2012   CO2 19 08/04/2012   INR 1.61* 08/05/2012   HGBA1C 5.7* 08/04/2012   Still asleep, but does open eyes Not bleeding ci over 2.0   Grace Isaac MD  Beeper [phone] Office [phone] 08/05/2012 5:16 PM

## 2012-08-05 NOTE — Anesthesia Preprocedure Evaluation (Signed)
Anesthesia Evaluation  Patient identified by MRN, date of birth, ID band Patient awake    Reviewed: Allergy & Precautions, H&P , NPO status , Patient's Chart, lab work & pertinent test results  Airway Mallampati: I TM Distance: >3 FB Neck ROM: Full    Dental   Pulmonary COPDCurrent Smoker,  + rhonchi         Cardiovascular hypertension, + CAD Rhythm:Regular Rate:Normal     Neuro/Psych CVA    GI/Hepatic GERD-  ,  Endo/Other    Renal/GU      Musculoskeletal   Abdominal   Peds  Hematology   Anesthesia Other Findings   Reproductive/Obstetrics                           Anesthesia Physical Anesthesia Plan  ASA: IV  Anesthesia Plan: General   Post-op Pain Management:    Induction: Intravenous  Airway Management Planned: Oral ETT  Additional Equipment: Arterial line and PA Cath  Intra-op Plan:   Post-operative Plan: Post-operative intubation/ventilation  Informed Consent: I have reviewed the patients History and Physical, chart, labs and discussed the procedure including the risks, benefits and alternatives for the proposed anesthesia with the patient or authorized representative who has indicated his/her understanding and acceptance.     Plan Discussed with: CRNA and Surgeon  Anesthesia Plan Comments:         Anesthesia Quick Evaluation

## 2012-08-05 NOTE — Preoperative (Signed)
Beta Blockers   Reason not to administer Beta Blockers:Not Applicable 

## 2012-08-05 NOTE — Progress Notes (Signed)
Spoke with Pilar Plate in resp. Therapy to request pft results. He stated pulmonary team would be here about 7 am and they would be the ones to provide the results.

## 2012-08-05 NOTE — Transfer of Care (Signed)
Immediate Anesthesia Transfer of Care Note  Patient: Marc [name]  Procedure(s) Performed: Procedure(s) (LRB) with comments: CORONARY ARTERY BYPASS GRAFTING (CABG) (N/A)  Patient Location: SICU  Anesthesia Type: General  Level of Consciousness: sedated  Airway & Oxygen Therapy: Patient remains intubated per anesthesia plan  Post-op Assessment: Report given to PACU RN and Post -op Vital signs reviewed and stable  Post vital signs: Reviewed and stable  Complications: No apparent anesthesia complications

## 2012-08-05 NOTE — Procedures (Signed)
Extubation Procedure Note  Patient Details:   Name: Marc [name] DOB: [birth year] MRN: [id number]   Airway Documentation:  Pt weaned from vent. NIF 40cm and VC 1185cc's. Pt now wearing nasal cannula with oxygen running at 4lpm.   Evaluation  O2 sats: stable throughout Complications: No apparent complications Patient did tolerate procedure well. Bilateral Breath Sounds: Clear   Yes  Malachi Paradise 08/05/2012, 7:14 PM

## 2012-08-06 ENCOUNTER — Inpatient Hospital Stay (HOSPITAL_COMMUNITY): Payer: Medicare Other

## 2012-08-06 LAB — MAGNESIUM: Magnesium: 2.1 mg/dL (ref 1.5–2.5)

## 2012-08-06 LAB — GLUCOSE, CAPILLARY
Glucose-Capillary: 100 mg/dL — ABNORMAL HIGH (ref 70–99)
Glucose-Capillary: 111 mg/dL — ABNORMAL HIGH (ref 70–99)
Glucose-Capillary: 123 mg/dL — ABNORMAL HIGH (ref 70–99)
Glucose-Capillary: 128 mg/dL — ABNORMAL HIGH (ref 70–99)
Glucose-Capillary: 143 mg/dL — ABNORMAL HIGH (ref 70–99)
Glucose-Capillary: 97 mg/dL (ref 70–99)

## 2012-08-06 LAB — CBC
HCT: 24.1 % — ABNORMAL LOW (ref 39.0–52.0)
MCHC: 34 g/dL (ref 30.0–36.0)
MCV: 88.3 fL (ref 78.0–100.0)
MCV: 89.9 fL (ref 78.0–100.0)
Platelets: 135 10*3/uL — ABNORMAL LOW (ref 150–400)
Platelets: 126 10*3/uL — ABNORMAL LOW (ref 150–400)
RBC: 2.77 MIL/uL — ABNORMAL LOW (ref 4.22–5.81)
RDW: 13.6 % (ref 11.5–15.5)
WBC: 11.4 10*3/uL — ABNORMAL HIGH (ref 4.0–10.5)

## 2012-08-06 LAB — BASIC METABOLIC PANEL
BUN: 15 mg/dL (ref 6–23)
Creatinine, Ser: 1.01 mg/dL (ref 0.50–1.35)
GFR calc Af Amer: 79 mL/min — ABNORMAL LOW (ref >90)
GFR calc non Af Amer: 68 mL/min — ABNORMAL LOW (ref >90)

## 2012-08-06 LAB — POCT I-STAT, CHEM 8
BUN: 14 mg/dL (ref 6–23)
Calcium, Ion: 1.12 mmol/L — ABNORMAL LOW (ref 1.13–1.30)
Chloride: 102 mEq/L (ref 96–112)
Creatinine, Ser: 1.3 mg/dL (ref 0.50–1.35)
Glucose, Bld: 113 mg/dL — ABNORMAL HIGH (ref 70–99)
HCT: 25 % — ABNORMAL LOW (ref 39.0–52.0)
Hemoglobin: 8.5 g/dL — ABNORMAL LOW (ref 13.0–17.0)
Potassium: 4 mEq/L (ref 3.5–5.1)
Sodium: 138 mEq/L (ref 135–145)
TCO2: 21 mmol/L (ref 0–100)

## 2012-08-06 LAB — CREATININE, SERUM
Creatinine, Ser: 1.21 mg/dL (ref 0.50–1.35)
GFR calc Af Amer: 63 mL/min — ABNORMAL LOW (ref >90)

## 2012-08-06 MED ORDER — FUROSEMIDE 10 MG/ML IJ SOLN
20.0000 mg | Freq: Two times a day (BID) | INTRAMUSCULAR | Status: AC
  Administered 2012-08-06 (×2): 20 mg via INTRAVENOUS

## 2012-08-06 MED ORDER — POTASSIUM CHLORIDE CRYS ER 20 MEQ PO TBCR
20.0000 meq | EXTENDED_RELEASE_TABLET | Freq: Once | ORAL | Status: AC
  Administered 2012-08-06: 20 meq via ORAL
  Filled 2012-08-06: qty 1

## 2012-08-06 MED ORDER — INSULIN ASPART 100 UNIT/ML ~~LOC~~ SOLN
0.0000 [IU] | SUBCUTANEOUS | Status: DC
  Administered 2012-08-06 (×2): 2 [IU] via SUBCUTANEOUS

## 2012-08-06 NOTE — Progress Notes (Addendum)
1 Day Post-Op Procedure(s) (LRB): CORONARY ARTERY BYPASS GRAFTING (CABG) (N/A)    Subjective: Patient is awake and alert this am. No real complaints   Objective: Vital signs in last 24 hours: Patient Vitals for the past 24 hrs:  BP Temp Temp src Pulse Resp SpO2 Height Weight  08/06/12 0700 120/59 mmHg 98.8 F (37.1 C) - 90  14  100 % - -  08/06/12 0645 - 98.8 F (37.1 C) - 90  15  100 % - -  08/06/12 0630 111/53 mmHg 98.8 F (37.1 C) - 90  13  100 % - -  08/06/12 0615 - 98.8 F (37.1 C) - 90  16  99 % - -  08/06/12 0600 112/64 mmHg - - 90  15  100 % - 165 lb 2 oz (74.9 kg)  08/06/12 0545 - 98.8 F (37.1 C) - 89  17  100 % - -  08/06/12 0530 107/49 mmHg 98.8 F (37.1 C) - 90  14  100 % - -  08/06/12 0515 - 98.8 F (37.1 C) - 90  16  100 % - -  08/06/12 0500 104/55 mmHg 98.6 F (37 C) - 90  15  100 % - -  08/06/12 0445 - 98.6 F (37 C) - 90  15  100 % - -  08/06/12 0430 113/58 mmHg 98.4 F (36.9 C) - 90  19  100 % - -  08/06/12 0415 - 98.4 F (36.9 C) - 34  15  69 % - -  08/06/12 0400 94/56 mmHg 98.4 F (36.9 C) - 54  16  95 % - -  08/06/12 0345 - 98.4 F (36.9 C) - - 14  - - -  08/06/12 0330 107/62 mmHg 98.4 F (36.9 C) - 89  15  100 % - -  08/06/12 0315 - 98.6 F (37 C) - 90  17  100 % - -  08/06/12 0300 83/63 mmHg 98.6 F (37 C) Core 90  16  100 % - -  08/06/12 0245 94/51 mmHg 98.6 F (37 C) - 90  15  100 % - -  08/06/12 0230 97/56 mmHg 98.6 F (37 C) - 90  15  100 % - -  08/06/12 0215 99/58 mmHg 98.6 F (37 C) - 90  17  100 % - -  08/06/12 0200 84/51 mmHg 98.6 F (37 C) - 90  15  100 % - -  08/06/12 0145 96/54 mmHg 98.6 F (37 C) - 90  15  100 % - -  08/06/12 0130 101/53 mmHg 98.4 F (36.9 C) - 90  14  100 % - -  08/06/12 0115 102/52 mmHg 98.4 F (36.9 C) - 90  14  100 % - -  08/06/12 0100 91/58 mmHg 98.2 F (36.8 C) - 90  23  100 % - -  08/06/12 0045 99/55 mmHg 98.4 F (36.9 C) - 89  18  100 % - -  08/06/12 0030 105/54 mmHg 98.2 F (36.8 C) - 91  16   100 % - -  08/06/12 0015 104/57 mmHg 98.2 F (36.8 C) - 90  16  100 % - -  08/06/12 0000 91/58 mmHg 98.2 F (36.8 C) - 90  14  100 % - -  08/05/12 2345 103/57 mmHg 98.2 F (36.8 C) - 90  16  100 % - -  08/05/12 2330 95/53 mmHg 98.6 F (37 C) - 90  13  100 % - -  08/05/12 2315 103/59 mmHg 98.4 F (36.9 C) - 90  15  100 % - -  08/05/12 2300 99/58 mmHg 98.4 F (36.9 C) - 91  18  100 % - -  08/05/12 2245 97/58 mmHg 98.4 F (36.9 C) - 90  17  100 % - -  08/05/12 2230 107/58 mmHg 98.4 F (36.9 C) - 90  16  100 % - -  08/05/12 2215 114/63 mmHg 98.4 F (36.9 C) - 90  17  100 % - -  08/05/12 2200 113/62 mmHg 98.2 F (36.8 C) Core 90  16  100 % - -  08/05/12 2145 95/54 mmHg 98.2 F (36.8 C) - 90  16  95 % - -  08/05/12 2130 108/59 mmHg 98.2 F (36.8 C) - 90  13  100 % - -  08/05/12 2115 109/57 mmHg 98.1 F (36.7 C) - 89  16  99 % - -  08/05/12 2100 92/56 mmHg 98.4 F (36.9 C) - 90  14  98 % - -  08/05/12 2045 62/52 mmHg 98.4 F (36.9 C) - 90  12  99 % - -  08/05/12 2030 106/59 mmHg 98.4 F (36.9 C) - 90  15  100 % - -  08/05/12 2015 90/53 mmHg 98.2 F (36.8 C) - 90  16  100 % - -  08/05/12 2000 98/53 mmHg 98.2 F (36.8 C) Core 90  15  100 % - -  08/05/12 1945 116/57 mmHg 98.1 F (36.7 C) - 90  20  99 % - -  08/05/12 1930 116/58 mmHg 98.2 F (36.8 C) - 90  18  99 % - -  08/05/12 1915 - 98.1 F (36.7 C) - 91  17  93 % - -  08/05/12 1900 87/71 mmHg 98.1 F (36.7 C) - 90  16  99 % - -  08/05/12 1845 - 98.1 F (36.7 C) - 90  16  100 % - -  08/05/12 1830 - 97.9 F (36.6 C) - 90  20  100 % - -  08/05/12 1825 107/58 mmHg 97.9 F (36.6 C) - 90  17  99 % - -  08/05/12 1815 - 97.7 F (36.5 C) - 90  15  100 % - -  08/05/12 1800 109/63 mmHg 97.5 F (36.4 C) Core 90  12  100 % - -  08/05/12 1745 - 97.3 F (36.3 C) - 90  12  100 % - -  08/05/12 1730 - 97 F (36.1 C) - 90  14  100 % - -  08/05/12 1715 - 97.2 F (36.2 C) - 90  14  100 % - -  08/05/12 1700 102/61 mmHg 97.2 F (36.2  C) - 90  12  100 % - -  08/05/12 1645 - 97 F (36.1 C) - 90  12  100 % - -  08/05/12 1630 - 96.8 F (36 C) - 90  12  100 % - -  08/05/12 1615 - 96.6 F (35.9 C) - 90  12  100 % - -  08/05/12 1600 92/54 mmHg 96.6 F (35.9 C) Core 90  12  100 % - -  08/05/12 1545 - 96.6 F (35.9 C) - 90  12  100 % - -  08/05/12 1530 - 96.6 F (35.9 C) - 90  12  100 % - -  08/05/12 1515 - 96.4 F (35.8 C) - 90  12  100 % - -  08/05/12 1500 102/64 mmHg 96.4 F (35.8 C) - 90  12  100 % - -  08/05/12 1445 - 96.4 F (35.8 C) - 90  12  100 % - -  08/05/12 1430 - 96.3 F (35.7 C) - 90  12  100 % - -  08/05/12 1415 - 96.4 F (35.8 C) - 90  12  100 % - -  08/05/12 1400 90/59 mmHg 96.8 F (36 C) Core 90  13  95 % - -  08/05/12 1345 135/65 mmHg 96.8 F (36 C) - 90  12  95 % - -  08/05/12 1340 143/56 mmHg - - 90  12  - 5\' 8"  (1.727 m) 153 lb 3.5 oz (69.5 kg)  08/05/12 1300 - - - - - - - 153 lb (69.4 kg)   Pre op weight  69.5 kg Current Weight  08/06/12 165 lb 2 oz (74.9 kg)    Hemodynamic parameters for last 24 hours: PAP: (23-43)/(8-21) 33/16 mmHg CO:  [2.9 L/min-5.7 L/min] 5.7 L/min CI:  [1.6 L/min/m2-3.1 L/min/m2] 3.1 L/min/m2  Intake/Output from previous day: 09/10 0701 - 09/11 0700 In: 7196.4 [P.O.:270; I.V.:4936.4; Blood:300; NG/GT:30; IV Piggyback:1660] Out: [id number] [Urine:3255; Emesis/NG output:50; Blood:1000; Chest Tube:260]   Physical Exam:  Cardiovascular: RRR Pulmonary: Diminished at bases bilaterally; no rales, wheezes, or rhonchi. Abdomen: Soft, non tender, bowel sounds present. Extremities: Mild bilateral lower extremity edema LLE;SCD on RLE Wounds: Dressings clean and dry.   Neurologic: Grossly intact without focal deficits  Lab Results: CBC: Basename 08/06/12 0355 08/05/12 1940  WBC 9.5 15.0*  HGB 8.2* 9.6*  HCT 24.1* 28.2*  PLT 135* 148*   BMET:  Basename 08/06/12 0355 08/05/12 1940 08/05/12 1911 08/04/12 1336  NA 137 -- 141 --  K 4.0 -- 4.6 --  CL 106 -- 108 --    CO2 22 -- -- 19  GLUCOSE 109* -- 138* --  BUN 15 -- 15 --  CREATININE 1.01 1.01 -- --  CALCIUM 7.9* -- -- 9.7    PT/INR:  Lab Results  Component Value Date   INR 1.61* 08/05/2012   INR 1.09 08/04/2012   INR 1.1* 07/23/2012   ABG:  INR: Will add last result for INR, ABG once components are confirmed Will add last 4 CBG results once components are confirmed  Assessment/Plan:  1. CV - SR.Wean Dopamine and Neo synephrine.Continue Lopressor 12.5 bid 2.  Pulmonary - Chest tubes with 260 cc of output.Encourage incentive spirometer.CXR this am shows bibasilar atelectasis L>R, small bilateral pleural effusions, and no ptx. 3. Volume Overload - Begin diuresis 4.  Acute blood loss anemia - H and H this am 8.2 and 24.1.Monitor 5.Mild thrombocytopenia-Platelets 135,000. 6.Pre op HGA1C 5.7. Pre diabetic.CBGs 164/143/97. Will need further surveillance as outpatient. 7.See progression orders.  ZIMMERMAN,DONIELLE MPA-C 08/06/2012   I have seen and examined Marc [name] and agree with the above assessment  and plan.  Grace Isaac MD Beeper [phone] Office [phone] 08/06/2012 8:20 AM

## 2012-08-06 NOTE — Care Management Note (Unsigned)
    Page 1 of 1   08/06/2012     2:41:37 PM   CARE MANAGEMENT NOTE 08/06/2012  Patient:  Marc [name], Marc [name]   Account Number:  [id number]  Date Initiated:  08/06/2012  Documentation initiated by:  Kenry Daubert  Subjective/Objective Assessment:   PT S/P CABG X 6 ON 07/1012.  PTA, PT INDPENDENT, LIVES WITH WIFE, AND WIFE'S CAREGIVER.  ALSO HAS SUPPORTIVE SON AND DAUGHTER TO ASSIST AT DC.     Action/Plan:   MET WITH PT AND FAMILY TO DISCUSS DC PLANS.  FAMILY TO PROVIDE CARE AT DISCHARGE.  PT HAS RW AND BSC AT HOME. WILL FOLLOW FOR HOME NEEDS AS PT PROGRESSES.   Anticipated DC Date:  08/10/2012   Anticipated DC Plan:  [place]  CM consult      Choice offered to / List presented to:             Status of service:  In process, will continue to follow Medicare Important Message given?   (If response is "NO", the following Medicare IM given date fields will be blank) Date Medicare IM given:   Date Additional Medicare IM given:    Discharge Disposition:    Per UR Regulation:  Reviewed for med. necessity/level of care/duration of stay  If discussed at [place] of Stay Meetings, dates discussed:    Comments:  08/06/12 Marc Sagen,RN,BSN 1115 PT HAS HX OF CVA; FAMILY REQUESTING STANDARD WHEELCHAIR FOR TRIPS AWAY FROM HOME TO MD APPTS, GROCERY STORE, ETC. WILL ASSIST WITH OBTAINING REQUESTED DME.

## 2012-08-06 NOTE — Progress Notes (Signed)
[facility] BRIEF SICU PROGRESS NOTE  1 Day Post-Op  S/P Procedure(s) (LRB): CORONARY ARTERY BYPASS GRAFTING (CABG) (N/A)   Stable day NSR BP stable O2 sats 97-100% on 2 L/min UOP adequate, diuresing some Labs okay w/ stable Hgb  Plan: Continue current plan  Jahaira Earnhart H 08/06/2012 6:18 PM

## 2012-08-07 ENCOUNTER — Encounter (HOSPITAL_COMMUNITY): Payer: Self-pay | Admitting: Cardiothoracic Surgery

## 2012-08-07 ENCOUNTER — Inpatient Hospital Stay (HOSPITAL_COMMUNITY): Payer: Medicare Other

## 2012-08-07 LAB — GLUCOSE, CAPILLARY
Glucose-Capillary: 102 mg/dL — ABNORMAL HIGH (ref 70–99)
Glucose-Capillary: 90 mg/dL (ref 70–99)
Glucose-Capillary: 107 mg/dL — ABNORMAL HIGH (ref 70–99)
Glucose-Capillary: 117 mg/dL — ABNORMAL HIGH (ref 70–99)
Glucose-Capillary: 157 mg/dL — ABNORMAL HIGH (ref 70–99)
Glucose-Capillary: 93 mg/dL (ref 70–99)

## 2012-08-07 LAB — BASIC METABOLIC PANEL
BUN: 15 mg/dL (ref 6–23)
CO2: 24 mEq/L (ref 19–32)
Calcium: 8.1 mg/dL — ABNORMAL LOW (ref 8.4–10.5)
Creatinine, Ser: 1.43 mg/dL — ABNORMAL HIGH (ref 0.50–1.35)
GFR calc non Af Amer: 45 mL/min — ABNORMAL LOW (ref >90)
Glucose, Bld: 95 mg/dL (ref 70–99)
Sodium: 133 mEq/L — ABNORMAL LOW (ref 135–145)

## 2012-08-07 LAB — CBC
MCH: 29.8 pg (ref 26.0–34.0)
MCHC: 33.2 g/dL (ref 30.0–36.0)
MCV: 89.7 fL (ref 78.0–100.0)
Platelets: 98 10*3/uL — ABNORMAL LOW (ref 150–400)
RBC: 2.52 MIL/uL — ABNORMAL LOW (ref 4.22–5.81)

## 2012-08-07 MED ORDER — BISACODYL 10 MG RE SUPP: 10.0000 mg | Freq: Every day | RECTAL | Status: DC | PRN

## 2012-08-07 MED ORDER — METOPROLOL TARTRATE 12.5 MG HALF TABLET
12.5000 mg | ORAL_TABLET | Freq: Two times a day (BID) | ORAL | Status: DC
  Administered 2012-08-07 – 2012-08-09 (×6): 12.5 mg via ORAL
  Filled 2012-08-07 (×8): qty 1

## 2012-08-07 MED ORDER — GUAIFENESIN ER 600 MG PO TB12
600.0000 mg | ORAL_TABLET | Freq: Two times a day (BID) | ORAL | Status: DC | PRN
  Filled 2012-08-07: qty 1

## 2012-08-07 MED ORDER — FERROUS SULFATE 325 (65 FE) MG PO TABS
325.0000 mg | ORAL_TABLET | Freq: Two times a day (BID) | ORAL | Status: DC
  Administered 2012-08-07 – 2012-08-09 (×5): 325 mg via ORAL
  Filled 2012-08-07 (×8): qty 1

## 2012-08-07 MED ORDER — OXYCODONE HCL 5 MG PO TABS
5.0000 mg | ORAL_TABLET | ORAL | Status: DC | PRN
  Administered 2012-08-07 – 2012-08-08 (×4): 10 mg via ORAL
  Filled 2012-08-07 (×6): qty 2

## 2012-08-07 MED ORDER — SODIUM CHLORIDE 0.9 % IJ SOLN
3.0000 mL | Freq: Two times a day (BID) | INTRAMUSCULAR | Status: DC
  Administered 2012-08-07 – 2012-08-09 (×4): 3 mL via INTRAVENOUS

## 2012-08-07 MED ORDER — SODIUM CHLORIDE 0.9 % IV SOLN: 250.0000 mL | INTRAVENOUS | Status: DC | PRN

## 2012-08-07 MED ORDER — ATORVASTATIN CALCIUM 20 MG PO TABS
20.0000 mg | ORAL_TABLET | Freq: Every day | ORAL | Status: DC
  Administered 2012-08-07 – 2012-08-09 (×3): 20 mg via ORAL
  Filled 2012-08-07 (×4): qty 1

## 2012-08-07 MED ORDER — BISMUTH SUBSALICYLATE 262 MG/15ML PO SUSP
30.0000 mL | ORAL | Status: DC | PRN
  Filled 2012-08-07: qty 236

## 2012-08-07 MED ORDER — INSULIN ASPART 100 UNIT/ML ~~LOC~~ SOLN
0.0000 [IU] | Freq: Three times a day (TID) | SUBCUTANEOUS | Status: DC
  Administered 2012-08-07 – 2012-08-09 (×5): 2 [IU] via SUBCUTANEOUS

## 2012-08-07 MED ORDER — ASPIRIN EC 81 MG PO TBEC
81.0000 mg | DELAYED_RELEASE_TABLET | Freq: Every day | ORAL | Status: DC
  Administered 2012-08-07 – 2012-08-09 (×3): 81 mg via ORAL
  Filled 2012-08-07 (×4): qty 1

## 2012-08-07 MED ORDER — FOLIC ACID 1 MG PO TABS
1.0000 mg | ORAL_TABLET | Freq: Every day | ORAL | Status: DC
  Administered 2012-08-07 – 2012-08-09 (×3): 1 mg via ORAL
  Filled 2012-08-07 (×4): qty 1

## 2012-08-07 MED ORDER — BISACODYL 5 MG PO TBEC
10.0000 mg | DELAYED_RELEASE_TABLET | Freq: Every day | ORAL | Status: DC | PRN
  Administered 2012-08-07: 10 mg via ORAL
  Filled 2012-08-07: qty 2

## 2012-08-07 MED ORDER — MOVING RIGHT ALONG BOOK
Freq: Once | Status: AC
  Administered 2012-08-07: 1
  Filled 2012-08-07: qty 1

## 2012-08-07 MED ORDER — TRAMADOL HCL 50 MG PO TABS
50.0000 mg | ORAL_TABLET | ORAL | Status: DC | PRN
  Administered 2012-08-07 – 2012-08-08 (×3): 100 mg via ORAL
  Filled 2012-08-07 (×3): qty 2

## 2012-08-07 MED ORDER — BISMUTH SUBSALICYLATE 262 MG PO CHEW
262.0000 mg | CHEWABLE_TABLET | ORAL | Status: DC | PRN
  Filled 2012-08-07 (×2): qty 1

## 2012-08-07 MED ORDER — POTASSIUM CHLORIDE CRYS ER 10 MEQ PO TBCR
10.0000 meq | EXTENDED_RELEASE_TABLET | Freq: Every day | ORAL | Status: DC
  Administered 2012-08-07: 10 meq via ORAL
  Filled 2012-08-07 (×2): qty 1

## 2012-08-07 MED ORDER — DOCUSATE SODIUM 100 MG PO CAPS
200.0000 mg | ORAL_CAPSULE | Freq: Every day | ORAL | Status: DC
  Administered 2012-08-07: 200 mg via ORAL
  Filled 2012-08-07 (×4): qty 2

## 2012-08-07 MED ORDER — LEVALBUTEROL HCL 0.63 MG/3ML IN NEBU
0.6300 mg | INHALATION_SOLUTION | Freq: Four times a day (QID) | RESPIRATORY_TRACT | Status: DC | PRN
  Filled 2012-08-07: qty 3

## 2012-08-07 MED ORDER — SODIUM CHLORIDE 0.9 % IJ SOLN
3.0000 mL | INTRAMUSCULAR | Status: DC | PRN
  Administered 2012-08-07: 3 mL via INTRAVENOUS

## 2012-08-07 MED ORDER — ONDANSETRON HCL 4 MG PO TABS: 4.0000 mg | ORAL_TABLET | Freq: Four times a day (QID) | ORAL | Status: DC | PRN

## 2012-08-07 MED ORDER — ONDANSETRON HCL 4 MG/2ML IJ SOLN: 4.0000 mg | Freq: Four times a day (QID) | INTRAMUSCULAR | Status: DC | PRN

## 2012-08-07 MED ORDER — PANTOPRAZOLE SODIUM 40 MG PO TBEC
40.0000 mg | DELAYED_RELEASE_TABLET | Freq: Every day | ORAL | Status: DC
  Administered 2012-08-08 – 2012-08-09 (×2): 40 mg via ORAL
  Filled 2012-08-07 (×2): qty 1

## 2012-08-07 MED ORDER — FUROSEMIDE 40 MG PO TABS
40.0000 mg | ORAL_TABLET | Freq: Every day | ORAL | Status: AC
  Administered 2012-08-07 – 2012-08-09 (×3): 40 mg via ORAL
  Filled 2012-08-07 (×3): qty 1

## 2012-08-07 MED FILL — Magnesium Sulfate Inj 50%: INTRAMUSCULAR | Qty: 2 | Status: AC

## 2012-08-07 MED FILL — Potassium Chloride Inj 2 mEq/ML: INTRAVENOUS | Qty: 40 | Status: AC

## 2012-08-07 NOTE — Plan of Care (Signed)
Problem: Phase III Progression Outcomes Goal: Transfer to PCTU/Telemetry POD Outcome: Completed/Met Date Met:  08/07/12 Transferred to 2017 via wheelchair and propak and O2 without distress noted.  Walked 23ft. Prior to transfer without difficulty.  Placed in bed and on monitor in 2017

## 2012-08-07 NOTE — Progress Notes (Addendum)
CARDIAC REHAB PHASE I   PRE:  Rate/Rhythm: 85 SR  BP:  Supine: 130/60  Sitting:   Standing:    SaO2: 93 RA  MODE:  Ambulation: 350 ft   POST:  Rate/Rhythem: 122 ST  BP:  Supine: 140/58  Sitting:   Standing:    SaO2: 94 RA 1345-1430 Assisted X 1 and used walker to ambulate. Gait steady with walker VS stable. Pt to bathroom after walk then to bed.Bed alarm on and call light in reach.  Deon Pilling

## 2012-08-07 NOTE — Op Note (Signed)
Marc [name], Marc [name]               ACCOUNT NO.:  [id number]  MEDICAL RECORD NO.:  [id number]  LOCATION:  2302                         FACILITY:  [facility]  PHYSICIAN:  Lanelle Bal, MD    DATE OF BIRTH:  1930/11/19  DATE OF PROCEDURE:  08/05/2012 DATE OF DISCHARGE:                              OPERATIVE REPORT   PREOPERATIVE DIAGNOSIS:  Positive Stress Test, three vessel coronary artery and abdominal aortic aneurysm.  POSTOP DIAGNOSIS:  Positive Stress Test, three vessel coronary artery and abdominal aortic aneurysm.  SURGICAL PROCEDURE:  Coronary artery bypass grafting x6 with the left internal mammary to the left anterior descending coronary artery, reverse saphenous vein graft to the diagonal coronary artery, sequential reverse saphenous vein graft to the first and second obtuse marginal, sequential reverse saphenous vein graft to the distal right coronary artery, and the distal circumflex coronary artery.  With left thigh and calf endo vein harvesting.  SURGEON:  Lanelle Bal, MD  FIRST ASSISTANT:  Lars Pinks, PA  BRIEF HISTORY:  The patient is an 78 year old male with a long-term history of smoking, who has been followed for abdominal aortic aneurysm, that has now reached 6 cm in size.  Preop evaluation prior to abdominal vascular surgery included an abnormal stress test and cardiac catheterization that revealed severe 3 vessel coronary artery disease with greater than 87% stenosis of the large right coronary artery, 80% stenosis of the LAD, 80% of the diagonal and severe circumflex disease, with greater than 90% stenosis of the distal circumflex.  Because of his positive stress test, and severe three-vessel coronary artery disease, coronary artery bypass grafting was recommended prior to abdominal vascular surgery.  The patient agreed and signed informed consent.  DESCRIPTION OF PROCEDURE:  With Swan-Ganz and arterial line monitors in place, the patient  underwent general endotracheal anesthesia without incident.  The chest and legs were prepped with Betadine and draped in usual sterile manner.  Using the [employer] endo vein harvesting system, vein was harvested from the left thigh and calf and was of good quality and caliber.  Median sternotomy was performed.  Left internal mammary artery was dissected down as a pedicle graft.  The distal artery was divided had good free flow.  Pericardium was opened.  Overall ventricular function was preserved.  The patient was systemically heparinized.  Ascending aorta was cannulated.  The right atrium was cannulated and aortic root vent cardioplegia needle was introduced into the ascending aorta.  The patient was placed on cardiopulmonary bypass 2.4 L/m2.  Sites of anastomosis were selected and dissected out of the epicardium.  The patient's body temperature was cooled to 32 degrees. Aortic crossclamp was applied 500 mL of cold blood potassium cardioplegia was administered with diastolic arrest of the heart. Myocardial septal temperatures were monitored throughout the crossclamp period.  Attention was turned first to the distal right coronary artery, which was opened and was a easily admitted a 1.5 mm probe.  Using a longitudinal side-to-side anastomosis was carried out with a running 7-0 Prolene.  Distal end of the same vein was then carried to the distal circumflex which was a smaller vessel, but did admit a 1.5 mm probe. Using a running 7-0  Prolene, distal anastomosis was performed.  The heart was then elevated and the first and second obtuse marginals were identified.  The first obtuse marginal, was a smaller vessel, approximately 1.2-1.3 mm in size.  Using a diamond-type side-to-side anastomosis with a running 8-0 Prolene, a distal anastomosis was performed.  Distal extent of the same vein was then carried to the second  obtuse marginal, which was intramyocardial and slightly larger admitted 1.5 mm  probe.  Using a running 8-0 Prolene, distal anastomosis was performed.  Additional cold blood cardioplegia was administered down the vein grafts.  Attention was then turned to the diagonal coronary artery.  Just prior to its branching with the vessel was opened and admitted a 1.5 mm probe.  Using a running 7-0 Prolene, distal anastomosis was performed.  Additional blood cardioplegia was administered down the vein graft.  Attention was then turned to the left anterior descending coronary artery which was opened using a running 8-0 Prolene, left internal mammary artery was anastomosed to left anterior descending coronary artery.  With release of the bulldog on the mammary artery with rise in myocardial septal temperature.  Bulldog was placed back on the mammary artery.  Additional cold blood cardioplegia was administered.  With crossclamp still in place, 3 punch aortotomies were performed and each of the 3 vein grafts were anastomosed to the ascending aorta.  Air was evacuated from the grafts partial occluding clamp was removed.  The bulldog on the mammary artery was removed.  The patient initially was asystolic after removal of the crossclamp and required DDD pacing however prior to the end of the case he had been converted to sinus rhythm, was atrially paced only to increase rate. Sites of anastomosis were inspected free of bleeding.  He was then ventilated and weaned from cardiopulmonary bypass without difficulty, remained hemodynamically stable.  He was decannulated in usual fashion. Protamine sulfate was administered with operative field hemostatic. Atrial and ventricular pacing wires had been applied.  The left pleural tube and a Blake mediastinal drain were left in place.  Pericardium was reapproximated sternum was closed with #6 stainless wire.  Fascia closed with interrupted 0 Vicryl, running 3-0 Vicryl in subcutaneous tissue, and 4-0 subcuticular stitch in skin edges.  Dry  dressings were applied. Sponge and needle count was reported as correct at completion procedure. Patient tolerated the procedure without obvious complication and was transferred to the Surgical Intensive Care Unit for further postoperative care.     Lanelle Bal, MD     EG/MEDQ  D:  08/06/2012  T:  08/07/2012  Job:  [id number]

## 2012-08-07 NOTE — Progress Notes (Signed)
Patient ID: JABRELL RACZKOWSKI, male   DOB: Apr 26, 1930, 78 y.o.   MRN: [id number] [facility] DAILY PROGRESS NOTE                   [address]            [place] [address]          [phone]      2 Days Post-Op Procedure(s) (LRB): CORONARY ARTERY BYPASS GRAFTING (CABG) (N/A)  Total Length of Stay:  LOS: 2 days   Subjective: Up to chair, alert sitting in chair, has not walked yet  Objective: Vital signs in last 24 hours: Temp:  [98 F (36.7 C)-99.1 F (37.3 C)] 99.1 F (37.3 C) (09/12 0735) Pulse Rate:  [72-91] 79  (09/12 0800) Cardiac Rhythm:  [-] Normal sinus rhythm (09/12 0600) Resp:  [12-22] 16  (09/12 0800) BP: (85-123)/(46-80) 108/61 mmHg (09/12 0800) SpO2:  [95 %-100 %] 100 % (09/12 0800) Weight:  [165 lb 9.1 oz (75.1 kg)] 165 lb 9.1 oz (75.1 kg) (09/12 0600)  Filed Weights   08/05/12 1340 08/06/12 0600 08/07/12 0600  Weight: 153 lb 3.5 oz (69.5 kg) 165 lb 2 oz (74.9 kg) 165 lb 9.1 oz (75.1 kg)    Weight change: 12 lb 9.1 oz (5.7 kg)   Hemodynamic parameters for last 24 hours: PAP: (29-35)/(13-17) 35/17 mmHg  Intake/Output from previous day: 09/11 0701 - 09/12 0700 In: 1653.1 [P.O.:960; I.V.:591.1; IV Piggyback:102] Out: 2450 [Urine:2330; Chest Tube:120]  Intake/Output this shift: Total I/O In: 1020 [I.V.:1020] Out: 95 [Urine:75; Chest Tube:20]  Current Meds: Scheduled Meds:   . acetaminophen  1,000 mg Oral Q6H   Or  . acetaminophen (TYLENOL) oral liquid 160 mg/5 mL  975 mg Per Tube Q6H  . aspirin EC  325 mg Oral Daily   Or  . aspirin  324 mg Per Tube Daily  . bisacodyl  10 mg Oral Daily   Or  . bisacodyl  10 mg Rectal Daily  . cefUROXime (ZINACEF)  IV  1.5 g Intravenous Q12H  . docusate sodium  200 mg Oral Daily  . furosemide  20 mg Intravenous BID  . insulin aspart  0-24 Units Subcutaneous Q4H  . insulin aspart  0-24 Units Subcutaneous Q4H  . metoprolol tartrate  12.5 mg Oral BID   Or  . metoprolol tartrate  12.5 mg Per Tube BID    . multivitamin with minerals  1 tablet Oral Daily  . mupirocin ointment   Nasal BID  . pantoprazole  40 mg Oral Q1200  . potassium chloride  20 mEq Oral Once  . sodium chloride  3 mL Intravenous Q12H   Continuous Infusions:   . sodium chloride 20 mL/hr at 08/06/12 0100  . sodium chloride 20 mL/hr at 08/05/12 2342  . sodium chloride    . dexmedetomidine Stopped (08/06/12 0400)  . DOPamine Stopped (08/06/12 1500)  . lactated ringers 20 mL/hr at 08/07/12 0800  . nitroGLYCERIN Stopped (08/05/12 1605)  . phenylephrine (NEO-SYNEPHRINE) Adult infusion Stopped (08/06/12 1300)   PRN Meds:.metoprolol, morphine injection, ondansetron (ZOFRAN) IV, oxyCODONE, sodium chloride  General appearance: alert, cooperative and no distress Neurologic: intact Heart: regular rate and rhythm, S1, S2 normal, no murmur, click, rub or gallop and normal apical impulse Lungs: clear to auscultation bilaterally and normal percussion bilaterally Abdomen: soft, non-tender; bowel sounds normal; no masses,  no organomegaly Extremities: extremities normal, atraumatic, no cyanosis or edema and Homans sign is negative, no sign of DVT  Wound: sternum stable  Lab Results: CBC: Basename 08/07/12 0410 08/06/12 1742 08/06/12 1700  WBC 9.8 -- 11.4*  HGB 7.5* 8.5* --  HCT 22.6* 25.0* --  PLT 98* -- 126*   BMET:  Basename 08/07/12 0410 08/06/12 1742 08/06/12 0355  NA 133* 138 --  K 3.8 4.0 --  CL 101 102 --  CO2 24 -- 22  GLUCOSE 95 113* --  BUN 15 14 --  CREATININE 1.43* 1.30 --  CALCIUM 8.1* -- 7.9*    PT/INR:  Basename 08/05/12 1348  LABPROT 19.4*  INR 1.61*   Radiology: Dg Chest Portable 1 View In Am  08/06/2012  *RADIOLOGY REPORT*  Clinical Data: CABG.  PORTABLE CHEST - 1 VIEW  Comparison: 08/05/2012.  Findings: Interval extubation.  Nasogastric tube has been removed. Right IJ Swan-Ganz catheter tip projects over the proximal right pulmonary artery.  Epicardial pacer wires and mediastinal/left chest drains  remain in place.  Heart size stable.  Lungs are somewhat low in volume with probable bibasilar atelectasis.  No definite edema.  Difficult to exclude small left pleural effusion. No definite pneumothorax.  IMPRESSION:  1.  Low lung volumes with probable bibasilar atelectasis. 2.  Possible small left pleural effusion. 3.  No definite pneumothorax with left chest tube in place.   Original Report Authenticated By: Luretha Rued, M.D.    Dg Chest Portable 1 View  08/05/2012  *RADIOLOGY REPORT*  Clinical Data: Status post CABG  PORTABLE CHEST - 1 VIEW  Comparison: August 04, 2012  Findings: There is mild atelectasis of left lung base with a small left pleural effusion.  Mediastinal drain, left sided chest tube, endotracheal tube, nasogastric tube, and Swan-Ganz catheter are identified in good position.  The patient status post median sternotomy.  IMPRESSION: Postsurgical changes as described.  Mild atelectasis of left lung base with a small left pleural effusion.   Original Report Authenticated By: Abelardo Diesel, M.D.      Assessment/Plan: S/P Procedure(s) (LRB): CORONARY ARTERY BYPASS GRAFTING (CABG) (N/A) Expected Acute  Blood - loss Anemia, will try to avoid transfusion Diuresis and mobilize Mild acute  renal insufficiency stage 2 cr 1.4 To step down    Grace Isaac MD  Beeper [phone] Office [phone] 08/07/2012 8:12 AM

## 2012-08-08 ENCOUNTER — Inpatient Hospital Stay (HOSPITAL_COMMUNITY): Payer: Medicare Other

## 2012-08-08 LAB — CBC
HCT: 25.2 % — ABNORMAL LOW (ref 39.0–52.0)
Hemoglobin: 8.4 g/dL — ABNORMAL LOW (ref 13.0–17.0)
MCH: 29.8 pg (ref 26.0–34.0)
MCHC: 33.3 g/dL (ref 30.0–36.0)
MCV: 89.4 fL (ref 78.0–100.0)
Platelets: 130 10*3/uL — ABNORMAL LOW (ref 150–400)
RBC: 2.82 MIL/uL — ABNORMAL LOW (ref 4.22–5.81)
RDW: 13.9 % (ref 11.5–15.5)
WBC: 9.1 10*3/uL (ref 4.0–10.5)

## 2012-08-08 LAB — BASIC METABOLIC PANEL
BUN: 16 mg/dL (ref 6–23)
CO2: 24 mEq/L (ref 19–32)
Calcium: 8.7 mg/dL (ref 8.4–10.5)
Chloride: 102 mEq/L (ref 96–112)
Creatinine, Ser: 1.38 mg/dL — ABNORMAL HIGH (ref 0.50–1.35)
GFR calc Af Amer: 54 mL/min — ABNORMAL LOW (ref >90)
GFR calc non Af Amer: 47 mL/min — ABNORMAL LOW (ref >90)
Glucose, Bld: 118 mg/dL — ABNORMAL HIGH (ref 70–99)
Potassium: 3.4 mEq/L — ABNORMAL LOW (ref 3.5–5.1)
Sodium: 137 mEq/L (ref 135–145)

## 2012-08-08 LAB — GLUCOSE, CAPILLARY
Glucose-Capillary: 127 mg/dL — ABNORMAL HIGH (ref 70–99)
Glucose-Capillary: 129 mg/dL — ABNORMAL HIGH (ref 70–99)
Glucose-Capillary: 135 mg/dL — ABNORMAL HIGH (ref 70–99)
Glucose-Capillary: 147 mg/dL — ABNORMAL HIGH (ref 70–99)

## 2012-08-08 MED ORDER — POTASSIUM CHLORIDE CRYS ER 20 MEQ PO TBCR
40.0000 meq | EXTENDED_RELEASE_TABLET | Freq: Once | ORAL | Status: AC
  Administered 2012-08-08: 40 meq via ORAL
  Filled 2012-08-08: qty 2

## 2012-08-08 MED ORDER — FOLIC ACID 1 MG PO TABS: 1.0000 mg | ORAL_TABLET | Freq: Every day | ORAL | Status: DC

## 2012-08-08 MED ORDER — FERROUS SULFATE 325 (65 FE) MG PO TABS: 325.0000 mg | ORAL_TABLET | Freq: Two times a day (BID) | ORAL | Status: DC

## 2012-08-08 MED ORDER — FUROSEMIDE 40 MG PO TABS: 40.0000 mg | ORAL_TABLET | Freq: Every day | ORAL | Status: DC

## 2012-08-08 MED ORDER — LACTULOSE 10 GM/15ML PO SOLN
20.0000 g | Freq: Once | ORAL | Status: AC
  Administered 2012-08-08: 20 g via ORAL
  Filled 2012-08-08: qty 30

## 2012-08-08 MED ORDER — METOPROLOL TARTRATE 25 MG PO TABS: 12.5000 mg | ORAL_TABLET | Freq: Two times a day (BID) | ORAL | Status: DC

## 2012-08-08 MED ORDER — TRAMADOL HCL 50 MG PO TABS: 50.0000 mg | ORAL_TABLET | ORAL | Status: AC | PRN

## 2012-08-08 MED ORDER — POTASSIUM CHLORIDE CRYS ER 20 MEQ PO TBCR: 20.0000 meq | EXTENDED_RELEASE_TABLET | Freq: Every day | ORAL | Status: DC

## 2012-08-08 MED ORDER — ENSURE PUDDING PO PUDG
1.0000 | Freq: Three times a day (TID) | ORAL | Status: DC
  Administered 2012-08-09 (×2): 1 via ORAL

## 2012-08-08 MED ORDER — POTASSIUM CHLORIDE CRYS ER 20 MEQ PO TBCR
20.0000 meq | EXTENDED_RELEASE_TABLET | Freq: Every day | ORAL | Status: DC
  Administered 2012-08-09: 20 meq via ORAL
  Filled 2012-08-08 (×3): qty 1

## 2012-08-08 MED ORDER — ATORVASTATIN CALCIUM 20 MG PO TABS: 20.0000 mg | ORAL_TABLET | Freq: Every day | ORAL | Status: DC

## 2012-08-08 MED ORDER — POTASSIUM CHLORIDE CRYS ER 20 MEQ PO TBCR
20.0000 meq | EXTENDED_RELEASE_TABLET | Freq: Once | ORAL | Status: AC
  Administered 2012-08-08: 20 meq via ORAL

## 2012-08-08 NOTE — Discharge Summary (Signed)
Physician Discharge Summary  Patient ID: Marc [name] MRN: [id number] DOB/AGE: 1930/04/05 78 y.o.  Admit date: 08/05/2012 Discharge date: 08/10/2012  Admission Diagnoses: 1. Multivessel CAD 2.History of hypertension 3.History of hyperlipidemia 4.History of AAA 5.History of CVA 6.History of GERD 7.History of tobacco abuse  Discharge Diagnoses:  1. Multivessel CAD 2.History of hypertension 3.History of hyperlipidemia 4.History of AAA 5.History of CVA 6.History of GERD 7.History of tobacco abuse 8.Mild thrombocytopenia 9.ABL anemia 10.Pre diabetes (HGA1C 5.7)   Procedure (s):  Coronary artery bypass grafting x6 with the left  internal mammary to the left anterior descending coronary artery,  reverse saphenous vein graft to the diagonal coronary artery, sequential reverse saphenous vein graft to the first and second obtuse marginal, sequential reverse saphenous vein graft to the distal right coronary artery, and the distal circumflex coronary artery. With left thigh and calf endo vein harvesting by Dr. Servando Snare on 08/05/2012.  History of Presenting Illness: This is an 78 year old male who has been followed by Dr. Donnetta Hutching for an abdominal aortic aneurysm. On recent follow up, it was noted to have increased in size.Six months ago an ultrasound showed it was 5.5 cm; recent CT of the abdomen showed abdominal aortic aneurysm of 6.0 cm. The patient was referred to Dr. Annette Stable for cardiac clearance. A Cardiolite stress test was reported as positive. As a result, a cardiac catheterization was done. Results showed  severe three-vessel coronary artery disease. The patient denied any definite chest discomfort. He did note increasing fatigue with exertion and did have shortness of breath with exertion. He's had no history of myocardial infarction or previous angioplasty. He is a known long-term smoker and continues to smoke. He is unaware of lipid status. He has had a previous stroke 2 years ago  with right-sided numbness and slurred speech. He denied any recent neurologic symptoms. He was seen and evaluated by Dr. Servando Snare for coronary artery bypass grafting surgery. Potential risks, complications, and benefits of the surgery were discussed with the patient and he agreed to proceed. He was admitted on 08/05/2012 in order to undergo a CABGx 6.  Brief Hospital Course:  He was extubated without difficulty early the evening of surgery. His Gordy Councilman, a line, chest tubes, and foley were all removed early in his post operative course.He was weaned off of Neo synephrine. He was then started on Lopressor. He was volume overloaded and diuresed accordingly.He did have ABL anemia. His H and H went as low as 7.5 and 22.6. He was placed on Ferrous sulfate. His last H and H was 8.4 and 25.2.He also had mild thrombocytopenia. His last platelet count was up to 130,000. He was felt surgically stable for transfer from the ICU to [facility] for further convalescence on 08/07/2012.He is ambulating fairly well on room air. He is tolerating a diet and has had a bowel movement. His epicardial pacing wires will be removed in the morning. Chest tube sutures will be removed on the day of discharge. Provided he remains afebrile, hemodynamically stable, and pending morning round evaluation, he will be surgically stable for discharge on 9/15 2013.   Latest Vital Signs: Blood pressure 127/64, pulse 96, temperature 97.9 F (36.6 C), temperature source Oral, resp. rate 18, height 5\' 8"  (1.727 m), weight 162 lb 1.6 oz (73.528 kg), SpO2 96.00%.  Physical Exam: Cardiovascular: RRR  Pulmonary: Diminished at bases bilaterally; no rales, wheezes, or rhonchi.  Abdomen: Soft, non tender, bowel sounds present.  Extremities: Mild bilateral lower extremity edema  Wounds: Clean  and dry.    Discharge Condition:Stable  Recent laboratory studies:  Lab Results  Component Value Date   WBC 9.1 08/08/2012   HGB 8.4* 08/08/2012   HCT 25.2*  08/08/2012   MCV 89.4 08/08/2012   PLT 130* 08/08/2012   Lab Results  Component Value Date   NA 137 08/08/2012   K 3.4* 08/08/2012   CL 102 08/08/2012   CO2 24 08/08/2012   CREATININE 1.38* 08/08/2012   GLUCOSE 118* 08/08/2012      Diagnostic Studies: Dg Chest 2 View  08/08/2012  *RADIOLOGY REPORT*  Clinical Data: Post chest tube removal  CHEST - 2 VIEW  Comparison: 08/07/2012; 08/06/2012; 08/05/2012; 08/04/2012  Findings: Grossly unchanged cardiac silhouette mediastinal contours post median sternotomy and CABG. Epicardial electrical wires persist, otherwise, interval removal of support apparatus. Interval removal of support apparatus.  No pneumothorax.  Grossly unchanged small bilateral effusions and bibasilar heterogeneous opacities.  No new focal airspace opacities.  Peculiar crescentic shaped lucency is seen both on the PA and lateral radiograph.  Unchanged bones including chronic deformity of the posterior lateral aspect of the left fifth rib.  IMPRESSION: 1.  Interval removal of support apparatus.  No definite pneumothorax.  2.  Small bilateral effusions and bibasilar opacities, favored to represent atelectasis. 3.  Peculiar apparent right-sided infradiaphragmatic lucency may represent aerated lung in the background of atelectasis, though pneumoperitoneum may have a similar appearance.  Clinical correlation is advised.  Further evaluation with dedicated abdominal radiographic series may be performed as clinically indicated.  Above findings discussed with Leory Plowman, RN at [phone].   Original Report Authenticated By: Rachel Moulds, M.D.     Discharge Orders    Future Appointments: Provider: Department: Dept Phone: Center:   09/04/2012 9:30 AM Grace Isaac, MD [facility] [phone] [facility]      Discharge Medications:   Medication List     As of 08/08/2012  1:52 PM    STOP taking these medications         ARTHRITIS STRENGTH BC POWDER PO      TAKE these medications          aspirin EC 81 MG tablet   Take 81 mg by mouth daily.      atorvastatin 20 MG tablet   Commonly known as: LIPITOR   Take 1 tablet (20 mg total) by mouth daily at 6 PM.      ferrous sulfate 325 (65 FE) MG tablet   Take 1 tablet (325 mg total) by mouth 2 (two) times daily with a meal. For one month then may stop.      folic acid 1 MG tablet   Commonly known as: FOLVITE   Take 1 tablet (1 mg total) by mouth daily. For one month then stop.      furosemide 40 MG tablet   Commonly known as: LASIX   Take 1 tablet (40 mg total) by mouth daily. For 5 days then stop.      IMODIUM A-D PO   Take 1 capsule by mouth 2 (two) times daily as needed. For loose stool      KAOPECTATE PO   Take 15 mLs by mouth as needed. For upset stomach      metoprolol tartrate 25 MG tablet   Commonly known as: LOPRESSOR   Take 0.5 tablets (12.5 mg total) by mouth 2 (two) times daily.      multivitamin with minerals Tabs   Take 1 tablet by mouth daily.  PEPTO-BISMOL PO   Take 15 mLs by mouth as needed. For upset stomach      potassium chloride SA 20 MEQ tablet   Commonly known as: K-DUR,KLOR-CON   Take 1 tablet (20 mEq total) by mouth daily. For 5 days then stop.      ranitidine 150 MG capsule   Commonly known as: ZANTAC   Take 150 mg by mouth 2 (two) times daily as needed. For acid reflux      traMADol 50 MG tablet   Commonly known as: ULTRAM   Take 1-2 tablets (50-100 mg total) by mouth every 4 (four) hours as needed.        Follow Up Appointments:     Follow-up Information    Schedule an appointment as soon as possible for a visit with Darden Amber., MD. (For an appointment for 2 weeks)    Contact information:   [address] [place] [address] [phone]       Follow up with GERHARDT,EDWARD B, MD. (PA/LAT CXR to be taken (at [facility] which is in the same building as Dr. Everrett Coombe office) on 09/04/2012 at 8:30 am;Appointment with Dr. Servando Snare is on  09/04/2012 at 10:30 am  )    Contact information:   [address] [place] [address] [phone]       Schedule an appointment as soon as possible for a visit with EARLY, TODD, MD. (For an appointment regarding AAA)    Contact information:   [place] [address] [phone]       Schedule an appointment as soon as possible for a visit with Leonard Downing, MD. (Regarding HGA1C 5.7)    Contact information:   [place] [address] [phone]          Signed: Lars Pinks MPA-C 08/08/2012, 1:52 PM

## 2012-08-08 NOTE — Progress Notes (Signed)
Pt refused to walk tonight. Per Pt. He walked already and ready to go to bed. Teach him the importance of ambulation and encourage him to walk tomorrow morning.

## 2012-08-08 NOTE — Progress Notes (Addendum)
3 Days Post-Op Procedure(s) (LRB): CORONARY ARTERY BYPASS GRAFTING (CABG) (N/A)    Subjective: Patient with not much appetite-denies abdominal pain, nausea, or emesis.    Objective: Vital signs in last 24 hours: Patient Vitals for the past 24 hrs:  BP Temp Temp src Pulse Resp SpO2 Weight  08/08/12 0447 127/64 mmHg 97.9 F (36.6 C) Oral 96  18  96 % 162 lb 1.6 oz (73.528 kg)  08/07/12 2030 135/56 mmHg 98.4 F (36.9 C) Axillary 107  18  91 % -  08/07/12 1307 135/60 mmHg 98.4 F (36.9 C) Oral 101  18  90 % -  08/07/12 1059 126/64 mmHg 98.2 F (36.8 C) Oral 85  16  91 % -   Pre op weight  69.5 kg Current Weight  08/08/12 162 lb 1.6 oz (73.528 kg)      Intake/Output from previous day: 09/12 0701 - 09/13 0700 In: 1026 [I.V.:1026] Out: 695 [Urine:675; Chest Tube:20]   Physical Exam:  Cardiovascular: RRR Pulmonary: Diminished at bases bilaterally; no rales, wheezes, or rhonchi. Abdomen: Soft, non tender, bowel sounds present. Extremities: Mild bilateral lower extremity edema Wounds: Clean and dry.     Lab Results: CBC:  Basename 08/08/12 0515 08/07/12 0410  WBC 9.1 9.8  HGB 8.4* 7.5*  HCT 25.2* 22.6*  PLT 130* 98*   BMET:   Basename 08/08/12 0515 08/07/12 0410  NA 137 133*  K 3.4* 3.8  CL 102 101  CO2 24 24  GLUCOSE 118* 95  BUN 16 15  CREATININE 1.38* 1.43*  CALCIUM 8.7 8.1*    PT/INR:  Lab Results  Component Value Date   INR 1.61* 08/05/2012   INR 1.09 08/04/2012   INR 1.1* 07/23/2012   ABG:  INR: Will add last result for INR, ABG once components are confirmed Will add last 4 CBG results once components are confirmed  Assessment/Plan:  1. CV - SR.Continue Lopressor 12.5 bid 2.  Pulmonary - Encourage incentive spirometer.CXR this am shows bibasilar atelectasis L>R, small bilateral pleural effusions, and no ptx.Appears to have an infradiaphragmatic lucency-questionable pneumoperitoneum.He has no abdominal tenderness, nausea, or emesis. 3. Volume  Overload - Continue lasix 4.  Acute blood loss anemia - H and H this am 8.4 and 25.2.Continue ferrous sulfate and folic acid. 5.Mild thrombocytopenia-Platelets 130,000. 6.Pre op HGA1C 5.7. Pre diabetic.CBGs 157/93/127. Will need further surveillance as outpatient. 7.Supplement potassium 8.Remove EPW in am 9.Ensure tid with meals 10.LOC constipation 11.Creatinine decreased to 1.38 12.Possibly home Sunday  ZIMMERMAN,DONIELLE MPA-C 08/08/2012 10:47 AM  Home Sunday I have seen and examined Marc [name] and agree with the above assessment  and plan.  Grace Isaac MD Beeper [phone] Office [phone] 08/08/2012 12:53 PM

## 2012-08-08 NOTE — Progress Notes (Addendum)
CARDIAC REHAB PHASE I   PRE:  Rate/Rhythm: 85 SR  BP:  Supine:   Sitting: 122/66  Standing:    SaO2: 91 RA  MODE:  Ambulation: 550 ft   POST:  Rate/Rhythem: 108 ST  BP:  Supine:   Sitting: 140/55  Standing:    SaO2: 94 RA 1025-1100 Assisted X 1 and used walker to ambulate. Gait steady with walker. VS stable. Pt back to recliner after walk with call light in reach. Discussed Outpt. CRP with pt he declines, not interested.  Deon Pilling

## 2012-08-09 ENCOUNTER — Inpatient Hospital Stay (HOSPITAL_COMMUNITY): Payer: Medicare Other

## 2012-08-09 LAB — BASIC METABOLIC PANEL
CO2: 27 mEq/L (ref 19–32)
Glucose, Bld: 117 mg/dL — ABNORMAL HIGH (ref 70–99)
Potassium: 3.3 mEq/L — ABNORMAL LOW (ref 3.5–5.1)
Sodium: 136 mEq/L (ref 135–145)

## 2012-08-09 LAB — COMPREHENSIVE METABOLIC PANEL
Albumin: 3 g/dL — ABNORMAL LOW (ref 3.5–5.2)
BUN: 15 mg/dL (ref 6–23)
Calcium: 9 mg/dL (ref 8.4–10.5)
Creatinine, Ser: 1.27 mg/dL (ref 0.50–1.35)
GFR calc Af Amer: 60 mL/min — ABNORMAL LOW (ref >90)
Glucose, Bld: 118 mg/dL — ABNORMAL HIGH (ref 70–99)
Total Protein: 6.2 g/dL (ref 6.0–8.3)

## 2012-08-09 LAB — GLUCOSE, CAPILLARY
Glucose-Capillary: 130 mg/dL — ABNORMAL HIGH (ref 70–99)
Glucose-Capillary: 107 mg/dL — ABNORMAL HIGH (ref 70–99)

## 2012-08-09 MED ORDER — POTASSIUM CHLORIDE CRYS ER 20 MEQ PO TBCR
40.0000 meq | EXTENDED_RELEASE_TABLET | Freq: Once | ORAL | Status: AC
  Administered 2012-08-09: 40 meq via ORAL

## 2012-08-09 NOTE — Progress Notes (Signed)
CARDIAC REHAB PHASE I   PRE:  Rate/Rhythm: 79 SR  BP:  Supine: 116/60  Sitting:   Standing:    SaO2: 94 RA  MODE:  Ambulation: 550 ft   POST:  Rate/Rhythem: 96 SR  BP:  Supine:   Sitting: 127/61  Standing:     SaO2: 96 RA 1015-1100 Assisted X 1 and used walker to ambulate. Gait steady with walker. VS stable. Pt very reluctant to walk, just" wants to rest and not walk today" Able to convince pt to walk. After walk pt refused to go to recliner and went back to bed. Completed discharge education with pt. Discussed smoking cessation with pt and gave him tips for quitting and coaching contact numbers. Put recovering from open heart surgery video for pt to watch.  Deon Pilling

## 2012-08-09 NOTE — Progress Notes (Addendum)
[address]            [name] [address]          [phone]     4 Days Post-Op  Procedure(s) (LRB): CORONARY ARTERY BYPASS GRAFTING (CABG) (N/A) Subjective: Feels well  Objective  Telemetry sinus rhythm  Temp:  [97.5 F (36.4 C)-98.1 F (36.7 C)] 97.5 F (36.4 C) (09/14 [id number]) Pulse Rate:  [77-90] 77  (09/14 0614) Resp:  [18-19] 18  (09/14 0614) BP: (107-131)/(61-63) 107/62 mmHg (09/14 0614) SpO2:  [93 %-95 %] 93 % (09/14 0614) Weight:  [160 lb 3.2 oz (72.666 kg)] 160 lb 3.2 oz (72.666 kg) (09/14 0251)   Intake/Output Summary (Last 24 hours) at 08/09/12 0913 Last data filed at 08/08/12 1812  Gross per 24 hour  Intake      0 ml  Output      1 ml  Net     -1 ml       General appearance: alert, cooperative and no distress Heart: regular rate and rhythm and S1, S2 normal Lungs: clear to auscultation bilaterally Abdomen: benign Extremities: mild edema L>R LE Wound: incis healing well  Lab Results:  Basename 08/09/12 0627 08/08/12 0515 08/06/12 1700  NA 136 137 --  K 3.3* 3.4* --  CL 98 102 --  CO2 27 24 --  GLUCOSE 117* 118* --  BUN 15 16 --  CREATININE 1.27 1.38* --  CALCIUM 8.7 8.7 --  MG -- -- 1.9  PHOS -- -- --   No results found for this basename: AST:2,ALT:2,ALKPHOS:2,BILITOT:2,PROT:2,ALBUMIN:2 in the last 72 hours No results found for this basename: LIPASE:2,AMYLASE:2 in the last 72 hours  Basename 08/08/12 0515 08/07/12 0410  WBC 9.1 9.8  NEUTROABS -- --  HGB 8.4* 7.5*  HCT 25.2* 22.6*  MCV 89.4 89.7  PLT 130* 98*   No results found for this basename: CKTOTAL:4,CKMB:4,TROPONINI:4 in the last 72 hours No components found with this basename: POCBNP:3 No results found for this basename: DDIMER in the last 72 hours No results found for this basename: HGBA1C in the last 72 hours No results found for this basename: CHOL,HDL,LDLCALC,TRIG,CHOLHDL in the last 72 hours No results found for this basename:  TSH,T4TOTAL,FREET3,T3FREE,THYROIDAB in the last 72 hours No results found for this basename: VITAMINB12,FOLATE,FERRITIN,TIBC,IRON,RETICCTPCT in the last 72 hours  Medications: Scheduled    . aspirin EC  81 mg Oral Daily  . atorvastatin  20 mg Oral q1800  . docusate sodium  200 mg Oral Daily  . feeding supplement  1 Container Oral TID WC  . ferrous sulfate  325 mg Oral BID WC  . folic acid  1 mg Oral Daily  . furosemide  40 mg Oral Daily  . insulin aspart  0-24 Units Subcutaneous TID AC & HS  . lactulose  20 g Oral Once  . metoprolol tartrate  12.5 mg Oral BID  . mupirocin ointment   Nasal BID  . pantoprazole  40 mg Oral QAC breakfast  . potassium chloride  20 mEq Oral Daily  . potassium chloride  20 mEq Oral Once  . potassium chloride  40 mEq Oral Once  . sodium chloride  3 mL Intravenous Q12H  . DISCONTD: potassium chloride  10 mEq Oral Daily     Radiology/Studies:  Dg Chest 2 View  08/09/2012  *RADIOLOGY REPORT*  Clinical Data: 78 year old male status post CABG.  CHEST - 2 VIEW  Comparison: 08/08/2012 and earlier.  Findings: Epicardial pacer wires remain,, other lines and tubes are removed.  Small bilateral pleural effusions.  No pneumothorax or edema.  Stable cardiac size and mediastinal contours.  Sequelae of CABG.  No areas of worsening ventilation.  IMPRESSION: Small bilateral pleural effusions.  No other acute cardiopulmonary abnormality.   Original Report Authenticated By: Randall An, M.D.    Dg Chest 2 View  08/08/2012  *RADIOLOGY REPORT*  Clinical Data: Post chest tube removal  CHEST - 2 VIEW  Comparison: 08/07/2012; 08/06/2012; 08/05/2012; 08/04/2012  Findings: Grossly unchanged cardiac silhouette mediastinal contours post median sternotomy and CABG. Epicardial electrical wires persist, otherwise, interval removal of support apparatus. Interval removal of support apparatus.  No pneumothorax.  Grossly unchanged small bilateral effusions and bibasilar heterogeneous  opacities.  No new focal airspace opacities.  Peculiar crescentic shaped lucency is seen both on the PA and lateral radiograph.  Unchanged bones including chronic deformity of the posterior lateral aspect of the left fifth rib.  IMPRESSION: 1.  Interval removal of support apparatus.  No definite pneumothorax.  2.  Small bilateral effusions and bibasilar opacities, favored to represent atelectasis. 3.  Peculiar apparent right-sided infradiaphragmatic lucency may represent aerated lung in the background of atelectasis, though pneumoperitoneum may have a similar appearance.  Clinical correlation is advised.  Further evaluation with dedicated abdominal radiographic series may be performed as clinically indicated.  Above findings discussed with Leory Plowman, RN at [phone].   Original Report Authenticated By: Rachel Moulds, M.D.     INR: Will add last result for INR, ABG once components are confirmed Will add last 4 CBG results once components are confirmed  Assessment/Plan: S/P Procedure(s) (LRB): CORONARY ARTERY BYPASS GRAFTING (CABG) (N/A)  1. conts to do well 2 creat conts to improve, replace K+  3 small effus /vol overload, cont gentle diuresis 4 cont pulm toilet/rehab 5 cbg's well controlled 6 poss home in am    LOS: 4 days    GOLD,Marc [name] 9/14/20139:13 AM  Patient seen and examined. Agree with above.

## 2012-08-10 LAB — GLUCOSE, CAPILLARY: Glucose-Capillary: 108 mg/dL — ABNORMAL HIGH (ref 70–99)

## 2012-08-10 NOTE — Progress Notes (Signed)
[address]            [place] [address]          [phone]     5 Days Post-Op  Procedure(s) (LRB): CORONARY ARTERY BYPASS GRAFTING (CABG) (N/A) Subjective: conts to do well  Objective  Telemetry sinus rhythm  Temp:  [98.2 F (36.8 C)-98.9 F (37.2 C)] 98.2 F (36.8 C) (09/15 0537) Pulse Rate:  [77-91] 91  (09/15 0537) Resp:  [18] 18  (09/15 0537) BP: (117-141)/(63-66) 141/66 mmHg (09/15 0537) SpO2:  [94 %-95 %] 95 % (09/15 0537) Weight:  [160 lb (72.576 kg)] 160 lb (72.576 kg) (09/15 0537)   Intake/Output Summary (Last 24 hours) at 08/10/12 0814 Last data filed at 08/10/12 0500  Gross per 24 hour  Intake      0 ml  Output      2 ml  Net     -2 ml       General appearance: alert, cooperative and no distress Heart: regular rate and rhythm and S1, S2 normal Lungs: clear to auscultation bilaterally Abdomen: benign Extremities: min edema Wound: incisions healing well  Lab Results:  Basename 08/09/12 1541 08/09/12 0627  NA 135 136  K 4.0 3.3*  CL 99 98  CO2 26 27  GLUCOSE 118* 117*  BUN 15 15  CREATININE 1.27 1.27  CALCIUM 9.0 8.7  MG -- --  PHOS -- --    Basename 08/09/12 1541  AST 15  ALT 9  ALKPHOS 70  BILITOT 0.4  PROT 6.2  ALBUMIN 3.0*   No results found for this basename: LIPASE:2,AMYLASE:2 in the last 72 hours  Basename 08/08/12 0515  WBC 9.1  NEUTROABS --  HGB 8.4*  HCT 25.2*  MCV 89.4  PLT 130*   No results found for this basename: CKTOTAL:4,CKMB:4,TROPONINI:4 in the last 72 hours No components found with this basename: POCBNP:3 No results found for this basename: DDIMER in the last 72 hours No results found for this basename: HGBA1C in the last 72 hours No results found for this basename: CHOL,HDL,LDLCALC,TRIG,CHOLHDL in the last 72 hours No results found for this basename: TSH,T4TOTAL,FREET3,T3FREE,THYROIDAB in the last 72 hours No results found for this basename:  VITAMINB12,FOLATE,FERRITIN,TIBC,IRON,RETICCTPCT in the last 72 hours  Medications: Scheduled    . aspirin EC  81 mg Oral Daily  . atorvastatin  20 mg Oral q1800  . docusate sodium  200 mg Oral Daily  . feeding supplement  1 Container Oral TID WC  . ferrous sulfate  325 mg Oral BID WC  . folic acid  1 mg Oral Daily  . furosemide  40 mg Oral Daily  . insulin aspart  0-24 Units Subcutaneous TID AC & HS  . metoprolol tartrate  12.5 mg Oral BID  . mupirocin ointment   Nasal BID  . pantoprazole  40 mg Oral QAC breakfast  . potassium chloride  20 mEq Oral Daily  . potassium chloride  40 mEq Oral Once  . sodium chloride  3 mL Intravenous Q12H     Radiology/Studies:  Dg Chest 2 View  08/09/2012  *RADIOLOGY REPORT*  Clinical Data: 78 year old male status post CABG.  CHEST - 2 VIEW  Comparison: 08/08/2012 and earlier.  Findings: Epicardial pacer wires remain,, other lines and tubes are removed.  Small bilateral pleural effusions.  No pneumothorax or edema.  Stable cardiac size and mediastinal contours.  Sequelae of CABG.  No areas of worsening ventilation.  IMPRESSION: Small bilateral pleural effusions.  No other acute cardiopulmonary abnormality.   Original Report Authenticated By: Randall An, M.D.     INR: Will add last result for INR, ABG once components are confirmed Will add last 4 CBG results once components are confirmed  Assessment/Plan: S/P Procedure(s) (LRB): CORONARY ARTERY BYPASS GRAFTING (CABG) (N/A) Plan for discharge: see discharge orders   LOS: 5 days    Krystelle Prashad E 9/15/20138:14 AM

## 2012-08-10 NOTE — Progress Notes (Signed)
Discharged to home with family office visits in place teaching done  

## 2012-08-11 MED FILL — Heparin Sodium (Porcine) Inj 1000 Unit/ML: INTRAMUSCULAR | Qty: 10 | Status: AC

## 2012-08-11 MED FILL — Lidocaine HCl IV Inj 20 MG/ML: INTRAVENOUS | Qty: 5 | Status: AC

## 2012-08-11 MED FILL — Sodium Chloride Irrigation Soln 0.9%: Qty: 3000 | Status: AC

## 2012-08-11 MED FILL — Electrolyte-R (PH 7.4) Solution: INTRAVENOUS | Qty: 5000 | Status: AC

## 2012-08-11 MED FILL — Mannitol IV Soln 20%: INTRAVENOUS | Qty: 500 | Status: AC

## 2012-08-11 MED FILL — Sodium Chloride IV Soln 0.9%: INTRAVENOUS | Qty: 1000 | Status: AC

## 2012-08-11 MED FILL — Sodium Bicarbonate IV Soln 8.4%: INTRAVENOUS | Qty: 50 | Status: AC

## 2012-08-11 MED FILL — Albumin, Human Inj 5%: INTRAVENOUS | Qty: 250 | Status: AC

## 2012-08-11 MED FILL — Heparin Sodium (Porcine) Inj 1000 Unit/ML: INTRAMUSCULAR | Qty: 30 | Status: AC

## 2012-08-22 ENCOUNTER — Other Ambulatory Visit: Payer: Self-pay | Admitting: Physician Assistant

## 2012-09-02 ENCOUNTER — Other Ambulatory Visit: Payer: Self-pay

## 2012-09-02 DIAGNOSIS — G8918 Other acute postprocedural pain: Secondary | ICD-10-CM

## 2012-09-02 MED ORDER — TRAMADOL HCL 50 MG PO TABS: 50.0000 mg | ORAL_TABLET | Freq: Four times a day (QID) | ORAL | Status: DC | PRN

## 2012-09-02 NOTE — Telephone Encounter (Signed)
Tramadol 50 mg #40/ no refill, RX faxed to pharm via epic

## 2012-09-03 ENCOUNTER — Emergency Department (HOSPITAL_COMMUNITY)
Admission: EM | Admit: 2012-09-03 | Discharge: 2012-09-03 | Disposition: A | Discharge status: Home / Self Care | Payer: Medicare Other | Attending: Emergency Medicine | Admitting: Emergency Medicine

## 2012-09-03 ENCOUNTER — Other Ambulatory Visit: Payer: Self-pay | Admitting: Cardiothoracic Surgery

## 2012-09-03 ENCOUNTER — Emergency Department (HOSPITAL_COMMUNITY): Payer: Medicare Other

## 2012-09-03 ENCOUNTER — Encounter (HOSPITAL_COMMUNITY): Payer: Self-pay | Admitting: Emergency Medicine

## 2012-09-03 DIAGNOSIS — I10 Essential (primary) hypertension: Secondary | ICD-10-CM | POA: Insufficient documentation

## 2012-09-03 DIAGNOSIS — I714 Abdominal aortic aneurysm, without rupture, unspecified: Secondary | ICD-10-CM | POA: Insufficient documentation

## 2012-09-03 DIAGNOSIS — R197 Diarrhea, unspecified: Secondary | ICD-10-CM | POA: Insufficient documentation

## 2012-09-03 DIAGNOSIS — Z79899 Other long term (current) drug therapy: Secondary | ICD-10-CM | POA: Insufficient documentation

## 2012-09-03 DIAGNOSIS — Z951 Presence of aortocoronary bypass graft: Secondary | ICD-10-CM

## 2012-09-03 DIAGNOSIS — R109 Unspecified abdominal pain: Secondary | ICD-10-CM | POA: Insufficient documentation

## 2012-09-03 DIAGNOSIS — R079 Chest pain, unspecified: Secondary | ICD-10-CM | POA: Insufficient documentation

## 2012-09-03 DIAGNOSIS — I25810 Atherosclerosis of coronary artery bypass graft(s) without angina pectoris: Secondary | ICD-10-CM | POA: Insufficient documentation

## 2012-09-03 DIAGNOSIS — N200 Calculus of kidney: Secondary | ICD-10-CM

## 2012-09-03 DIAGNOSIS — N201 Calculus of ureter: Secondary | ICD-10-CM | POA: Insufficient documentation

## 2012-09-03 LAB — URINE MICROSCOPIC-ADD ON

## 2012-09-03 LAB — CBC
Hemoglobin: 10.9 g/dL — ABNORMAL LOW (ref 13.0–17.0)
Platelets: 211 10*3/uL (ref 150–400)
RBC: 3.83 MIL/uL — ABNORMAL LOW (ref 4.22–5.81)

## 2012-09-03 LAB — URINALYSIS, ROUTINE W REFLEX MICROSCOPIC
Bilirubin Urine: NEGATIVE
Glucose, UA: NEGATIVE mg/dL
Ketones, ur: NEGATIVE mg/dL
Specific Gravity, Urine: 1.015 (ref 1.005–1.030)
pH: 5.5 (ref 5.0–8.0)

## 2012-09-03 LAB — TYPE AND SCREEN: Antibody Screen: NEGATIVE

## 2012-09-03 LAB — PROTIME-INR: INR: 1.08 (ref 0.00–1.49)

## 2012-09-03 LAB — BASIC METABOLIC PANEL
BUN: 22 mg/dL (ref 6–23)
Calcium: 9.3 mg/dL (ref 8.4–10.5)
GFR calc Af Amer: 44 mL/min — ABNORMAL LOW (ref >90)
GFR calc non Af Amer: 38 mL/min — ABNORMAL LOW (ref >90)
Potassium: 3.7 mEq/L (ref 3.5–5.1)

## 2012-09-03 MED ORDER — SODIUM CHLORIDE 0.9 % IV BOLUS (SEPSIS)
500.0000 mL | Freq: Once | INTRAVENOUS | Status: AC
  Administered 2012-09-03: 500 mL via INTRAVENOUS

## 2012-09-03 MED ORDER — OXYCODONE-ACETAMINOPHEN 5-325 MG PO TABS: 1.0000 | ORAL_TABLET | Freq: Four times a day (QID) | ORAL | Status: DC | PRN

## 2012-09-03 MED ORDER — IOHEXOL 350 MG/ML SOLN
80.0000 mL | Freq: Once | INTRAVENOUS | Status: AC | PRN
  Administered 2012-09-03: 80 mL via INTRAVENOUS
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
## 2012-09-03 NOTE — ED Notes (Signed)
To ED from home via EMS, c/o decreased PO, constipation, back pain, known hx of AAA, also has CABG X6 4w ago, no CP/SOB, no N/V, 20g RAC, NAD

## 2012-09-03 NOTE — ED Notes (Signed)
Family states that pt's cardiac surgeon is Dr Horton Marshall and that MDs office reports that Dr will come to see pt in ED

## 2012-09-03 NOTE — ED Notes (Signed)
Dr. Kellie Simmering at the bedside

## 2012-09-04 ENCOUNTER — Telehealth: Payer: Self-pay | Admitting: Vascular Surgery

## 2012-09-04 ENCOUNTER — Ambulatory Visit: Payer: Self-pay | Admitting: Cardiothoracic Surgery

## 2012-09-04 NOTE — Telephone Encounter (Signed)
Message copied by Berniece Salines on Thu Sep 04, 2012  3:53 PM ------      Message from: Denman George      Created: Thu Sep 04, 2012 12:49 PM                   ----- Message -----         From: Mal Misty, MD         Sent: 09/03/2012   7:24 PM           To: Patrici Ranks, Alfonso Patten, RN            09/03/2012      Level IV consult seen in the emergency department-established patient of Dr. early with known abdominal aortic aneurysm-back pain-rule out rupture             Patient needs to followup with Dr. early in the office in 2-3 weeks

## 2012-09-05 ENCOUNTER — Ambulatory Visit (INDEPENDENT_AMBULATORY_CARE_PROVIDER_SITE_OTHER): Payer: Self-pay | Admitting: Cardiothoracic Surgery

## 2012-09-05 ENCOUNTER — Ambulatory Visit
Admission: RE | Admit: 2012-09-05 | Discharge: 2012-09-05 | Disposition: A | Discharge status: Home / Self Care | Payer: Medicare Other | Source: Ambulatory Visit | Attending: Cardiothoracic Surgery | Admitting: Cardiothoracic Surgery

## 2012-09-05 ENCOUNTER — Encounter: Payer: Self-pay | Admitting: Cardiothoracic Surgery

## 2012-09-05 VITALS — BP 104/66 | HR 84 | Resp 18 | Ht 67.5 in | Wt 150.0 lb

## 2012-09-05 DIAGNOSIS — I2510 Atherosclerotic heart disease of native coronary artery without angina pectoris: Secondary | ICD-10-CM

## 2012-09-05 DIAGNOSIS — Z951 Presence of aortocoronary bypass graft: Secondary | ICD-10-CM

## 2012-09-05 DIAGNOSIS — I714 Abdominal aortic aneurysm, without rupture, unspecified: Secondary | ICD-10-CM

## 2012-09-05 NOTE — Progress Notes (Signed)
[address]            [place] [address]          [phone]       [facility] [place] Medical Record [id number] Date of Birth: 1929/12/06  Nahser, Wonda Cheng, MD Leonard Downing, MD  Chief Complaint:   PostOp Follow Up Visit 08/05/2012  DATE OF DISCHARGE:  OPERATIVE REPORT  PREOPERATIVE DIAGNOSIS: Positive Stress Test, three vessel coronary artery and  abdominal aortic aneurysm.  POSTOP DIAGNOSIS: Positive Stress Test, three vessel coronary artery and abdominal  aortic aneurysm.  SURGICAL PROCEDURE: Coronary artery bypass grafting x6 with the left  internal mammary to the left anterior descending coronary artery,  reverse saphenous vein graft to the diagonal coronary artery, sequential  reverse saphenous vein graft to the first and second obtuse marginal,  sequential reverse saphenous vein graft to the distal right coronary  artery, and the distal circumflex coronary artery. With left thigh and  calf endo vein harvesting.   History of Present Illness:      Patient returns to the office today after coronary artery bypass grafting on September 10 and after being seen in the emergency last night for abdominal pain. With his known abdominal aneurysm The onset of left flank and abdominal pain was of concern his daughter brought him to the emergency room. CT scan last night showed a left renal calculus and no evidence of abdominal aneurysm rupture. He was also seen by the vascular service.  Since discharge she's been gaining strength slowly,  he's had no chest pain or evidence of congestive heart failure.  He continues to smoke free.  History  Smoking status  . Former Smoker -- 1.0 packs/day for 68 years  . Types: Cigarettes  . Quit date: 08/04/2012  Smokeless tobacco  . Never Used  Comment: pt states that he is trying to quit using electronic cigarette       No Known Allergies  Current Outpatient Prescriptions    Medication Sig Dispense Refill  . aspirin EC 81 MG tablet Take 81 mg by mouth daily.      Marland Kitchen atorvastatin (LIPITOR) 20 MG tablet Take 1 tablet (20 mg total) by mouth daily at 6 PM.  30 tablet  1  . ibuprofen (ADVIL,MOTRIN) 200 MG tablet Take 400 mg by mouth every 6 (six) hours as needed. For pain      . metoprolol tartrate (LOPRESSOR) 25 MG tablet Take 0.5 tablets (12.5 mg total) by mouth 2 (two) times daily.  30 tablet  1  . Multiple Vitamin (MULTIVITAMIN WITH MINERALS) TABS Take 1 tablet by mouth daily.      Marland Kitchen oxyCODONE-acetaminophen (PERCOCET/ROXICET) 5-325 MG per tablet Take 1 tablet by mouth every 6 (six) hours as needed for pain.  20 tablet  0  . ranitidine (ZANTAC) 150 MG capsule Take 150 mg by mouth 2 (two) times daily as needed. For acid reflux      . traMADol (ULTRAM) 50 MG tablet Take 1 tablet (50 mg total) by mouth every 6 (six) hours as needed for pain.  40 tablet  0       Physical Exam: BP 104/66  Pulse 84  Resp 18  Ht 5' 7.5" (1.715 m)  Wt 150 lb (68.04 kg)  BMI 23.15 kg/m2  SpO2 100%  General appearance: alert, cooperative, appears older than stated age and no  distress Neurologic: intact Heart: regular rate and rhythm, S1, S2 normal, no murmur, click, rub or gallop and normal apical impulse Lungs: clear to auscultation bilaterally and normal percussion bilaterally Abdomen: soft, non-tender; bowel sounds normal; no masses,  no organomegaly, and known abdominal aneurysm has not palpably tender Extremities: extremities normal, atraumatic, no cyanosis or edema, Homans sign is negative, no sign of DVT and  left leg incision is well-healed Wound: Sternum is stable and well healed   Diagnostic Studies & Laboratory data:         Recent Radiology Findings: US Aorta  09/03/2012  *RADIOLOGY REPORT*  Clinical Data: Chest pain.  Aortic aneurysm.  ULTRASOUND AORTA  Comparison:  CT exam 07/08/2012  Findings: Previously, maximal transverse diameter of the infrarenal abdominal  aortic aneurysm was 5.4 x 6.0 cm.  Today, by ultrasound, transverse diameter appears the same at 5.9 x 6.1 cm, allowing for technical differences.  Again, the aneurysm extends to the aortic bifurcation and into the proximal right iliac artery.  There is no ultrasound findings to suggest retroperitoneal hematoma, there is subtle bleeding is not excluded by ultrasound.  IMPRESSION: No change appreciated by ultrasound when compared to the CT study of 2 months ago.  Infrarenal abdominal aortic aneurysm with maximal transverse diameter 5.9 x 6.1 cm, within variation of different technique.  No ultrasound evidence of retroperitoneal bleeding.   Original Report Authenticated By: Jules Schick, M.D.    Dg Abd Acute W/chest  09/03/2012  *RADIOLOGY REPORT*  Clinical Data: Back pain.  Diarrhea.  Recent CABG.  ACUTE ABDOMEN SERIES (ABDOMEN 2 VIEW & CHEST 1 VIEW)  Comparison: 07/08/2012  Findings: Bowel gas pattern is within normal limits without evidence of ileus, obstruction or free air.  There are some colonic air fluid levels that could go along with the history of diarrhea. No significant calcifications.  There is mild curvature degenerative change of the spine.  One-view chest shows normal heart size.  Previous median sternotomy and CABG.  Lungs are clear.  No effusions.  No free air under the diaphragm.  IMPRESSION: Negative radiographs except for some small air-fluid levels in the colon consistent with the clinical history of diarrhea.  No sign of obstruction or free air.   Original Report Authenticated By: Jules Schick, M.D.    Ct Angio Abd/pel W/ And/or W/o  09/03/2012  *RADIOLOGY REPORT*  Clinical Data: Known 6 cm of AAA, recent CABG, presents with left- sided abdominal and flank pain  CT ANGIOGRAPHY ABDOMEN AND PELVIS WITH CONTRAST AND WITHOUT CONTRAST  Comparison: CT abdomen pelvis - 07/08/2012  Vascular Findings:  Abdominal aorta:  No new infrarenal abdominal aortic aneurysm is grossly unchanged in axial  dimension, measuring approximately 5.5 x 5.7 cm (image 102, series 9 but may have minimally increased in greatest oblique coronal dimension measuring 5.4 cm ( coronal image 46)., previously, 5.1 cm. The amount of irregular mural thrombus involving the aneurysm is grossly unchanged.  There is no definite disruption of the wall calcifications.  No definite periaortic stranding.  Celiac artery:  Eccentric plaque involving the origin of the celiac artery does not result in hemodynamically significant narrowing. Conventional branching pattern.  SMA:  A mixture of calcified and noncalcified plaque involving the origin of proximal aspect of the SMA likely approaches 60% luminal narrowing (axial image 54, series nine).  Classical branching pattern.  Right renal artery:  Solitary; mixture of calcified and noncalcified plaque likely approaches 50% luminal narrowing.  Left renal artery:  Solitary; mixture of  calcified and noncalcified plaque is not definitely result in hemodynamically significant stenosis.  Findings:  Is occluded at its origin with retrograde filling via collateral supply to the marginal artery of trauma.  Pelvic vasculature:  There are extensive eccentric calcified and noncalcified atherosclerotic plaque within the bilateral common iliac arteries, not resulting hemodynamically significant stenosis.  Incidental note is made of occluded bilateral superficial femoral arteries bilaterally.  ------------------------------------------------------------------- ---  Nonvascular findings:  Normal hepatic contour.  There is mild diffuse heterogeneous perfusion of the hepatic parenchymal echotexture is favored to be perfusion colon etiology.  No discrete hyperenhancing hepatic lesions.  Normal gallbladder.  No definite intra or extrahepatic biliary ductal dilatation.  No ascites.  There is symmetric enhancement of the bilateral kidneys. There is an approximately 5 mm stone within the distal aspect of the left ureter (at  the level of the UVJ - image 182, series nine) which results in mild upstream ureterectasis and pelvicaliectasis. Grossly unchanged extensive grossly symmetric perinephric stranding. No additional renal stones identified.  No discrete renal lesions.  Normal appearance of the bilateral adrenal glands, pancreas and spleen.  Extensive colonic diverticulosis without evidence of diverticulitis.  The bowel is otherwise normal in course and caliber without wall thickening or evidence of obstruction.  Normal appearance of the appendix.  No pneumoperitoneum, pneumatosis or portal venous gas.  No retroperitoneal, mesenteric, pelvic or inguinal lymphadenopathy.  The prostate is enlarged with minimal mass effect on the undersurface of the bladder.  No free fluid in the pelvis.  Limited visualization of the lower thorax demonstrates an unchanged approximately 5 mm nodule within the left lower lobe (image 19, series nine).  No acute or aggressive osseous abnormalities.  Lumbar spine degenerative change worse at L3 - L4 and L4 - L5.  Bilateral pars defects at L5 are again noted with minimal (approximately 3 mm) of anterolisthesis of L5 upon S1.  IMPRESSION: 1.  Approximately 5 mm stone in the distal aspect of the left ureter/left UVJ results in mild upstream ureterectasis and pelvicaliectasis.  2.  Known infrarenal abdominal aortic aneurysm is unchanged in axial dimension measuring approximately 5.7 cm but may have minimally increased in greatest oblique coronal dimension, now measuring approximately 5.4 cm, previously, 5.1 cm.  The amount of mural thrombus is grossly unchanged.  No periaortic stranding to suggest impending rupture.  3.  Suspected hemodynamically significant narrowings of the SMA and right renal arteries. 4.  Occlusion of the proximal aspects of the bilateral superficial femoral arteries.  5.  Unchanged indeterminate 5-mm nodule within the left lower lobe. Continued attention on follow-up is recommended.  Above  findings discussed with Dr. Kellie Simmering at 18:52.   Original Report Authenticated By: Rachel Moulds, M.D.       Recent Labs: Lab Results  Component Value Date   WBC 8.7 09/03/2012   HGB 10.9* 09/03/2012   HCT 33.4* 09/03/2012   PLT 211 09/03/2012   GLUCOSE 112* 09/03/2012   CHOL  Value: 184 (NOTE) ATP III Classification:      < 200        mg/dL        Desirable     200 - 239     mg/dL        Borderline High     >= 240        mg/dL        High  02/23/2011   TRIG 259* 02/23/2011   HDL 34* 02/23/2011   LDLCALC  Value: 98 (NOTE)  Total Cholesterol/HDL Ratio:CHD Risk                       Coronary Heart Disease Risk Table                                       Men       Women         1/2 Average Risk              3.4        3.3             Average Risk              5.0         4.4          2X Average Risk              9.6        7.1          3X Average Risk             23.4       11.0 Use the calculated Patient Ratio above and the CHD Risk table  to determine the patient's CHD Risk. ATP III Classification (LDL):      < 100         mg/dL         Optimal     100 - 129     mg/dL         Near or Above Optimal     130 - 159     mg/dL         Borderline High     160 - 189     mg/dL         High      > 190        mg/dL         Very High  02/23/2011   ALT 9 08/09/2012   AST 15 08/09/2012   NA 138 09/03/2012   K 3.7 09/03/2012   CL 102 09/03/2012   CREATININE 1.63* 09/03/2012   BUN 22 09/03/2012   CO2 25 09/03/2012   INR 1.08 09/03/2012   HGBA1C 5.7* 08/04/2012      Assessment / Plan:     Patient is making steady progress following coronary artery bypass grafting He was found to have a left kidney stone by CT yesterday He denies any difficulty with urination, and is continued to be followed by urology because of urinary retention following bypass surgery. He is to use see Dr. Donnetta Hutching next week to make arrangements to continue with previous plan repair of his abdominal aortic aneurysm Patient's daughter Lavera Guise when he  is admitted for his aneurysm repair and I will see him in the hospital.   Ediel Unangst B 09/05/2012 12:03 PM

## 2012-09-05 NOTE — Patient Instructions (Signed)
Increase walking No lifting over 20 lbs See Dr Donnetta Hutching about aortic repair.

## 2012-09-15 ENCOUNTER — Encounter: Payer: Self-pay | Admitting: Vascular Surgery

## 2012-09-16 ENCOUNTER — Ambulatory Visit (INDEPENDENT_AMBULATORY_CARE_PROVIDER_SITE_OTHER): Payer: Medicare Other | Admitting: Vascular Surgery

## 2012-09-16 ENCOUNTER — Encounter: Payer: Self-pay | Admitting: Vascular Surgery

## 2012-09-16 VITALS — BP 143/75 | HR 87 | Ht 67.5 in | Wt 147.0 lb

## 2012-09-16 DIAGNOSIS — I714 Abdominal aortic aneurysm, without rupture: Secondary | ICD-10-CM

## 2012-09-16 NOTE — Progress Notes (Signed)
Patient has today for further discussion of his abdominal aortic aneurysm. He undergone evaluation where he was having an enlarging aneurysm. He was scheduled for elective repair and preoperative cardiac clearance revealed critical cardiac disease. He successfully underwent coronary bypass grafting by Dr. Servando Snare and has made a good recovery. He did have flank pain 2 weeks ago and was seen in the emergency department with the diagnosis of the renal calculus for his pain. CT scan showed no evidence of rupture at that time with a 6 cm infrarenal abdominal aortic aneurysm.  Past Medical History  Diagnosis Date  . AAA (abdominal aortic aneurysm)   . Hypertension   . Hyperlipidemia   . GERD (gastroesophageal reflux disease)   . Arthritis   . Stroke 2012    affecting RUE  . Coronary artery disease   . History of ulcerative colitis   . History of Clostridium difficile infection   . Gall stones     History  Substance Use Topics  . Smoking status: Former Smoker -- 1.0 packs/day for 68 years    Types: Cigarettes    Quit date: 08/04/2012  . Smokeless tobacco: Never Used  . Alcohol Use: No    Family History  Problem Relation Age of Onset  . Stroke Mother     No Known Allergies  Current outpatient prescriptions:aspirin EC 81 MG tablet, Take 81 mg by mouth daily., Disp: , Rfl: ;  atorvastatin (LIPITOR) 20 MG tablet, Take 1 tablet (20 mg total) by mouth daily at 6 PM., Disp: 30 tablet, Rfl: 1;  ibuprofen (ADVIL,MOTRIN) 200 MG tablet, Take 400 mg by mouth every 6 (six) hours as needed. For pain, Disp: , Rfl:  metoprolol tartrate (LOPRESSOR) 25 MG tablet, Take 0.5 tablets (12.5 mg total) by mouth 2 (two) times daily., Disp: 30 tablet, Rfl: 1;  Multiple Vitamin (MULTIVITAMIN WITH MINERALS) TABS, Take 1 tablet by mouth daily., Disp: , Rfl: ;  oxyCODONE-acetaminophen (PERCOCET/ROXICET) 5-325 MG per tablet, Take 1 tablet by mouth every 6 (six) hours as needed for pain., Disp: 20 tablet, Rfl:  0 ranitidine (ZANTAC) 150 MG capsule, Take 150 mg by mouth 2 (two) times daily as needed. For acid reflux, Disp: , Rfl: ;  traMADol (ULTRAM) 50 MG tablet, Take 1 tablet (50 mg total) by mouth every 6 (six) hours as needed for pain., Disp: 40 tablet, Rfl: 0  BP 143/75  Pulse 87  Ht 5' 7.5" (1.715 m)  Wt 147 lb (66.679 kg)  BMI 22.68 kg/m2  SpO2 100%  Body mass index is 22.68 kg/(m^2).       Physical exam: Well-developed well-nourished white male no acute distress Heart regular rate and rhythm Chest clear bilaterally Abdomen easily palpable aneurysm which is nontender 2+ femoral pulses and absent pedal pulses.  Impression and plan: 6 cm infrarenal abdominal aortic aneurysm. Have recommended elective repair of the stent graft. We will re\re size is aneurysm with his most recent CT scan from 2 weeks ago and schedule his procedure at his convenience. In all likelihood he will have a percutaneous access versus a small incisional cutdown in his groin. I explained that it is one night hospitalization assuming no perioperative events.

## 2012-09-17 ENCOUNTER — Encounter: Payer: Self-pay | Admitting: *Deleted

## 2012-09-17 ENCOUNTER — Encounter (HOSPITAL_COMMUNITY): Payer: Self-pay | Admitting: Pharmacy Technician

## 2012-09-17 ENCOUNTER — Other Ambulatory Visit: Payer: Self-pay | Admitting: *Deleted

## 2012-09-23 ENCOUNTER — Encounter (HOSPITAL_COMMUNITY)
Admission: RE | Admit: 2012-09-23 | Discharge: 2012-09-23 | Disposition: A | Discharge status: Home / Self Care | Payer: Medicare Other | Source: Ambulatory Visit | Attending: Vascular Surgery | Admitting: Vascular Surgery

## 2012-09-23 ENCOUNTER — Encounter (HOSPITAL_COMMUNITY): Payer: Self-pay

## 2012-09-23 HISTORY — DX: Calculus of kidney: N20.0

## 2012-09-23 LAB — COMPREHENSIVE METABOLIC PANEL
Albumin: 3.8 g/dL (ref 3.5–5.2)
Alkaline Phosphatase: 104 U/L (ref 39–117)
BUN: 23 mg/dL (ref 6–23)
CO2: 22 mEq/L (ref 19–32)
Chloride: 104 mEq/L (ref 96–112)
Creatinine, Ser: 1.27 mg/dL (ref 0.50–1.35)
GFR calc non Af Amer: 52 mL/min — ABNORMAL LOW (ref >90)
Glucose, Bld: 113 mg/dL — ABNORMAL HIGH (ref 70–99)
Potassium: 4.3 mEq/L (ref 3.5–5.1)
Total Bilirubin: 0.2 mg/dL — ABNORMAL LOW (ref 0.3–1.2)

## 2012-09-23 LAB — BLOOD GAS, ARTERIAL
Acid-base deficit: 1.3 mmol/L (ref 0.0–2.0)
Bicarbonate: 22.6 mEq/L (ref 20.0–24.0)
TCO2: 23.7 mmol/L (ref 0–100)
pCO2 arterial: 36 mmHg (ref 35.0–45.0)
pH, Arterial: 7.414 (ref 7.350–7.450)
pO2, Arterial: 92 mmHg (ref 80.0–100.0)

## 2012-09-23 LAB — URINALYSIS, ROUTINE W REFLEX MICROSCOPIC
Bilirubin Urine: NEGATIVE
Glucose, UA: NEGATIVE mg/dL
Hgb urine dipstick: NEGATIVE
Protein, ur: NEGATIVE mg/dL

## 2012-09-23 LAB — CBC
HCT: 37.3 % — ABNORMAL LOW (ref 39.0–52.0)
Hemoglobin: 12.2 g/dL — ABNORMAL LOW (ref 13.0–17.0)
MCV: 86.9 fL (ref 78.0–100.0)
RBC: 4.29 MIL/uL (ref 4.22–5.81)
RDW: 14.7 % (ref 11.5–15.5)
WBC: 8.7 10*3/uL (ref 4.0–10.5)

## 2012-09-23 LAB — PROTIME-INR
INR: 1.01 (ref 0.00–1.49)
Prothrombin Time: 13.2 seconds (ref 11.6–15.2)

## 2012-09-23 LAB — SURGICAL PCR SCREEN: Staphylococcus aureus: NEGATIVE

## 2012-09-23 LAB — TYPE AND SCREEN: ABO/RH(D): O POS

## 2012-09-23 NOTE — Progress Notes (Signed)
Verified with Bethena Roys in Dr. Luther Parody office that pt does not need to stop ASA prior to surgery.

## 2012-09-23 NOTE — Progress Notes (Signed)
Left chart for anesthesia review. Pt s/p CABG, now having AAA stent repair.

## 2012-09-23 NOTE — Pre-Procedure Instructions (Signed)
Hope  09/23/2012   Your procedure is scheduled on:  Monday November 4  Report to [facility] at 5"30 AM.  Call this number if you have problems the morning of surgery: [phone]   Remember:   Do not eat or drink:After Midnight.    Take these medicines the morning of surgery with A SIP OF WATER: Metoprolol. May take oxycodone, tramadol, ranitidine if needed.   Do not wear jewelry, make-up or nail polish.  Do not wear lotions, powders, or perfumes. You may wear deodorant.  Do not shave 48 hours prior to surgery. Men may shave face and neck.  Do not bring valuables to the hospital.  Contacts, dentures or bridgework may not be worn into surgery.  Leave suitcase in the car. After surgery it may be brought to your room.  For patients admitted to the hospital, checkout time is 11:00 AM the day of discharge.   Patients discharged the day of surgery will not be allowed to drive home.  Name and phone number of your driver: NA  Special Instructions: Shower using CHG 2 nights before surgery and the night before surgery.  If you shower the day of surgery use CHG.  Use special wash - you have one bottle of CHG for all showers.  You should use approximately 1/3 of the bottle for each shower.   Please read over the following fact sheets that you were given: Pain Booklet, Coughing and Deep Breathing, Blood Transfusion Information and Surgical Site Infection Prevention

## 2012-09-24 NOTE — Consult Note (Signed)
Anesthesia Chart Review:  Patient is a 78 year old male scheduled for EVAR of AAA on 09/29/12 by Dr. Donnetta Hutching.  He was evaluated for back pain in the ED on 09/03/12.  There was no change in his 5.7 cm AAA and pain was thought most likely do to a left kidney stone.  Other history includes CAD s/p CABG X 6 (LIMA to LAD, SVG to DIAG, sequential SVG to OM1/2, sequential SVG to distal RCA and distal CX) on 08/05/12, HTN, HLD, GERD, arthritis, CVA '12, ulcerative colitits, recent former smoker (has been using e-cigarette).  PCP is Dr. Leonard Downing.  He actually saw Cardiologist Dr. Acie Fredrickson for a pre-operative evaluation for AAA repair in August of this year and had a abnormal stress test on 07/17/12 that showed inferolateral ischemia, EF 66%.  Cardiac cath was done on 07/25/12 that showed severe 3V CAD and well preserved LV function.  Dr. Acie Fredrickson recommended TCTS consultation, and Dr. Servando Snare recommended proceeding with CABG prior to AAA repair. I do not see that he has had a post-operative cardiology visit, just follow-up with CT surgery.  Dr. Servando Snare saw him for a post-operative visit on 09/05/12 and is aware of need for AAA repair in the near future and plans to see him during his hospitalization.    EKG on 09/03/12 showed NSR, borderline low voltage QRS.  CXR on 09/05/12 showed no acute findings.   Labs noted.  I reviewed above with Anesthesiologist Dr. Conrad Andrews.  Since patient has undergone coronary revascularization and has been evaluated by Dr. Servando Snare, who is aware of plans for AAA repair, then would plan to proceed from an anesthesia standpoint if no significant change in his CV status.   Myra Gianotti, PA-C

## 2012-09-28 MED ORDER — DEXTROSE 5 % IV SOLN
1.5000 g | INTRAVENOUS | Status: AC
  Administered 2012-09-29: 1.5 g via INTRAVENOUS
  Filled 2012-09-28: qty 1.5

## 2012-09-29 ENCOUNTER — Encounter (HOSPITAL_COMMUNITY): Payer: Self-pay | Admitting: Vascular Surgery

## 2012-09-29 ENCOUNTER — Encounter (HOSPITAL_COMMUNITY): Payer: Self-pay | Admitting: *Deleted

## 2012-09-29 ENCOUNTER — Inpatient Hospital Stay (HOSPITAL_COMMUNITY)
Admission: RE | Admit: 2012-09-29 | Discharge: 2012-09-30 | DRG: 238 | Disposition: A | Discharge status: Home / Self Care | Payer: Medicare Other | Source: Ambulatory Visit | Attending: Vascular Surgery | Admitting: Vascular Surgery

## 2012-09-29 ENCOUNTER — Encounter (HOSPITAL_COMMUNITY)
Admission: RE | Disposition: A | Discharge status: Home / Self Care | Payer: Self-pay | Source: Ambulatory Visit | Attending: Vascular Surgery

## 2012-09-29 ENCOUNTER — Inpatient Hospital Stay (HOSPITAL_COMMUNITY): Payer: Medicare Other

## 2012-09-29 ENCOUNTER — Encounter (HOSPITAL_COMMUNITY): Payer: Self-pay | Admitting: Anesthesiology

## 2012-09-29 ENCOUNTER — Inpatient Hospital Stay (HOSPITAL_COMMUNITY): Payer: Medicare Other | Admitting: Vascular Surgery

## 2012-09-29 DIAGNOSIS — Z951 Presence of aortocoronary bypass graft: Secondary | ICD-10-CM

## 2012-09-29 DIAGNOSIS — I69998 Other sequelae following unspecified cerebrovascular disease: Secondary | ICD-10-CM

## 2012-09-29 DIAGNOSIS — Z79899 Other long term (current) drug therapy: Secondary | ICD-10-CM

## 2012-09-29 DIAGNOSIS — I743 Embolism and thrombosis of arteries of the lower extremities: Secondary | ICD-10-CM

## 2012-09-29 DIAGNOSIS — G8918 Other acute postprocedural pain: Secondary | ICD-10-CM

## 2012-09-29 DIAGNOSIS — Z23 Encounter for immunization: Secondary | ICD-10-CM

## 2012-09-29 DIAGNOSIS — Z87891 Personal history of nicotine dependence: Secondary | ICD-10-CM

## 2012-09-29 DIAGNOSIS — I714 Abdominal aortic aneurysm, without rupture, unspecified: Principal | ICD-10-CM | POA: Diagnosis present

## 2012-09-29 DIAGNOSIS — Z7982 Long term (current) use of aspirin: Secondary | ICD-10-CM

## 2012-09-29 DIAGNOSIS — E785 Hyperlipidemia, unspecified: Secondary | ICD-10-CM | POA: Diagnosis present

## 2012-09-29 DIAGNOSIS — Z01812 Encounter for preprocedural laboratory examination: Secondary | ICD-10-CM

## 2012-09-29 DIAGNOSIS — M129 Arthropathy, unspecified: Secondary | ICD-10-CM | POA: Diagnosis present

## 2012-09-29 DIAGNOSIS — I10 Essential (primary) hypertension: Secondary | ICD-10-CM | POA: Diagnosis present

## 2012-09-29 DIAGNOSIS — I70209 Unspecified atherosclerosis of native arteries of extremities, unspecified extremity: Secondary | ICD-10-CM | POA: Diagnosis present

## 2012-09-29 DIAGNOSIS — I2510 Atherosclerotic heart disease of native coronary artery without angina pectoris: Secondary | ICD-10-CM | POA: Diagnosis present

## 2012-09-29 DIAGNOSIS — K219 Gastro-esophageal reflux disease without esophagitis: Secondary | ICD-10-CM | POA: Diagnosis present

## 2012-09-29 HISTORY — PX: FEMORAL ARTERY EXPLORATION: SHX5160

## 2012-09-29 HISTORY — PX: ENDARTERECTOMY FEMORAL: SHX5804

## 2012-09-29 HISTORY — PX: PATCH ANGIOPLASTY: SHX6230

## 2012-09-29 HISTORY — PX: ABDOMINAL AORTIC ENDOVASCULAR STENT GRAFT: SHX5707

## 2012-09-29 LAB — BASIC METABOLIC PANEL WITH GFR
BUN: 22 mg/dL (ref 6–23)
CO2: 24 meq/L (ref 19–32)
Calcium: 8.4 mg/dL (ref 8.4–10.5)
Chloride: 105 meq/L (ref 96–112)
Creatinine, Ser: 1.31 mg/dL (ref 0.50–1.35)
GFR calc Af Amer: 58 mL/min — ABNORMAL LOW
GFR calc non Af Amer: 50 mL/min — ABNORMAL LOW
Glucose, Bld: 122 mg/dL — ABNORMAL HIGH (ref 70–99)
Potassium: 3.8 meq/L (ref 3.5–5.1)
Sodium: 139 meq/L (ref 135–145)

## 2012-09-29 LAB — CBC
HCT: 28.8 % — ABNORMAL LOW (ref 39.0–52.0)
Hemoglobin: 9.4 g/dL — ABNORMAL LOW (ref 13.0–17.0)
MCH: 28.4 pg (ref 26.0–34.0)
MCHC: 32.6 g/dL (ref 30.0–36.0)
MCV: 87 fL (ref 78.0–100.0)

## 2012-09-29 LAB — APTT: aPTT: 42 s — ABNORMAL HIGH (ref 24–37)

## 2012-09-29 SURGERY — INSERTION, ENDOVASCULAR STENT GRAFT, AORTA, ABDOMINAL
Anesthesia: General | Wound class: Clean

## 2012-09-29 SURGERY — EXPLORATION, ARTERY, FEMORAL
Anesthesia: General | Site: Groin | Laterality: Right | Wound class: Clean

## 2012-09-29 MED ORDER — MORPHINE SULFATE 2 MG/ML IJ SOLN: 2.0000 mg | INTRAMUSCULAR | Status: DC | PRN

## 2012-09-29 MED ORDER — LACTATED RINGERS IV SOLN: INTRAVENOUS | Status: DC | PRN

## 2012-09-29 MED ORDER — CEFAZOLIN SODIUM 1-5 GM-% IV SOLN: INTRAVENOUS | Status: DC | PRN

## 2012-09-29 MED ORDER — OXYCODONE HCL 5 MG/5ML PO SOLN: 5.0000 mg | Freq: Once | ORAL | Status: DC | PRN

## 2012-09-29 MED ORDER — DEXTROSE 5 % IV SOLN
1.5000 g | Freq: Two times a day (BID) | INTRAVENOUS | Status: AC
  Administered 2012-09-29 – 2012-09-30 (×2): 1.5 g via INTRAVENOUS
  Filled 2012-09-29 (×2): qty 1.5

## 2012-09-29 MED ORDER — 0.9 % SODIUM CHLORIDE (POUR BTL) OPTIME
TOPICAL | Status: DC | PRN
  Administered 2012-09-29: 1000 mL

## 2012-09-29 MED ORDER — DOCUSATE SODIUM 100 MG PO CAPS
100.0000 mg | ORAL_CAPSULE | Freq: Every day | ORAL | Status: DC
  Filled 2012-09-29: qty 1

## 2012-09-29 MED ORDER — ONDANSETRON HCL 4 MG/2ML IJ SOLN
INTRAMUSCULAR | Status: DC | PRN
  Administered 2012-09-29: 4 mg via INTRAVENOUS

## 2012-09-29 MED ORDER — SODIUM CHLORIDE 0.9 % IV SOLN: 500.0000 mL | Freq: Once | INTRAVENOUS | Status: AC | PRN

## 2012-09-29 MED ORDER — ONDANSETRON HCL 4 MG/2ML IJ SOLN: 4.0000 mg | Freq: Four times a day (QID) | INTRAMUSCULAR | Status: DC | PRN

## 2012-09-29 MED ORDER — CEFAZOLIN SODIUM 1-5 GM-% IV SOLN
INTRAVENOUS | Status: AC
  Filled 2012-09-29: qty 50

## 2012-09-29 MED ORDER — ATORVASTATIN CALCIUM 20 MG PO TABS
20.0000 mg | ORAL_TABLET | Freq: Every day | ORAL | Status: DC
  Administered 2012-09-29: 20 mg via ORAL
  Filled 2012-09-29 (×3): qty 1

## 2012-09-29 MED ORDER — PROPOFOL 10 MG/ML IV BOLUS
INTRAVENOUS | Status: DC | PRN
  Administered 2012-09-29: 100 mg via INTRAVENOUS

## 2012-09-29 MED ORDER — LACTATED RINGERS IV SOLN
INTRAVENOUS | Status: DC | PRN
  Administered 2012-09-29 (×2): via INTRAVENOUS

## 2012-09-29 MED ORDER — FENTANYL CITRATE 0.05 MG/ML IJ SOLN
INTRAMUSCULAR | Status: DC | PRN
  Administered 2012-09-29: 150 ug via INTRAVENOUS

## 2012-09-29 MED ORDER — ASPIRIN EC 81 MG PO TBEC
81.0000 mg | DELAYED_RELEASE_TABLET | Freq: Every day | ORAL | Status: DC
  Administered 2012-09-29 – 2012-09-30 (×2): 81 mg via ORAL
  Filled 2012-09-29 (×2): qty 1

## 2012-09-29 MED ORDER — VECURONIUM BROMIDE 10 MG IV SOLR
INTRAVENOUS | Status: DC | PRN
  Administered 2012-09-29: 1 mg via INTRAVENOUS

## 2012-09-29 MED ORDER — SODIUM CHLORIDE 0.9 % IV SOLN: INTRAVENOUS | Status: DC

## 2012-09-29 MED ORDER — ACETAMINOPHEN 650 MG RE SUPP
325.0000 mg | RECTAL | Status: DC | PRN
Start: 2012-09-29 — End: 2012-09-30

## 2012-09-29 MED ORDER — MAGNESIUM SULFATE 40 MG/ML IJ SOLN
2.0000 g | Freq: Once | INTRAMUSCULAR | Status: AC | PRN
  Administered 2012-09-29: 2 g via INTRAVENOUS
  Filled 2012-09-29 (×2): qty 50

## 2012-09-29 MED ORDER — PROTAMINE SULFATE 10 MG/ML IV SOLN
INTRAVENOUS | Status: DC | PRN
  Administered 2012-09-29 (×5): 10 mg via INTRAVENOUS

## 2012-09-29 MED ORDER — LACTATED RINGERS IV SOLN
INTRAVENOUS | Status: DC | PRN
  Administered 2012-09-29: 07:00:00 via INTRAVENOUS

## 2012-09-29 MED ORDER — GLYCOPYRROLATE 0.2 MG/ML IJ SOLN
INTRAMUSCULAR | Status: DC | PRN
  Administered 2012-09-29: .8 mg via INTRAVENOUS

## 2012-09-29 MED ORDER — PANTOPRAZOLE SODIUM 40 MG PO TBEC
40.0000 mg | DELAYED_RELEASE_TABLET | Freq: Every day | ORAL | Status: DC
  Administered 2012-09-29 – 2012-09-30 (×2): 40 mg via ORAL
  Filled 2012-09-29 (×2): qty 1

## 2012-09-29 MED ORDER — ALUM & MAG HYDROXIDE-SIMETH 200-200-20 MG/5ML PO SUSP: 15.0000 mL | ORAL | Status: DC | PRN

## 2012-09-29 MED ORDER — ACETAMINOPHEN 325 MG PO TABS
325.0000 mg | ORAL_TABLET | ORAL | Status: DC | PRN
  Administered 2012-09-29: 650 mg via ORAL
  Filled 2012-09-29: qty 2

## 2012-09-29 MED ORDER — ESMOLOL HCL 10 MG/ML IV SOLN: INTRAVENOUS | Status: DC | PRN

## 2012-09-29 MED ORDER — INFLUENZA VIRUS VACC SPLIT PF IM SUSP
0.5000 mL | INTRAMUSCULAR | Status: AC
  Administered 2012-09-30: 0.5 mL via INTRAMUSCULAR
  Filled 2012-09-29: qty 0.5

## 2012-09-29 MED ORDER — SODIUM CHLORIDE 0.9 % IR SOLN
Status: DC | PRN
  Administered 2012-09-29: 09:00:00

## 2012-09-29 MED ORDER — FAMOTIDINE 20 MG PO TABS
20.0000 mg | ORAL_TABLET | Freq: Every day | ORAL | Status: DC
  Administered 2012-09-29 – 2012-09-30 (×2): 20 mg via ORAL
  Filled 2012-09-29 (×3): qty 1

## 2012-09-29 MED ORDER — SODIUM CHLORIDE 0.9 % IR SOLN
Status: DC | PRN
  Administered 2012-09-29: 12:00:00

## 2012-09-29 MED ORDER — HYDRALAZINE HCL 20 MG/ML IJ SOLN: 10.0000 mg | INTRAMUSCULAR | Status: DC | PRN

## 2012-09-29 MED ORDER — LABETALOL HCL 5 MG/ML IV SOLN
10.0000 mg | INTRAVENOUS | Status: DC | PRN
  Administered 2012-09-29: 10 mg via INTRAVENOUS
  Filled 2012-09-29: qty 4

## 2012-09-29 MED ORDER — NEOSTIGMINE METHYLSULFATE 1 MG/ML IJ SOLN
INTRAMUSCULAR | Status: DC | PRN
  Administered 2012-09-29: 4 mg via INTRAVENOUS

## 2012-09-29 MED ORDER — OXYCODONE HCL 5 MG PO TABS: 5.0000 mg | ORAL_TABLET | Freq: Once | ORAL | Status: DC | PRN

## 2012-09-29 MED ORDER — LABETALOL HCL 5 MG/ML IV SOLN
INTRAVENOUS | Status: DC | PRN
  Administered 2012-09-29: 5 mg via INTRAVENOUS

## 2012-09-29 MED ORDER — EPHEDRINE SULFATE 50 MG/ML IJ SOLN
INTRAMUSCULAR | Status: DC | PRN
  Administered 2012-09-29 (×3): 10 mg via INTRAVENOUS

## 2012-09-29 MED ORDER — IODIXANOL 320 MG/ML IV SOLN
INTRAVENOUS | Status: DC | PRN
  Administered 2012-09-29: 150 mL via INTRA_ARTERIAL

## 2012-09-29 MED ORDER — PHENOL 1.4 % MT LIQD: 1.0000 | OROMUCOSAL | Status: DC | PRN

## 2012-09-29 MED ORDER — PNEUMOCOCCAL VAC POLYVALENT 25 MCG/0.5ML IJ INJ
0.5000 mL | INJECTION | INTRAMUSCULAR | Status: AC
  Administered 2012-09-30: 0.5 mL via INTRAMUSCULAR
  Filled 2012-09-29: qty 0.5

## 2012-09-29 MED ORDER — ONDANSETRON HCL 4 MG/2ML IJ SOLN: 4.0000 mg | Freq: Once | INTRAMUSCULAR | Status: DC | PRN

## 2012-09-29 MED ORDER — LIDOCAINE HCL (CARDIAC) 20 MG/ML IV SOLN
INTRAVENOUS | Status: DC | PRN
  Administered 2012-09-29: 50 mg via INTRAVENOUS

## 2012-09-29 MED ORDER — ARTIFICIAL TEARS OP OINT
TOPICAL_OINTMENT | OPHTHALMIC | Status: DC | PRN
  Administered 2012-09-29: 1 via OPHTHALMIC

## 2012-09-29 MED ORDER — DOPAMINE-DEXTROSE 3.2-5 MG/ML-% IV SOLN: 3.0000 ug/kg/min | INTRAVENOUS | Status: DC

## 2012-09-29 MED ORDER — HYDROMORPHONE HCL PF 1 MG/ML IJ SOLN: 0.2500 mg | INTRAMUSCULAR | Status: DC | PRN

## 2012-09-29 MED ORDER — GUAIFENESIN-DM 100-10 MG/5ML PO SYRP
15.0000 mL | ORAL_SOLUTION | ORAL | Status: DC | PRN
  Administered 2012-09-30: 15 mL via ORAL
  Filled 2012-09-29: qty 15

## 2012-09-29 MED ORDER — METOPROLOL TARTRATE 12.5 MG HALF TABLET
12.5000 mg | ORAL_TABLET | Freq: Once | ORAL | Status: AC
  Administered 2012-09-29: 12.5 mg via ORAL
  Filled 2012-09-29 (×2): qty 1

## 2012-09-29 MED ORDER — OXYCODONE-ACETAMINOPHEN 5-325 MG PO TABS
1.0000 | ORAL_TABLET | ORAL | Status: DC | PRN
  Administered 2012-09-29 – 2012-09-30 (×2): 2 via ORAL
  Filled 2012-09-29 (×2): qty 2

## 2012-09-29 MED ORDER — POTASSIUM CHLORIDE CRYS ER 20 MEQ PO TBCR
20.0000 meq | EXTENDED_RELEASE_TABLET | Freq: Once | ORAL | Status: AC | PRN
  Administered 2012-09-29: 20 meq via ORAL
  Filled 2012-09-29: qty 1

## 2012-09-29 MED ORDER — MEPERIDINE HCL 25 MG/ML IJ SOLN: 6.2500 mg | INTRAMUSCULAR | Status: DC | PRN

## 2012-09-29 MED ORDER — METOPROLOL TARTRATE 12.5 MG HALF TABLET
12.5000 mg | ORAL_TABLET | Freq: Two times a day (BID) | ORAL | Status: DC
  Administered 2012-09-29 – 2012-09-30 (×2): 12.5 mg via ORAL
  Filled 2012-09-29 (×3): qty 1

## 2012-09-29 MED ORDER — METOPROLOL TARTRATE 1 MG/ML IV SOLN: 2.0000 mg | INTRAVENOUS | Status: DC | PRN

## 2012-09-29 MED ORDER — SODIUM CHLORIDE 0.9 % IV SOLN
INTRAVENOUS | Status: DC
  Administered 2012-09-29: 15:00:00 via INTRAVENOUS

## 2012-09-29 MED ORDER — ROCURONIUM BROMIDE 100 MG/10ML IV SOLN
INTRAVENOUS | Status: DC | PRN
  Administered 2012-09-29: 50 mg via INTRAVENOUS

## 2012-09-29 MED ORDER — ADULT MULTIVITAMIN W/MINERALS CH
1.0000 | ORAL_TABLET | Freq: Every day | ORAL | Status: DC
  Administered 2012-09-30: 1 via ORAL
  Filled 2012-09-29: qty 1

## 2012-09-29 MED ORDER — HEPARIN SODIUM (PORCINE) 1000 UNIT/ML IJ SOLN
INTRAMUSCULAR | Status: DC | PRN
  Administered 2012-09-29: 5000 [IU] via INTRAVENOUS

## 2012-09-29 MED ORDER — 0.9 % SODIUM CHLORIDE (POUR BTL) OPTIME
TOPICAL | Status: DC | PRN
  Administered 2012-09-29 (×2): 1000 mL

## 2012-09-29 SURGICAL SUPPLY — 58 items
BANDAGE ESMARK 6X9 LF (GAUZE/BANDAGES/DRESSINGS) IMPLANT
BENZOIN TINCTURE PRP APPL 2/3 (GAUZE/BANDAGES/DRESSINGS) ×3 IMPLANT
BNDG ESMARK 6X9 LF (GAUZE/BANDAGES/DRESSINGS)
CANISTER SUCTION 2500CC (MISCELLANEOUS) ×3 IMPLANT
CATH EMB 3FR 80CM (CATHETERS) IMPLANT
CATH EMB 4FR 80CM (CATHETERS) ×3 IMPLANT
CATH EMB 5FR 80CM (CATHETERS) IMPLANT
CLIP LIGATING EXTRA MED SLVR (CLIP) ×3 IMPLANT
CLIP LIGATING EXTRA SM BLUE (MISCELLANEOUS) ×3 IMPLANT
CLOTH BEACON ORANGE TIMEOUT ST (SAFETY) ×3 IMPLANT
COVER SURGICAL LIGHT HANDLE (MISCELLANEOUS) ×3 IMPLANT
CUFF TOURNIQUET SINGLE 34IN LL (TOURNIQUET CUFF) IMPLANT
CUFF TOURNIQUET SINGLE 44IN (TOURNIQUET CUFF) IMPLANT
DRAIN SNY 10X20 3/4 PERF (WOUND CARE) IMPLANT
DRAPE WARM FLUID 44X44 (DRAPE) ×3 IMPLANT
DRAPE X-RAY CASS 24X20 (DRAPES) IMPLANT
DRSG COVADERM 4X8 (GAUZE/BANDAGES/DRESSINGS) IMPLANT
ELECT REM PT RETURN 9FT ADLT (ELECTROSURGICAL) ×3
ELECTRODE REM PT RTRN 9FT ADLT (ELECTROSURGICAL) ×2 IMPLANT
EVACUATOR SILICONE 100CC (DRAIN) IMPLANT
GLOVE BIOGEL PI IND STRL 6.5 (GLOVE) ×4 IMPLANT
GLOVE BIOGEL PI IND STRL 7.5 (GLOVE) ×2 IMPLANT
GLOVE BIOGEL PI INDICATOR 6.5 (GLOVE) ×2
GLOVE BIOGEL PI INDICATOR 7.5 (GLOVE) ×1
GLOVE ECLIPSE 7.5 STRL STRAW (GLOVE) ×3 IMPLANT
GLOVE ORTHOPEDIC STR SZ6.5 (GLOVE) ×3 IMPLANT
GLOVE SS BIOGEL STRL SZ 7 (GLOVE) ×2 IMPLANT
GLOVE SS BIOGEL STRL SZ 7.5 (GLOVE) ×2 IMPLANT
GLOVE SUPERSENSE BIOGEL SZ 7 (GLOVE) ×1
GLOVE SUPERSENSE BIOGEL SZ 7.5 (GLOVE) ×1
GOWN PREVENTION PLUS XLARGE (GOWN DISPOSABLE) ×3 IMPLANT
GOWN STRL NON-REIN LRG LVL3 (GOWN DISPOSABLE) ×12 IMPLANT
KIT BASIN OR (CUSTOM PROCEDURE TRAY) ×3 IMPLANT
KIT ROOM TURNOVER OR (KITS) ×3 IMPLANT
NS IRRIG 1000ML POUR BTL (IV SOLUTION) ×6 IMPLANT
PACK PERIPHERAL VASCULAR (CUSTOM PROCEDURE TRAY) ×3 IMPLANT
PAD ARMBOARD 7.5X6 YLW CONV (MISCELLANEOUS) ×6 IMPLANT
PADDING CAST COTTON 6X4 STRL (CAST SUPPLIES) IMPLANT
PATCH HEMASHIELD 8X75 (Vascular Products) ×3 IMPLANT
SET COLLECT BLD 21X3/4 12 (NEEDLE) IMPLANT
STAPLER VISISTAT 35W (STAPLE) IMPLANT
STOPCOCK 4 WAY LG BORE MALE ST (IV SETS) IMPLANT
STRIP CLOSURE SKIN 1/2X4 (GAUZE/BANDAGES/DRESSINGS) ×3 IMPLANT
SUT ETHILON 3 0 PS 1 (SUTURE) IMPLANT
SUT PROLENE 5 0 C 1 24 (SUTURE) ×3 IMPLANT
SUT PROLENE 6 0 CC (SUTURE) ×3 IMPLANT
SUT VIC AB 2-0 CT1 27 (SUTURE) ×1
SUT VIC AB 2-0 CT1 TAPERPNT 27 (SUTURE) ×2 IMPLANT
SUT VIC AB 2-0 CTX 36 (SUTURE) IMPLANT
SUT VIC AB 3-0 SH 27 (SUTURE) ×1
SUT VIC AB 3-0 SH 27X BRD (SUTURE) ×2 IMPLANT
SYR 3ML LL SCALE MARK (SYRINGE) ×3 IMPLANT
TOWEL OR 17X24 6PK STRL BLUE (TOWEL DISPOSABLE) ×6 IMPLANT
TOWEL OR 17X26 10 PK STRL BLUE (TOWEL DISPOSABLE) ×6 IMPLANT
TRAY FOLEY CATH 14FRSI W/METER (CATHETERS) ×3 IMPLANT
TUBING EXTENTION W/L.L. (IV SETS) IMPLANT
UNDERPAD 30X30 INCONTINENT (UNDERPADS AND DIAPERS) ×3 IMPLANT
WATER STERILE IRR 1000ML POUR (IV SOLUTION) ×3 IMPLANT

## 2012-09-29 SURGICAL SUPPLY — 83 items
BAG BANDED W/RUBBER/TAPE 36X54 (MISCELLANEOUS) ×2 IMPLANT
BAG DECANTER FOR FLEXI CONT (MISCELLANEOUS) IMPLANT
BAG SNAP BAND KOVER 36X36 (MISCELLANEOUS) IMPLANT
BALLN CODA OCL 2-9.0-35-120-3 (BALLOONS)
BALLOON COD OCL 2-9.0-35-120-3 (BALLOONS) IMPLANT
BENZOIN TINCTURE PRP APPL 2/3 (GAUZE/BANDAGES/DRESSINGS) ×2 IMPLANT
CANISTER SUCTION 2500CC (MISCELLANEOUS) ×2 IMPLANT
CATH BEACON 5.038 65CM KMP-01 (CATHETERS) ×2 IMPLANT
CATH OMNI FLUSH .035X70CM (CATHETERS) ×2 IMPLANT
CLIP LIGATING EXTRA MED SLVR (CLIP) IMPLANT
CLIP LIGATING EXTRA SM BLUE (MISCELLANEOUS) IMPLANT
CLOTH BEACON ORANGE TIMEOUT ST (SAFETY) ×2 IMPLANT
COVER DOME SNAP 22 D (MISCELLANEOUS) ×2 IMPLANT
COVER MAYO STAND STRL (DRAPES) ×2 IMPLANT
COVER PROBE W GEL 5X96 (DRAPES) ×2 IMPLANT
COVER SURGICAL LIGHT HANDLE (MISCELLANEOUS) ×2 IMPLANT
DEVICE CLOSURE PERCLS PRGLD 6F (VASCULAR PRODUCTS) ×5 IMPLANT
DEVICE TORQUE H2O (MISCELLANEOUS) ×2 IMPLANT
DRAIN CHANNEL 10F 3/8 F FF (DRAIN) IMPLANT
DRAIN CHANNEL 10M FLAT 3/4 FLT (DRAIN) IMPLANT
DRAPE TABLE COVER HEAVY DUTY (DRAPES) ×2 IMPLANT
DRESSING OPSITE X SMALL 2X3 (GAUZE/BANDAGES/DRESSINGS) ×4 IMPLANT
DRYSEAL FLEXSHEATH 12FR 33CM (SHEATH) ×1
DRYSEAL FLEXSHEATH 18FR 33CM (SHEATH) ×1
ELECT CAUTERY BLADE 6.4 (BLADE) ×2 IMPLANT
ELECT REM PT RETURN 9FT ADLT (ELECTROSURGICAL) ×4
ELECTRODE REM PT RTRN 9FT ADLT (ELECTROSURGICAL) ×2 IMPLANT
EVACUATOR 3/16  PVC DRAIN (DRAIN)
EVACUATOR 3/16 PVC DRAIN (DRAIN) IMPLANT
EVACUATOR SILICONE 100CC (DRAIN) IMPLANT
EXCLUDER TRUNK (Endovascular Graft) ×2 IMPLANT
GAUZE SPONGE 2X2 8PLY STRL LF (GAUZE/BANDAGES/DRESSINGS) IMPLANT
GLOVE BIOGEL PI IND STRL 6.5 (GLOVE) ×3 IMPLANT
GLOVE BIOGEL PI IND STRL 7.0 (GLOVE) ×1 IMPLANT
GLOVE BIOGEL PI IND STRL 7.5 (GLOVE) ×1 IMPLANT
GLOVE BIOGEL PI INDICATOR 6.5 (GLOVE) ×3
GLOVE BIOGEL PI INDICATOR 7.0 (GLOVE) ×1
GLOVE BIOGEL PI INDICATOR 7.5 (GLOVE) ×1
GLOVE ECLIPSE 7.0 STRL STRAW (GLOVE) ×2 IMPLANT
GLOVE SS BIOGEL STRL SZ 7 (GLOVE) ×1 IMPLANT
GLOVE SS BIOGEL STRL SZ 7.5 (GLOVE) ×2 IMPLANT
GLOVE SUPERSENSE BIOGEL SZ 7 (GLOVE) ×1
GLOVE SUPERSENSE BIOGEL SZ 7.5 (GLOVE) ×2
GOWN STRL NON-REIN LRG LVL3 (GOWN DISPOSABLE) ×8 IMPLANT
GRAFT BALLN CATH 65CM (STENTS) ×1 IMPLANT
GUIDEWIRE ANGLED .035X150CM (WIRE) ×2 IMPLANT
KIT BASIN OR (CUSTOM PROCEDURE TRAY) ×2 IMPLANT
KIT ROOM TURNOVER OR (KITS) ×2 IMPLANT
LEG CONTRALATERAL 16X18X13.5 (Endovascular Graft) ×2 IMPLANT
NEEDLE PERC 18GX7CM (NEEDLE) ×2 IMPLANT
NS IRRIG 1000ML POUR BTL (IV SOLUTION) ×2 IMPLANT
PACK AORTA (CUSTOM PROCEDURE TRAY) ×2 IMPLANT
PAD ARMBOARD 7.5X6 YLW CONV (MISCELLANEOUS) ×4 IMPLANT
PENCIL BUTTON HOLSTER BLD 10FT (ELECTRODE) ×2 IMPLANT
PERCLOSE PROGLIDE 6F (VASCULAR PRODUCTS) ×10
PROTECTION STATION PRESSURIZED (MISCELLANEOUS) ×2
SHEATH AVANTI 11CM 8FR (MISCELLANEOUS) ×2 IMPLANT
SHEATH BRITE TIP 8FR 23CM (MISCELLANEOUS) ×2 IMPLANT
SHEATH DRYSEAL FLEX 12FR 33CM (SHEATH) ×1 IMPLANT
SHEATH DRYSEAL FLEX 18FR 33CM (SHEATH) ×1 IMPLANT
SPONGE GAUZE 2X2 STER 10/PKG (GAUZE/BANDAGES/DRESSINGS)
STAPLER VISISTAT 35W (STAPLE) IMPLANT
STATION PROTECTION PRESSURIZED (MISCELLANEOUS) ×1 IMPLANT
STENT GRAFT BALLN CATH 65CM (STENTS) ×1
STOPCOCK MORSE 400PSI 3WAY (MISCELLANEOUS) ×2 IMPLANT
STRIP CLOSURE SKIN 1/2X4 (GAUZE/BANDAGES/DRESSINGS) ×2 IMPLANT
SUT ETHILON 3 0 PS 1 (SUTURE) IMPLANT
SUT PROLENE 5 0 C 1 24 (SUTURE) IMPLANT
SUT VIC AB 2-0 CTX 36 (SUTURE) IMPLANT
SUT VIC AB 3-0 SH 27 (SUTURE)
SUT VIC AB 3-0 SH 27X BRD (SUTURE) IMPLANT
SUT VICRYL 4-0 PS2 18IN ABS (SUTURE) ×4 IMPLANT
SYR 20CC LL (SYRINGE) ×4 IMPLANT
SYR 30ML LL (SYRINGE) IMPLANT
SYR 5ML LL (SYRINGE) ×2 IMPLANT
SYR MEDRAD MARK V 150ML (SYRINGE) ×2 IMPLANT
SYRINGE 10CC LL (SYRINGE) ×6 IMPLANT
TOWEL OR 17X24 6PK STRL BLUE (TOWEL DISPOSABLE) ×4 IMPLANT
TOWEL OR 17X26 10 PK STRL BLUE (TOWEL DISPOSABLE) ×4 IMPLANT
TRAY FOLEY CATH 14FRSI W/METER (CATHETERS) ×2 IMPLANT
TUBING HIGH PRESSURE 120CM (CONNECTOR) ×2 IMPLANT
WIRE AMPLATZ SS-J .035X180CM (WIRE) ×4 IMPLANT
WIRE BENTSON .035X145CM (WIRE) ×6 IMPLANT

## 2012-09-29 NOTE — Preoperative (Signed)
Beta Blockers   Reason not to administer Beta Blockers:Not Applicable 

## 2012-09-29 NOTE — Interval H&P Note (Signed)
History and Physical Interval Note:  09/29/2012 7:11 AM  Lissa Hoard T Parodi  has presented today for surgery, with the diagnosis of AAA  The various methods of treatment have been discussed with the patient and family. After consideration of risks, benefits and other options for treatment, the patient has consented to  Procedure(s) (LRB) with comments: ABDOMINAL AORTIC ENDOVASCULAR STENT GRAFT (N/A) - GORE as a surgical intervention .  The patient's history has been reviewed, patient examined, no change in status, stable for surgery.  I have reviewed the patient's chart and labs.  Questions were answered to the patient's satisfaction.     Petros Ahart

## 2012-09-29 NOTE — Op Note (Signed)
OPERATIVE REPORT  DATE OF SURGERY: 09/29/2012  PATIENT: Marc [name], 78 y.o. male MRN: [id number]  DOB: 1930-04-16  PRE-OPERATIVE DIAGNOSIS: Infrarenal abdominal aortic aneurysm  POST-OPERATIVE DIAGNOSIS:  Same  PROCEDURE: Gore stent graft repair of infrarenal abdominal aortic aneurysm  SURGEON:  Curt Jews, M.D.  PHYSICIAN ASSISTANT: Collins  ANESTHESIA:  Gen.  EBL: 100 ml  Total I/O In: 1800 [I.V.:1800] Out: 200 [Urine:100; Blood:100]  BLOOD ADMINISTERED: None  DRAINS: None  SPECIMEN: None  COUNTS CORRECT:  YES  PLAN OF CARE: PACU   PATIENT DISPOSITION:  PACU - hemodynamically stable  PROCEDURE DETAILS: The patient was taken to the operating room placed supine position where the area of the abdomen both groins were prepped and draped in the usual sterile fashion. Using ultrasound guidance 18-gauge needle was used to access both common femoral arteries. A Benson wire was passed centrally and a 2 Perclose devices were placed at 11:00 and 1:00 and both groins. A French sheath were passed bilaterally. A marker Omni Flush catheter was passed over the guidewire above the level of the renal artery on the left and a repeat injection was used to show the level of the renal arteries and the bifurcation. The main body was chosen as a 23 x 12 18 cm graft be placed through the left groin. The 18 sheath was exchanged over the left groin and was positioned in the aorta. The main body was passed through the left groin she with crossed leg configuration and was positioned just below the level of renal arteries. The marker pigtail catheter was positioned over the right wire below the renals and confirmation of the position was obtained. The main body proximal portion was deployed. Next the pigtail catheter was withdrawn back into the aortic sac from the right side and a Kumpe catheter and angled Glidewire were used to cannulate the contralateral gate. This was then had a retrograde injection  through the right groin sheath showed the level of the hypogastric takeoff. An 18 x 13.5 cm contralateral limb was chosen. This was deployed with the appropriate overlap on the contralateral gate. The proximal and distal attachment sites and the junction were all dilated with balloon. A tunnel catheter was repositioned and was completion arteriogram showed excellent positioning with no evidence of endoleak. The Perclose devices were then deployed after removing the sheath on the side. Excellent hemostasis was obtained. The groin puncture were closed with 4 subcuticular Vicryl stitch bilaterally. Patient has normal femoral pulses bilaterally. The patient does have known superficial artery occlusion with no palpable popliteal or 2 pulses. He did have well-perfused feet bilaterally. He was transferred to the holding area in stable condition   Curt Jews, M.D. 09/29/2012 9:58 AM

## 2012-09-29 NOTE — Progress Notes (Signed)
Utilization review completed.  

## 2012-09-29 NOTE — Transfer of Care (Signed)
Immediate Anesthesia Transfer of Care Note  Patient: Marc [name]  Procedure(s) Performed: Procedure(s) (LRB) with comments: ABDOMINAL AORTIC ENDOVASCULAR STENT GRAFT (N/A) - GORE; ultrasound guided.  Patient Location: PACU  Anesthesia Type:General  Level of Consciousness: awake, alert  and oriented  Airway & Oxygen Therapy: Patient Spontanous Breathing and Patient connected to nasal cannula oxygen  Post-op Assessment: Report given to PACU RN, Post -op Vital signs reviewed and stable, Patient moving all extremities and Patient moving all extremities X 4  Post vital signs: Reviewed and stable  Complications: No apparent anesthesia complications

## 2012-09-29 NOTE — Anesthesia Preprocedure Evaluation (Addendum)
Anesthesia Evaluation  Patient identified by MRN, date of birth, ID band Patient awake    Reviewed: Allergy & Precautions, H&P , NPO status , Patient's Chart, lab work & pertinent test results, reviewed documented beta blocker date and time   Airway Mallampati: II TM Distance: >3 FB Neck ROM: full    Dental  (+) Teeth Intact and Dental Advidsory Given   Pulmonary          Cardiovascular hypertension, On Home Beta Blockers + CAD     Neuro/Psych CVA, Residual Symptoms    GI/Hepatic GERD-  Poorly Controlled,  Endo/Other    Renal/GU Renal disease     Musculoskeletal   Abdominal   Peds  Hematology   Anesthesia Other Findings   Reproductive/Obstetrics                          Anesthesia Physical Anesthesia Plan  ASA: III  Anesthesia Plan: General   Post-op Pain Management:    Induction: Intravenous  Airway Management Planned: Oral ETT  Additional Equipment: Arterial line  Intra-op Plan:   Post-operative Plan: Extubation in OR  Informed Consent: I have reviewed the patients History and Physical, chart, labs and discussed the procedure including the risks, benefits and alternatives for the proposed anesthesia with the patient or authorized representative who has indicated his/her understanding and acceptance.   Dental Advisory Given  Plan Discussed with: CRNA and Surgeon  Anesthesia Plan Comments:        Anesthesia Quick Evaluation

## 2012-09-29 NOTE — Anesthesia Postprocedure Evaluation (Signed)
Anesthesia Post Note  Patient: Marc [name]  Procedure(s) Performed: Procedure(s) (LRB): ABDOMINAL AORTIC ENDOVASCULAR STENT GRAFT (N/A)  Anesthesia type: general  Patient location: PACU  Post pain: Pain level controlled  Post assessment: Patient's Cardiovascular Status Stable  Last Vitals:  Filed Vitals:   09/29/12 1428  BP: 144/54  Pulse:   Temp: 36.5 C  Resp:     Post vital signs: Reviewed and stable  Level of consciousness: sedated  Complications: No apparent anesthesia complications

## 2012-09-29 NOTE — H&P (View-Only) (Signed)
Patient has today for further discussion of his abdominal aortic aneurysm. He undergone evaluation where he was having an enlarging aneurysm. He was scheduled for elective repair and preoperative cardiac clearance revealed critical cardiac disease. He successfully underwent coronary bypass grafting by Dr. Servando Snare and has made a good recovery. He did have flank pain 2 weeks ago and was seen in the emergency department with the diagnosis of the renal calculus for his pain. CT scan showed no evidence of rupture at that time with a 6 cm infrarenal abdominal aortic aneurysm.  Past Medical History  Diagnosis Date  . AAA (abdominal aortic aneurysm)   . Hypertension   . Hyperlipidemia   . GERD (gastroesophageal reflux disease)   . Arthritis   . Stroke 2012    affecting RUE  . Coronary artery disease   . History of ulcerative colitis   . History of Clostridium difficile infection   . Gall stones     History  Substance Use Topics  . Smoking status: Former Smoker -- 1.0 packs/day for 68 years    Types: Cigarettes    Quit date: 08/04/2012  . Smokeless tobacco: Never Used  . Alcohol Use: No    Family History  Problem Relation Age of Onset  . Stroke Mother     No Known Allergies  Current outpatient prescriptions:aspirin EC 81 MG tablet, Take 81 mg by mouth daily., Disp: , Rfl: ;  atorvastatin (LIPITOR) 20 MG tablet, Take 1 tablet (20 mg total) by mouth daily at 6 PM., Disp: 30 tablet, Rfl: 1;  ibuprofen (ADVIL,MOTRIN) 200 MG tablet, Take 400 mg by mouth every 6 (six) hours as needed. For pain, Disp: , Rfl:  metoprolol tartrate (LOPRESSOR) 25 MG tablet, Take 0.5 tablets (12.5 mg total) by mouth 2 (two) times daily., Disp: 30 tablet, Rfl: 1;  Multiple Vitamin (MULTIVITAMIN WITH MINERALS) TABS, Take 1 tablet by mouth daily., Disp: , Rfl: ;  oxyCODONE-acetaminophen (PERCOCET/ROXICET) 5-325 MG per tablet, Take 1 tablet by mouth every 6 (six) hours as needed for pain., Disp: 20 tablet, Rfl:  0 ranitidine (ZANTAC) 150 MG capsule, Take 150 mg by mouth 2 (two) times daily as needed. For acid reflux, Disp: , Rfl: ;  traMADol (ULTRAM) 50 MG tablet, Take 1 tablet (50 mg total) by mouth every 6 (six) hours as needed for pain., Disp: 40 tablet, Rfl: 0  BP 143/75  Pulse 87  Ht 5' 7.5" (1.715 m)  Wt 147 lb (66.679 kg)  BMI 22.68 kg/m2  SpO2 100%  Body mass index is 22.68 kg/(m^2).       Physical exam: Well-developed well-nourished white male no acute distress Heart regular rate and rhythm Chest clear bilaterally Abdomen easily palpable aneurysm which is nontender 2+ femoral pulses and absent pedal pulses.  Impression and plan: 6 cm infrarenal abdominal aortic aneurysm. Have recommended elective repair of the stent graft. We will re\re size is aneurysm with his most recent CT scan from 2 weeks ago and schedule his procedure at his convenience. In all likelihood he will have a percutaneous access versus a small incisional cutdown in his groin. I explained that it is one night hospitalization assuming no perioperative events.

## 2012-09-29 NOTE — Anesthesia Postprocedure Evaluation (Signed)
  Anesthesia Post-op Note  Patient: Marc [name]  Procedure(s) Performed: Procedure(s) (LRB) with comments: FEMORAL ARTERY EXPLORATION (Right) PATCH ANGIOPLASTY (Right) ENDARTERECTOMY FEMORAL (Right)  Patient Location: PACU  Anesthesia Type:General  Level of Consciousness: awake, alert  and oriented  Airway and Oxygen Therapy: Patient Spontanous Breathing and Patient connected to nasal cannula oxygen  Post-op Pain: mild  Post-op Assessment: Post-op Vital signs reviewed and Patient's Cardiovascular Status Stable  Post-op Vital Signs: Reviewed and stable  Complications: No apparent anesthesia complications

## 2012-09-29 NOTE — Anesthesia Procedure Notes (Signed)
Procedure Name: Intubation Date/Time: 09/29/2012 7:39 AM Performed by: Neldon Newport Pre-anesthesia Checklist: Emergency Drugs available, Patient identified, Timeout performed, Suction available and Patient being monitored Patient Re-evaluated:Patient Re-evaluated prior to inductionOxygen Delivery Method: Circle system utilized Preoxygenation: Pre-oxygenation with 100% oxygen Intubation Type: IV induction Ventilation: Mask ventilation without difficulty Laryngoscope Size: Mac and 3 Grade View: Grade II Tube type: Oral Tube size: 7.5 mm Number of attempts: 1 Placement Confirmation: ETT inserted through vocal cords under direct vision,  positive ETCO2 and breath sounds checked- equal and bilateral Secured at: 22 cm Tube secured with: Tape Dental Injury: Teeth and Oropharynx as per pre-operative assessment

## 2012-09-29 NOTE — Op Note (Signed)
OPERATIVE REPORT  DATE OF SURGERY: 09/29/2012  PATIENT: Marc [name], 78 y.o. male MRN: [id number]  DOB: 1930/04/04  PRE-OPERATIVE DIAGNOSIS: Right leg ischemia following endovascular repair of abdominal aortic aneurysm  POST-OPERATIVE DIAGNOSIS:  Same  PROCEDURE: Exploration of right common femoral artery, common femoral endarterectomy and Dacron patch angioplasty  SURGEON:  Curt Jews, M.D.  PHYSICIAN ASSISTANT: Collins  ANESTHESIA:  Gen.  EBL: Minimal ml  Total I/O In: 2700 [I.V.:2700] Out: 900 [Urine:700; Blood:200]  BLOOD ADMINISTERED: None  DRAINS: None  SPECIMEN: Endarterectomy  COUNTS CORRECT:  YES  PLAN OF CARE: PACU   PATIENT DISPOSITION:  PACU - hemodynamically stable  PROCEDURE DETAILS: The patient undergone a stent graft repair of abdominal aortic aneurysm prior in the day. He had a known bilateral superficial femoral artery occlusions. He had percutaneous access of both common femoral arteries and had a well-perfused foot into the case. He lost Doppler signal in the PACU and had mottling of his foot. He was returned immediately to the operating room for exploration. Common femoral pulse. The right groin and right leg were prepped in the sterile fashion. Incision was made over the femoral artery on the right and carried down to isolate the common femoral artery. The Perclose sutures were noted in the anterior wall of the common femoral artery in appropriate location. There was pulse above this and no pulse below. The common femoral artery was exposed up under the inguinal ligament and the superficial femoral and function was arteries were encircled with Vesseloops for control. Superficial femoral artery was chronically occluded at its ostium. The patient was given 6000 intravenous heparin after extrication time the common femoral artery was occluded up under the inguinal ligament and the superficial artery was controlled with a Gregory clamp. The Perclose sutures  were removed and there was plaque noted extruding from this area in the hole that was resulting from the sheath. The artery was opened longitudinally with Potts scissors through this arterial puncture. There was a flap created with plaque. This was endarterectomized proximally with good endpoint. This did extend down toward the level of the fundus takeoff. The plaque was divided above the level of the profundus from its takeoff. The posterior flap was tacked with 5-0 Prolene sutures. Remaining debris was removed from the endarterectomy site. A Finesse Hemashield Dacron patch was brought onto the field and was sewn as a patch angioplasty over the common femoral artery beginning at the level of the inguinal ligament extended down onto the proximal occluded superficial femoral artery. Prior to completion of the anastomosis flushing maneuvers were undertaken. Anastomosis was completed and flow is restored. The patient had good Doppler flow in the profunda and also at the anterior tibial and posterior tibial arteries above the ankle. The patient was given 50 mg of protamine to reverse the heparin. Wounds were closed with 2-0 Vicryl in several layers with subcutaneous tissue and skin was closed with 30 subjective Vicryl stitch. Sterile dressing was applied and the patient was taken to the recovery in stable condition   Curt Jews, M.D. 09/29/2012 11:59 AM

## 2012-09-29 NOTE — Progress Notes (Signed)
Pt admitted to 3300 from PACU. Oriented to unit and room. Safety discussed and call bell with in reach. VSS. Pulses dopplerable. Right groin dressing has shadowing and left is CDI.  Erick Blinks, RN

## 2012-09-29 NOTE — Transfer of Care (Signed)
Immediate Anesthesia Transfer of Care Note  Patient: Marc [name]  Procedure(s) Performed: Procedure(s) (LRB) with comments: FEMORAL ARTERY EXPLORATION (Right) PATCH ANGIOPLASTY (Right) ENDARTERECTOMY FEMORAL (Right)  Patient Location: PACU  Anesthesia Type:General  Level of Consciousness: awake, alert  and sedated  Airway & Oxygen Therapy: Patient Spontanous Breathing and Patient connected to nasal cannula oxygen  Post-op Assessment: Report given to PACU RN, Post -op Vital signs reviewed and stable, Patient moving all extremities and Patient moving all extremities X 4  Post vital signs: Reviewed and stable  Complications: No apparent anesthesia complications

## 2012-09-29 NOTE — Progress Notes (Signed)
ANTIBIOTIC CONSULT NOTE - INITIAL  Pharmacy Consult for antibiotics renal dose adjustment Indication: surgical prophylaxis  No Known Allergies  Patient Measurements: Height: 5' 7.5" (171.5 cm) Weight: 168 lb 10.4 oz (76.5 kg) IBW/kg (Calculated) : 67.25   Vital Signs: Temp: 97.7 F (36.5 C) (11/04 1428) Temp src: Oral (11/04 1428) BP: 144/54 mmHg (11/04 1428) Pulse Rate: 73  (11/04 1400) Intake/Output from previous day:   Intake/Output from this shift: Total I/O In: 2700 [I.V.:2700] Out: 1350 [Urine:1150; Blood:200]  Labs:  Basename 09/29/12 1000  WBC 8.3  HGB 9.4*  PLT 131*  LABCREA --  CREATININE 1.31   Estimated Creatinine Clearance: 42.8 ml/min (by C-G formula based on Cr of 1.31). No results found for this basename: VANCOTROUGH:2,VANCOPEAK:2,VANCORANDOM:2,GENTTROUGH:2,GENTPEAK:2,GENTRANDOM:2,TOBRATROUGH:2,TOBRAPEAK:2,TOBRARND:2,AMIKACINPEAK:2,AMIKACINTROU:2,AMIKACIN:2, in the last 72 hours   Microbiology: Recent Results (from the past 720 hour(s))  SURGICAL PCR SCREEN     Status: Normal   Collection Time   09/23/12 12:01 PM      Component Value Range Status Comment   MRSA, PCR NEGATIVE  NEGATIVE Final    Staphylococcus aureus NEGATIVE  NEGATIVE Final     Medical History: Past Medical History  Diagnosis Date  . AAA (abdominal aortic aneurysm)   . Hypertension   . Hyperlipidemia   . GERD (gastroesophageal reflux disease)   . Arthritis   . Stroke 2012    affecting RUE  . History of ulcerative colitis   . History of Clostridium difficile infection   . Gall stones   . Coronary artery disease     s/p CABG Dr. Servando Snare, Dr. Acie Fredrickson  . Kidney stone on left side     has not passed as of 09/23/12    Assessment: 89 YOM with AAA, s/p abdominal aortic endovascular stent graft, started zinacef 1.5g IV Q 12hr x 2 for post-op surgical prophylaxis. Scr 1.31 close to baseline, est. crcl ~ 15ml/min. Afebrile, wbc wnl. Zinacef dose is appropriate. Pre-op dose was  given at 0800 this morning.  Goal of Therapy:  Prevent infection  Plan:  - Continue zinacef 1.5 g IV Q 12 hrs x 2 as ordered. - No dose adjustment is needed, pharmacy sign off  Maryanna Shape, PharmD, BCPS  Clinical Pharmacist  Pager: [phone]  09/29/2012,3:26 PM

## 2012-09-30 ENCOUNTER — Telehealth: Payer: Self-pay | Admitting: Vascular Surgery

## 2012-09-30 ENCOUNTER — Encounter (HOSPITAL_COMMUNITY): Payer: Self-pay | Admitting: Vascular Surgery

## 2012-09-30 ENCOUNTER — Other Ambulatory Visit: Payer: Self-pay | Admitting: *Deleted

## 2012-09-30 DIAGNOSIS — Z48812 Encounter for surgical aftercare following surgery on the circulatory system: Secondary | ICD-10-CM

## 2012-09-30 DIAGNOSIS — I714 Abdominal aortic aneurysm, without rupture: Secondary | ICD-10-CM

## 2012-09-30 LAB — BASIC METABOLIC PANEL
BUN: 19 mg/dL (ref 6–23)
Calcium: 8.1 mg/dL — ABNORMAL LOW (ref 8.4–10.5)
GFR calc non Af Amer: 42 mL/min — ABNORMAL LOW (ref >90)
Glucose, Bld: 107 mg/dL — ABNORMAL HIGH (ref 70–99)
Potassium: 3.9 mEq/L (ref 3.5–5.1)

## 2012-09-30 LAB — CBC
HCT: 28.9 % — ABNORMAL LOW (ref 39.0–52.0)
Hemoglobin: 9.4 g/dL — ABNORMAL LOW (ref 13.0–17.0)
MCH: 28.4 pg (ref 26.0–34.0)
MCHC: 32.5 g/dL (ref 30.0–36.0)
MCV: 87.3 fL (ref 78.0–100.0)

## 2012-09-30 MED ORDER — OXYCODONE-ACETAMINOPHEN 5-325 MG PO TABS: 1.0000 | ORAL_TABLET | ORAL | Status: DC | PRN

## 2012-09-30 NOTE — Progress Notes (Addendum)
[facility] [place]  Post-op  Intra-abdominal Surgery note  Date of Surgery: 09/29/2012 Surgeon: Surgeon(s): Rosetta Posner, MD POD: 1 Day Post-Op Procedure(s): FEMORAL ARTERY EXPLORATION PATCH ANGIOPLASTY ENDARTERECTOMY FEMORAL  History of Present Illness  Marc [name] is a 78 y.o. male who is  up s/p Procedure(s): Gore stent graft repair of infrarenal abdominal aortic aneurysm FEMORAL ARTERY EXPLORATION PATCH ANGIOPLASTY ENDARTERECTOMY FEMORAL Pt is doing well. denies incisional pain; denies nausea/vomiting; denies diarrhea. has had flatus;has not had BM  IMAGING: Dg Chest Portable 1 View  09/29/2012  *RADIOLOGY REPORT*  Clinical Data: Recent abdominal aortic stent graft repair  PORTABLE CHEST - 1 VIEW  Comparison: 09/05/2012  Findings: Postsurgical changes are again seen.  The lungs are clear.  Cardiac shadow is stable.  No pneumothorax is noted.  IMPRESSION: No acute intrathoracic abnormality.   Original Report Authenticated By: Inez Catalina, M.D.    Dg Abd Portable 1v  09/29/2012  *RADIOLOGY REPORT*  Clinical Data: Abdominal aortic stent  PORTABLE ABDOMEN - 1 VIEW  Comparison: 09/03/2012 CT  Findings: Aortic atherosclerosis.  Interval aortobi-iliac stent placement, extends over the L1 through S1 levels.  6 x 17 mm radiopaque density projecting over the left upper quadrant is of uncertain etiology.  Cardiomegaly.  Status post median sternotomy. Multilevel degenerative changes. Bowel gas pattern nonobstructive.  IMPRESSION: Interval aortobi-iliac stent placement.  6 x 17 mm radiopaque density projecting over the left upper quadrant is of uncertain etiology.   Original Report Authenticated By: Carlos Levering, M.D.     Significant Diagnostic Studies: CBC Lab Results  Component Value Date   WBC 7.9 09/30/2012   HGB 9.4* 09/30/2012   HCT 28.9* 09/30/2012   MCV 87.3 09/30/2012   PLT 121* 09/30/2012    BMET    Component Value Date/Time   NA 136 09/30/2012 0500    K 3.9 09/30/2012 0500   CL 103 09/30/2012 0500   CO2 25 09/30/2012 0500   GLUCOSE 107* 09/30/2012 0500   BUN 19 09/30/2012 0500   CREATININE 1.52* 09/30/2012 0500   CREATININE 1.46* 07/03/2012 1436   CALCIUM 8.1* 09/30/2012 0500   GFRNONAA 42* 09/30/2012 0500   GFRAA 48* 09/30/2012 0500    COAG Lab Results  Component Value Date   INR 1.31 09/29/2012   INR 1.01 09/23/2012   INR 1.08 09/03/2012   No results found for this basename: PTT    I/O last 3 completed shifts: In: [id number] [P.O.:40; I.V.:4700] Out: 3000 [Urine:2800; Blood:200]    Physical Examination BP Readings from Last 3 Encounters:  09/30/12 115/53  09/30/12 115/53  09/30/12 115/53   Temp Readings from Last 3 Encounters:  09/30/12 98.4 F (36.9 C) Oral  09/30/12 98.4 F (36.9 C) Oral  09/30/12 98.4 F (36.9 C) Oral   SpO2 Readings from Last 3 Encounters:  09/30/12 99%  09/30/12 99%  09/30/12 99%   Pulse Readings from Last 3 Encounters:  09/30/12 71  09/30/12 71  09/30/12 71    General: A&O x 3, WDWN male in NAD Pulmonary: normal non-labored breathing , without Rales, rhonchi,  wheezing Cardiac: Heart rate : regular ,  Abdomen:abdomen soft Abdominal wound:clean, dry, intact  Neurologic: A&O X 3; Appropriate Affect ; SENSATION: normal; MOTOR FUNCTION:  moving all extremities equally. Speech is fluent/normal  Vascular Exam:BLE warm and well perfused Extremities without ischemic changes, no Gangrene, no cellulitis; no open wounds;   LOWER EXTREMITY PULSES           RIGHT  LEFT      POSTERIOR TIBIAL  palpable  palpable       DORSALIS PEDIS      ANTERIOR TIBIAL  palpable  palpable          Assessment/Plan: Marc [name] is a 78 y.o. male who is 1 Day Post-Op Procedure(s): FEMORAL ARTERY EXPLORATION PATCH ANGIOPLASTY ENDARTERECTOMY FEMORAL Gore stent graft repair of infrarenal abdominal aortic aneurysm       Laurence Slate [facility] [id number] 09/30/2012 7:18  AM      I have examined the patient, reviewed and agree with above.  Porcha Deblanc, MD 09/30/2012 7:43 AM

## 2012-09-30 NOTE — Progress Notes (Signed)
Discussed discharge instructions and medications with patient and his daughter. Both verbalized understanding with all questions answered. VSS. Pt discharged home with daughter.  [facility]

## 2012-09-30 NOTE — Discharge Summary (Signed)
[facility] Discharge Summary   Patient ID:  Marc [name] MRN: [id number] DOB/AGE: May 27, 1930 79 y.o.  Admit date: 09/29/2012 Discharge date: 09/30/2012 Date of Surgery: 09/29/2012 Surgeon: Surgeon(s): Rosetta Posner, MD  Admission Diagnosis: AAA embolectomy-post op  Discharge Diagnoses:  AAA embolectomy-post op  Secondary Diagnoses: Past Medical History  Diagnosis Date  . AAA (abdominal aortic aneurysm)   . Hypertension   . Hyperlipidemia   . GERD (gastroesophageal reflux disease)   . Arthritis   . Stroke 2012    affecting RUE  . History of ulcerative colitis   . History of Clostridium difficile infection   . Gall stones   . Coronary artery disease     s/p CABG Dr. Servando Snare, Dr. Acie Fredrickson  . Kidney stone on left side     has not passed as of 09/23/12    Procedure(s): FEMORAL ARTERY EXPLORATION PATCH ANGIOPLASTY ENDARTERECTOMY FEMORAL  Discharged Condition: good  HPI: Patient has today for further discussion of his abdominal aortic aneurysm. He undergone evaluation where he was having an enlarging aneurysm. He was scheduled for elective repair and preoperative cardiac clearance revealed critical cardiac disease. He successfully underwent coronary bypass grafting by Dr. Servando Snare and has made a good recovery. He did have flank pain 2 weeks ago and was seen in the emergency department with the diagnosis of the renal calculus for his pain. CT scan showed no evidence of rupture at that time with a 6 cm infrarenal abdominal aortic aneurysm.    Hospital Course:  Marc [name] is a 78 y.o. male is S/P Gore stent graft repair of infrarenal abdominal aortic aneurysm Right Procedure(s): Right FEMORAL ARTERY EXPLORATION PATCH ANGIOPLASTY ENDARTERECTOMY FEMORAL Extubated: POD # 0 Post-op wounds clean, dry, intact or healing well Pt. Ambulating, voiding and taking PO diet without difficulty. Pt pain controlled with PO pain meds. Labs as  below Complications:Right leg ischemia following endovascular repair of abdominal aortic aneurysm.  He was returned to the OR for Exploration of right common femoral artery, common femoral endarterectomy and Dacron patch angioplasty.    Consults:     Significant Diagnostic Studies: CBC Lab Results  Component Value Date   WBC 7.9 09/30/2012   HGB 9.4* 09/30/2012   HCT 28.9* 09/30/2012   MCV 87.3 09/30/2012   PLT 121* 09/30/2012    BMET    Component Value Date/Time   NA 136 09/30/2012 0500   K 3.9 09/30/2012 0500   CL 103 09/30/2012 0500   CO2 25 09/30/2012 0500   GLUCOSE 107* 09/30/2012 0500   BUN 19 09/30/2012 0500   CREATININE 1.52* 09/30/2012 0500   CREATININE 1.46* 07/03/2012 1436   CALCIUM 8.1* 09/30/2012 0500   GFRNONAA 42* 09/30/2012 0500   GFRAA 48* 09/30/2012 0500   COAG Lab Results  Component Value Date   INR 1.31 09/29/2012   INR 1.01 09/23/2012   INR 1.08 09/03/2012     Disposition:  Discharge to :Home Discharge Orders    Future Orders Please Complete By Expires   Resume previous diet      Driving Restrictions      Comments:   No driving for 4 weeks   Lifting restrictions      Comments:   No lifting for 8 weeks   Call MD for:  temperature >100.5      Call MD for:  redness, tenderness, or signs of infection (pain, swelling, bleeding, redness, odor or green/yellow discharge around incision site)      Call MD  for:  severe or increased pain, loss or decreased feeling  in affected limb(s)      Increase activity slowly      Comments:   Walk with assistance use walker or cane as needed   May shower       Scheduling Instructions:   May shower with soap and water tomorrow.      Kruszka, Void Wortham  Home Medication Instructions [id number]   Printed on:09/30/12 0725  Medication Information                    ranitidine (ZANTAC) 150 MG capsule Take 150 mg by mouth 2 (two) times daily as needed. For acid reflux           aspirin EC 81 MG tablet Take 81 mg by mouth  daily.           Multiple Vitamin (MULTIVITAMIN WITH MINERALS) TABS Take 1 tablet by mouth daily.           atorvastatin (LIPITOR) 20 MG tablet Take 1 tablet (20 mg total) by mouth daily at 6 PM.           metoprolol tartrate (LOPRESSOR) 25 MG tablet Take 0.5 tablets (12.5 mg total) by mouth 2 (two) times daily.           traMADol (ULTRAM) 50 MG tablet Take 1 tablet (50 mg total) by mouth every 6 (six) hours as needed for pain.           ibuprofen (ADVIL,MOTRIN) 200 MG tablet Take 400 mg by mouth every 6 (six) hours as needed. For pain           oxyCODONE-acetaminophen (PERCOCET/ROXICET) 5-325 MG per tablet Take 1 tablet by mouth every 6 (six) hours as needed for pain.           oxyCODONE-acetaminophen (PERCOCET/ROXICET) 5-325 MG per tablet Take 1-2 tablets by mouth every 4 (four) hours as needed.            Verbal and written Discharge instructions given to the patient. Wound care per Discharge AVS F/U in 4 weeks for CTA.  SignedRoxy Horseman 09/30/2012, 7:25 AM

## 2012-09-30 NOTE — Plan of Care (Signed)
Problem: Phase II Discharge Progression Outcomes Goal: Vascular Site Scale level 0 - I on room air Outcome: Completed/Met Date Met:  09/30/12 Right groin level 1

## 2012-09-30 NOTE — Progress Notes (Signed)
Nutrition Brief Note  Patient identified on the Malnutrition Screening Tool (MST) Report for weight loss and poor appetite.  From review of EMR, patient has not lost any weight.  Wt Readings from Last 10 Encounters:  09/29/12 168 lb 10.4 oz (76.5 kg)  09/29/12 168 lb 10.4 oz (76.5 kg)  09/29/12 168 lb 10.4 oz (76.5 kg)  09/23/12 148 lb 11.2 oz (67.45 kg)  09/16/12 147 lb (66.679 kg)  09/05/12 150 lb (68.04 kg)  08/10/12 160 lb (72.576 kg)  08/10/12 160 lb (72.576 kg)  08/04/12 153 lb 5 oz (69.542 kg)  08/01/12 156 lb (70.761 kg)     Body mass index is 26.02 kg/(m^2). Pt meets criteria for overweight based on current BMI.   Current diet order is Heart Healthy, patient is consuming approximately 75% of meals at this time. Labs and medications reviewed.  Plans for discharge home today.  No nutrition interventions warranted at this time. If nutrition issues arise, please consult RD.   Molli Barrows, RD, LDN, [place] Pager# [phone] After Hours Pager# [phone]

## 2012-09-30 NOTE — Telephone Encounter (Signed)
Message copied by Berniece Salines on Tue Sep 30, 2012  3:08 PM ------      Message from: Alfonso Patten      Created: Tue Sep 30, 2012 10:57 AM                   ----- Message -----         From: Ulyses Amor, PA         Sent: 09/30/2012   7:22 AM           To: Alfonso Patten, RN            F/U with Dr. Donnetta Hutching in 4 weeks s/p EVAR and femoral endarterectomy with patch.  Needs CTA before visit. Thanks [facility]

## 2012-10-03 MED ORDER — ONDANSETRON HCL 4 MG/2ML IJ SOLN
INTRAMUSCULAR | Status: DC | PRN
  Administered 2012-09-29: 4 mg via INTRAVENOUS

## 2012-10-03 MED ORDER — LACTATED RINGERS IV SOLN
INTRAVENOUS | Status: DC | PRN
  Administered 2012-09-29: 11:00:00 via INTRAVENOUS

## 2012-10-03 MED ORDER — LIDOCAINE HCL (CARDIAC) 20 MG/ML IV SOLN
INTRAVENOUS | Status: DC | PRN
  Administered 2012-09-29: 50 mg via INTRAVENOUS

## 2012-10-03 MED ORDER — HEPARIN SODIUM (PORCINE) 1000 UNIT/ML IJ SOLN
INTRAMUSCULAR | Status: DC | PRN
  Administered 2012-09-29: 6000 [IU] via INTRAVENOUS

## 2012-10-03 MED ORDER — VECURONIUM BROMIDE 10 MG IV SOLR
INTRAVENOUS | Status: DC | PRN
  Administered 2012-09-29: 7 mg via INTRAVENOUS

## 2012-10-03 MED ORDER — CEFAZOLIN SODIUM 1-5 GM-% IV SOLN
INTRAVENOUS | Status: DC | PRN
  Administered 2012-09-29: 1 g via INTRAVENOUS

## 2012-10-03 MED ORDER — LACTATED RINGERS IV SOLN
INTRAVENOUS | Status: DC | PRN
  Administered 2012-09-29 (×2): via INTRAVENOUS

## 2012-10-03 MED ORDER — NEOSTIGMINE METHYLSULFATE 1 MG/ML IJ SOLN
INTRAMUSCULAR | Status: DC | PRN
  Administered 2012-09-29: 2 mg via INTRAVENOUS

## 2012-10-03 MED ORDER — PROPOFOL 10 MG/ML IV BOLUS
INTRAVENOUS | Status: DC | PRN
  Administered 2012-09-29: 100 mg via INTRAVENOUS

## 2012-10-03 MED ORDER — FENTANYL CITRATE 0.05 MG/ML IJ SOLN
INTRAMUSCULAR | Status: DC | PRN
  Administered 2012-09-29: 100 ug via INTRAVENOUS

## 2012-10-03 MED ORDER — GLYCOPYRROLATE 0.2 MG/ML IJ SOLN
INTRAMUSCULAR | Status: DC | PRN
  Administered 2012-09-29: 0.4 mg via INTRAVENOUS

## 2012-10-03 MED ORDER — PROTAMINE SULFATE 10 MG/ML IV SOLN
INTRAVENOUS | Status: DC | PRN
  Administered 2012-09-29: 10 mg via INTRAVENOUS
  Administered 2012-09-29 (×2): 20 mg via INTRAVENOUS

## 2012-10-03 NOTE — Addendum Note (Signed)
Addendum  created 10/03/12 1513 by Neldon Newport, CRNA   Modules edited:Anesthesia Events, Anesthesia Flowsheet, Anesthesia Medication Administration

## 2012-10-03 NOTE — Addendum Note (Signed)
Addendum  created 10/03/12 1531 by Neldon Newport, CRNA   Modules edited:Anesthesia Flowsheet, Anesthesia Medication Administration

## 2012-10-03 NOTE — Anesthesia Preprocedure Evaluation (Addendum)
Anesthesia Evaluation  Patient identified by MRN, date of birth, ID band Patient awake    Reviewed: Allergy & Precautions, H&P , NPO status , Patient's Chart, lab work & pertinent test results, reviewed documented beta blocker date and time   Airway Mallampati: II TM Distance: >3 FB Neck ROM: full    Dental  (+) Teeth Intact and Dental Advidsory Given   Pulmonary          Cardiovascular hypertension, On Home Beta Blockers + CAD     Neuro/Psych CVA, Residual Symptoms    GI/Hepatic GERD-  Poorly Controlled,  Endo/Other    Renal/GU Renal disease     Musculoskeletal   Abdominal   Peds  Hematology   Anesthesia Other Findings   Reproductive/Obstetrics                           Anesthesia Physical Anesthesia Plan  ASA: III  Anesthesia Plan: General   Post-op Pain Management:    Induction: Intravenous  Airway Management Planned: Oral ETT  Additional Equipment:   Intra-op Plan:   Post-operative Plan: Extubation in OR  Informed Consent: I have reviewed the patients History and Physical, chart, labs and discussed the procedure including the risks, benefits and alternatives for the proposed anesthesia with the patient or authorized representative who has indicated his/her understanding and acceptance.     Plan Discussed with: CRNA and Surgeon  Anesthesia Plan Comments:         Anesthesia Quick Evaluation

## 2012-10-03 NOTE — Addendum Note (Signed)
Addendum  created 10/03/12 1110 by Neldon Newport, CRNA   Modules edited:Anesthesia Flowsheet

## 2012-10-05 ENCOUNTER — Other Ambulatory Visit: Payer: Self-pay | Admitting: Physician Assistant

## 2012-10-06 NOTE — Addendum Note (Signed)
Addendum  created 10/06/12 0936 by Neldon Newport, CRNA   Modules edited:Anesthesia Responsible Staff

## 2012-10-27 ENCOUNTER — Encounter: Payer: Self-pay | Admitting: Vascular Surgery

## 2012-10-28 ENCOUNTER — Ambulatory Visit
Admission: RE | Admit: 2012-10-28 | Discharge: 2012-10-28 | Disposition: A | Discharge status: Home / Self Care | Payer: Medicare Other | Source: Ambulatory Visit | Attending: Vascular Surgery | Admitting: Vascular Surgery

## 2012-10-28 ENCOUNTER — Ambulatory Visit (INDEPENDENT_AMBULATORY_CARE_PROVIDER_SITE_OTHER): Payer: Medicare Other | Admitting: Vascular Surgery

## 2012-10-28 ENCOUNTER — Encounter: Payer: Self-pay | Admitting: Vascular Surgery

## 2012-10-28 VITALS — BP 196/86 | HR 70 | Resp 16 | Ht 67.0 in | Wt 150.1 lb

## 2012-10-28 DIAGNOSIS — Z48812 Encounter for surgical aftercare following surgery on the circulatory system: Secondary | ICD-10-CM

## 2012-10-28 DIAGNOSIS — I714 Abdominal aortic aneurysm, without rupture, unspecified: Secondary | ICD-10-CM

## 2012-10-28 MED ORDER — ATORVASTATIN CALCIUM 20 MG PO TABS: 20.0000 mg | ORAL_TABLET | Freq: Every day | ORAL | Status: DC

## 2012-10-28 MED ORDER — IOHEXOL 350 MG/ML SOLN
75.0000 mL | Freq: Once | INTRAVENOUS | Status: AC | PRN
  Administered 2012-10-28: 75 mL via INTRAVENOUS

## 2012-10-28 NOTE — Progress Notes (Signed)
The patient is here today for stent graft repair of abdominal aortic aneurysm on 09/29/2012. He had percutaneous access and both groins and had immediate complication of thrombosis of his right femoral artery and recovery room in stating back to the operating room for endarterectomy and patch. He has done well since the surgery was discharged home on the following day. He does report a diminished stamina. He is status post a coronary artery bypass grafting prior to his stent graft repair.  His the right groin his incision is well-healed and his left groin puncture site is well healed as well. Does have a slight eschar on the upper pole of the incision on the right. There is no evidence of infection or surrounding erythema. Abdominal exam reveals no pulsatile mass and no tenderness.  CT and a gram of his abdomen and pelvis today was reviewed with the patient and his daughter present. This shows excellent positioning of the stent graft with no evidence of endoleak. He has stable aneurysm sac size.  Impression and plans stable status post stent graft repair of abdominal aortic aneurysm. He will be seen again in 6 months with repeat CT scan. I he is frustrated and is a slow recovery from 2 surgeries back to back with his is coronary bypass grafting and stent graft. He was encouraged to continue his walking program. He does have stable bilateral lower extremity claudication with rest pain. He does have known chronic superficial femoral artery occlusive disease

## 2012-10-29 NOTE — Addendum Note (Signed)
Addended by: Mena Goes on: 10/29/2012 11:25 AM   Modules accepted: Orders

## 2012-11-03 ENCOUNTER — Other Ambulatory Visit: Payer: Self-pay | Admitting: Physician Assistant

## 2012-11-10 ENCOUNTER — Other Ambulatory Visit: Payer: Self-pay | Admitting: Physician Assistant

## 2012-11-12 ENCOUNTER — Other Ambulatory Visit: Payer: Self-pay | Admitting: Physician Assistant

## 2012-11-14 ENCOUNTER — Other Ambulatory Visit: Payer: Self-pay | Admitting: Physician Assistant

## 2012-12-01 ENCOUNTER — Ambulatory Visit (INDEPENDENT_AMBULATORY_CARE_PROVIDER_SITE_OTHER): Payer: Medicare Other | Admitting: Cardiovascular Disease

## 2012-12-01 ENCOUNTER — Encounter: Payer: Self-pay | Admitting: Cardiovascular Disease

## 2012-12-01 VITALS — BP 150/70 | HR 80 | Ht 67.0 in | Wt 152.0 lb

## 2012-12-01 DIAGNOSIS — I714 Abdominal aortic aneurysm, without rupture, unspecified: Secondary | ICD-10-CM

## 2012-12-01 DIAGNOSIS — I2510 Atherosclerotic heart disease of native coronary artery without angina pectoris: Secondary | ICD-10-CM

## 2012-12-01 DIAGNOSIS — I10 Essential (primary) hypertension: Secondary | ICD-10-CM

## 2012-12-01 LAB — BASIC METABOLIC PANEL
Calcium: 9.1 mg/dL (ref 8.4–10.5)
GFR: 32.74 mL/min — ABNORMAL LOW (ref >60.00)
Glucose, Bld: 114 mg/dL — ABNORMAL HIGH (ref 70–99)
Sodium: 139 mEq/L (ref 135–145)

## 2012-12-01 MED ORDER — ATORVASTATIN CALCIUM 20 MG PO TABS: 20.0000 mg | ORAL_TABLET | Freq: Every day | ORAL | Status: DC

## 2012-12-01 NOTE — Progress Notes (Signed)
Marc [name] Date of Birth  Jul 22, 1930       [facility]    [employer] [address], [address], [place], [place]  [address]   [place], [place]  [address] [phone]     [phone]   Fax  [phone]    Fax [phone]  Problem List: 1. Abdominal aortic aneurysm 2. Coronary artery disease-status post coronary artery bypass grafting . History of Present Illness:  Marc [name] is seen today for followup visit. He presented originally for preoperative evaluation of an abdominal aortic aneurysm. The stress test was abnormal. Subsequent cath revealed significant three-vessel coronary artery disease and he had coronary artery bypass grafting. He is also had stent repair of his abdominal aortic aneurysm.  December 02, 2011: He seems to be making slow and steady improvement. He denies any chest pain or dyspnea. He still eats a fair amount of salt. He eats cancer and also eats sausage every morning.  Current Outpatient Prescriptions on File Prior to Visit  Medication Sig Dispense Refill  . aspirin EC 81 MG tablet Take 81 mg by mouth daily.      Marland Kitchen atorvastatin (LIPITOR) 20 MG tablet Take 1 tablet (20 mg total) by mouth daily at 6 PM.  30 tablet  1  . ibuprofen (ADVIL,MOTRIN) 200 MG tablet Take 400 mg by mouth every 6 (six) hours as needed. For pain      . metoprolol tartrate (LOPRESSOR) 25 MG tablet TAKE ONE-HALF TABLET BY MOUTH TWICE DAILY  30 tablet  0  . Multiple Vitamin (MULTIVITAMIN WITH MINERALS) TABS Take 1 tablet by mouth daily.      . ranitidine (ZANTAC) 150 MG capsule Take 150 mg by mouth 2 (two) times daily as needed. For acid reflux        No Known Allergies  Past Medical History  Diagnosis Date  . AAA (abdominal aortic aneurysm)   . Hypertension   . Hyperlipidemia   . GERD (gastroesophageal reflux disease)   . Arthritis   . Stroke 2012    affecting RUE  . History of ulcerative colitis   . History of Clostridium  difficile infection   . Gall stones   . Coronary artery disease     s/p CABG Dr. Servando Snare, Dr. Acie Fredrickson  . Kidney stone on left side     has not passed as of 09/23/12    Past Surgical History  Procedure Date  . Appendectomy 07-2009    Ruptured appendix  . Fracture surgery     rt lower leg injured when hit by ca  . Cardiac catheterization   . Coronary artery bypass graft 08/05/2012    Procedure: CORONARY ARTERY BYPASS GRAFTING (CABG);  Surgeon: Grace Isaac, MD;  Location: [place];  Service: Open Heart Surgery;  Laterality: N/A;  . Cardiovascular stress test 07/17/12  . Abdominal aortic endovascular stent graft 09/29/2012    Procedure: ABDOMINAL AORTIC ENDOVASCULAR STENT GRAFT;  Surgeon: Rosetta Posner, MD;  Location: [place];  Service: Vascular;  Laterality: N/A;  GORE; ultrasound guided.  . Femoral artery exploration 09/29/2012    Procedure: FEMORAL ARTERY EXPLORATION;  Surgeon: Rosetta Posner, MD;  Location: [place];  Service: Vascular;  Laterality: Right;  . Patch angioplasty 09/29/2012    Procedure: PATCH ANGIOPLASTY;  Surgeon: Rosetta Posner, MD;  Location: [place];  Service: Vascular;  Laterality: Right;  . Endarterectomy femoral 09/29/2012    Procedure: ENDARTERECTOMY FEMORAL;  Surgeon: Sherren Mocha  Katina Dung, MD;  Location: [place];  Service: Vascular;  Laterality: Right;    History  Smoking status  . Former Smoker -- 1.0 packs/day for 68 years  . Types: Cigarettes  . Quit date: 08/04/2012  Smokeless tobacco  . Never Used    History  Alcohol Use No   is now retired. He used to work in a Writer at [employer].  Family History  Problem Relation Age of Onset  . Stroke Mother     Reviw of Systems:  Reviewed in the HPI.  All other systems are negative.  Physical Exam: Blood pressure 150/70, pulse 80, height 5\' 7"  (1.702 m), weight 152 lb (68.947 kg). General: Well developed, well nourished, in no acute distress.  Head: Normocephalic, atraumatic, sclera non-icteric, mucus  membranes are moist,   Neck: Supple. Carotids are 2 + without bruits. No JVD  Lungs: Clear bilaterally to auscultation.  Heart: regular rate.  normal  S1 S2. No murmurs, gallops or rubs.  His sternotomy is healing well.  Abdomen: Soft, non-tender, non-distended with normal bowel sounds. No hepatomegaly. No rebound/guarding. No masses.  Msk:  Strength and tone are normal  Extremities: No clubbing or cyanosis. No edema.  Distal pedal pulses are 2+ and equal bilaterally.  Neuro: Alert and oriented X 3. Moves all extremities spontaneously.  Psych:  Responds to questions appropriately with a normal affect.  ECG: 07/10/2012-normal sinus rhythm at 80 beats a minute. Has left axis deviation.  Assessment / Plan:

## 2012-12-01 NOTE — Assessment & Plan Note (Signed)
Marc [name]  is doing well. He still having some problems with his poor appetite. I told him that this should improve over the next several months. I've asked him to exercise on a regular basis.  His sternotomy appears to be healing well.

## 2012-12-01 NOTE — Patient Instructions (Addendum)
Your physician recommends that you return for lab work in: today and in 3 months   Your physician recommends that you schedule a follow-up appointment in: 3 months   Your physician recommends that you continue on your current medications as directed. Please refer to the Current Medication list given to you today.   REDUCE HIGH SODIUM FOODS LIKE CANNED SOUP, GRAVY, SAUCES, READY PREPARED FOODS LIKE FROZEN FOODS; LEAN CUISINE, LASAGNA. BACON, SAUSAGE, LUNCH MEAT, FAST FOODS.Marland Kitchen   DASH Diet The DASH diet stands for "Dietary Approaches to Stop Hypertension." It is a healthy eating plan that has been shown to reduce high blood pressure (hypertension) in as little as 14 days, while also possibly providing other significant health benefits. These other health benefits include reducing the risk of breast cancer after menopause and reducing the risk of type 2 diabetes, heart disease, colon cancer, and stroke. Health benefits also include weight loss and slowing kidney failure in patients with chronic kidney disease.  DIET GUIDELINES  Limit salt (sodium). Your diet should contain less than 1500 mg of sodium daily.  Limit refined or processed carbohydrates. Your diet should include mostly whole grains. Desserts and added sugars should be used sparingly.  Include small amounts of heart-healthy fats. These types of fats include nuts, oils, and tub margarine. Limit saturated and trans fats. These fats have been shown to be harmful in the body. CHOOSING FOODS  The following food groups are based on a 2000 calorie diet. See your Registered Dietitian for individual calorie needs. Grains and Grain Products (6 to 8 servings daily)  Eat More Often: Whole-wheat bread, brown rice, whole-grain or wheat pasta, quinoa, popcorn without added fat or salt (air popped).  Eat Less Often: White bread, white pasta, white rice, cornbread. Vegetables (4 to 5 servings daily)  Eat More Often: Fresh, frozen, and canned  vegetables. Vegetables may be raw, steamed, roasted, or grilled with a minimal amount of fat.  Eat Less Often/Avoid: Creamed or fried vegetables. Vegetables in a cheese sauce. Fruit (4 to 5 servings daily)  Eat More Often: All fresh, canned (in natural juice), or frozen fruits. Dried fruits without added sugar. One hundred percent fruit juice ( cup [237 mL] daily).  Eat Less Often: Dried fruits with added sugar. Canned fruit in light or heavy syrup. [employer], Fish, and Poultry (2 servings or less daily. One serving is 3 to 4 oz [85-114 g]).  Eat More Often: Ninety percent or leaner ground beef, tenderloin, sirloin. Round cuts of beef, chicken breast, [place] breast. All fish. Grill, bake, or broil your meat. Nothing should be fried.  Eat Less Often/Avoid: Fatty cuts of meat, [place], or chicken leg, thigh, or wing. Fried cuts of meat or fish. Dairy (2 to 3 servings)  Eat More Often: Low-fat or fat-free milk, low-fat plain or light yogurt, reduced-fat or part-skim cheese.  Eat Less Often/Avoid: Milk (whole, 2%).Whole milk yogurt. Full-fat cheeses. Nuts, Seeds, and Legumes (4 to 5 servings per week)  Eat More Often: All without added salt.  Eat Less Often/Avoid: Salted nuts and seeds, canned beans with added salt. Fats and Sweets (limited)  Eat More Often: Vegetable oils, tub margarines without trans fats, sugar-free gelatin. Mayonnaise and salad dressings.  Eat Less Often/Avoid: Coconut oils, palm oils, butter, stick margarine, cream, half and half, cookies, candy, pie. FOR MORE INFORMATION The Dash Diet Eating Plan: www.dashdiet.org Document Released: 11/01/2011 Document Revised: 02/04/2012 Document Reviewed: 11/01/2011 [facility] Patient Information 2013 [place].

## 2012-12-01 NOTE — Assessment & Plan Note (Signed)
His blood pressure is a little bit elevated today. It is clear that he still eating quite a bit of salty foods. I've advised him to cut back on his salt intake. I seen again in several months for followup visit.

## 2013-02-20 ENCOUNTER — Ambulatory Visit (INDEPENDENT_AMBULATORY_CARE_PROVIDER_SITE_OTHER): Payer: Medicare Other | Admitting: Cardiovascular Disease

## 2013-02-20 ENCOUNTER — Encounter: Payer: Self-pay | Admitting: Cardiovascular Disease

## 2013-02-20 VITALS — BP 128/84 | HR 60 | Ht 67.0 in | Wt 152.0 lb

## 2013-02-20 DIAGNOSIS — I2510 Atherosclerotic heart disease of native coronary artery without angina pectoris: Secondary | ICD-10-CM

## 2013-02-20 DIAGNOSIS — I714 Abdominal aortic aneurysm, without rupture, unspecified: Secondary | ICD-10-CM

## 2013-02-20 DIAGNOSIS — I10 Essential (primary) hypertension: Secondary | ICD-10-CM

## 2013-02-20 DIAGNOSIS — N189 Chronic kidney disease, unspecified: Secondary | ICD-10-CM

## 2013-02-20 DIAGNOSIS — N289 Disorder of kidney and ureter, unspecified: Secondary | ICD-10-CM

## 2013-02-20 LAB — BASIC METABOLIC PANEL
Chloride: 106 mEq/L (ref 96–112)
Creatinine, Ser: 2.5 mg/dL — ABNORMAL HIGH (ref 0.4–1.5)
Sodium: 139 mEq/L (ref 135–145)

## 2013-02-20 NOTE — Assessment & Plan Note (Signed)
He status post coronary artery bypass grafting. We'll continue with the same medications.
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
## 2013-02-20 NOTE — Assessment & Plan Note (Signed)
During his last basic metabolic profile, he was noted to have a creatinine of 2.1. The previous creatinine was 1.5. I suspect that this was due to Motrin as well as poor by mouth intake. We have stopped his Motrin and we encouraged him to drink more water. We'll recheck basic metabolic profile today. I've asked him to talk to Dr. Arelia Sneddon about this.

## 2013-02-20 NOTE — Assessment & Plan Note (Signed)
S/p stent graft repair.  Doing well.

## 2013-02-20 NOTE — Patient Instructions (Addendum)
Your physician wants you to follow-up in: 6 months You will receive a reminder letter in the mail two months in advance. If you don't receive a letter, please call our office to schedule the follow-up appointment.  Your physician recommends that you return for lab work in: today  bmet  Your physician recommends that you return for a FASTING lipid profile: 6 months   Your physician recommends that you continue on your current medications as directed. Please refer to the Current Medication list given to you today.

## 2013-02-26 ENCOUNTER — Telehealth: Payer: Self-pay | Admitting: *Deleted

## 2013-02-26 DIAGNOSIS — N289 Disorder of kidney and ureter, unspecified: Secondary | ICD-10-CM

## 2013-02-26 NOTE — Telephone Encounter (Signed)
Spoke with daughter at home Marc [name] nd informed her that they will get a call today or tomorrow to set up appointments, results reviewed and she was accepting of plan.

## 2013-02-26 NOTE — Telephone Encounter (Signed)
Message copied by Jonathon Jordan on Thu Feb 26, 2013  3:19 PM ------      Message from: Thayer Headings      Created: Fri Feb 20, 2013  5:38 PM       His creatinine continues to go up. We have discontinued his Motrin. I'm worried that he may have some vascular problem with his renal arteries after getting his abdominal aortic aneurysm stent graft. We should schedule a renal artery duplex scan at [facility] since they're the ones who put in the abdominal aortic stent graft.            Please forward these labs to his medical Dr.      Harley Alto should also consider a nephrology consultation. ------

## 2013-02-26 NOTE — Telephone Encounter (Signed)
noted 

## 2013-02-26 NOTE — Telephone Encounter (Signed)
Staff msg sent to schedule.

## 2013-02-26 NOTE — Telephone Encounter (Signed)
Message copied by Jonathon Jordan on Thu Feb 26, 2013  4:31 PM ------      Message from: Mack Guise B      Created: Thu Feb 26, 2013  4:06 PM      Regarding: Nephrology        Info fax to nephrology/  And [facility]  Will call to schedule Renal.  They want to do it the same day as appt with Early  6/3.       ----- Message -----         From: Jonathon Jordan, RN         Sent: 02/26/2013   3:23 PM           To: Jonathon Jordan, RN, [facility]            Please call daughter at patients home number to schedule test and referral  for him, thank you , jodette       ------

## 2013-02-27 ENCOUNTER — Telehealth: Payer: Self-pay | Admitting: Vascular Surgery

## 2013-02-27 NOTE — Telephone Encounter (Signed)
Ivin Booty @ [facility] called on 02/26/13 requesting a renal artery [place] for this patient/epic order at Dr.Nishan's request. The patient has had elevated creatinine levels. Per my conversation with Dr.Early it's ok to schedule this study and if the pt's labs are still elevated closer to his appt on 04/28/13 for a CTA then we should change order to CT w/out contrast. I spoke w/ the pt's daughter to schedule the renal artery scan on 04/24/13 at 10am. She knows her father needs to be fasting for this study. She is also aware of his appts on 04/28/13.awt

## 2013-04-23 ENCOUNTER — Telehealth: Payer: Self-pay | Admitting: *Deleted

## 2013-04-23 NOTE — Telephone Encounter (Signed)
Message copied by Jonathon Jordan on Thu Apr 23, 2013  4:40 PM ------      Message from: Mack Guise B      Created: Mon Mar 02, 2013 10:44 AM      Regarding: RE: Nephrology        Schedule for 04-24-13 @ 10:00 @ [facility] for renal       ----- Message -----         From: Armando Gang         Sent: 02/26/2013   4:06 PM           To: Armando Gang, Jonathon Jordan, RN      Subject: Nephrology                                               Info fax to nephrology/  And [facility]  Will call to schedule Renal.  They want to do it the same day as appt with Early  6/3.       ----- Message -----         From: Jonathon Jordan, RN         Sent: 02/26/2013   3:23 PM           To: Jonathon Jordan, RN, [facility]            Please call daughter at patients home number to schedule test and referral  for him, thank you , jodette             ------

## 2013-04-23 NOTE — Telephone Encounter (Signed)
noted 

## 2013-04-24 ENCOUNTER — Other Ambulatory Visit: Payer: Medicare Other

## 2013-04-27 ENCOUNTER — Telehealth: Payer: Self-pay | Admitting: *Deleted

## 2013-04-27 ENCOUNTER — Other Ambulatory Visit: Payer: Self-pay | Admitting: *Deleted

## 2013-04-27 ENCOUNTER — Encounter: Payer: Self-pay | Admitting: Vascular Surgery

## 2013-04-27 DIAGNOSIS — Z48812 Encounter for surgical aftercare following surgery on the circulatory system: Secondary | ICD-10-CM

## 2013-04-27 DIAGNOSIS — I714 Abdominal aortic aneurysm, without rupture, unspecified: Secondary | ICD-10-CM

## 2013-04-27 NOTE — Telephone Encounter (Signed)
Mr. Retana daughter, Signa Kell, called to report his creatinine levels were elevated per his nephrologist. Per Dr. Luther Parody note of 02-27-13, I changed his CTA to a CT abdomen/pelvis on 04-28-13. Hinton Dyer will check to see if insurance needs to be notified of this CPT change.  This CT is to followup on EVAR.

## 2013-04-28 ENCOUNTER — Encounter: Payer: Self-pay | Admitting: Vascular Surgery

## 2013-04-28 ENCOUNTER — Inpatient Hospital Stay: Admission: RE | Admit: 2013-04-28 | Payer: Medicare Other | Source: Ambulatory Visit

## 2013-04-28 ENCOUNTER — Ambulatory Visit (INDEPENDENT_AMBULATORY_CARE_PROVIDER_SITE_OTHER): Payer: Medicare Other | Admitting: Vascular Surgery

## 2013-04-28 ENCOUNTER — Ambulatory Visit
Admission: RE | Admit: 2013-04-28 | Discharge: 2013-04-28 | Disposition: A | Discharge status: Home / Self Care | Payer: Medicare Other | Source: Ambulatory Visit | Attending: Vascular Surgery | Admitting: Vascular Surgery

## 2013-04-28 ENCOUNTER — Ambulatory Visit: Payer: Medicare Other | Admitting: Vascular Surgery

## 2013-04-28 ENCOUNTER — Other Ambulatory Visit: Payer: Medicare Other

## 2013-04-28 VITALS — BP 164/77 | HR 56 | Resp 16 | Ht 67.5 in | Wt 155.8 lb

## 2013-04-28 DIAGNOSIS — Z48812 Encounter for surgical aftercare following surgery on the circulatory system: Secondary | ICD-10-CM

## 2013-04-28 DIAGNOSIS — I714 Abdominal aortic aneurysm, without rupture, unspecified: Secondary | ICD-10-CM
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
## 2013-05-06 ENCOUNTER — Encounter: Payer: Self-pay | Admitting: Nephrology

## 2013-05-12 ENCOUNTER — Ambulatory Visit: Payer: Medicare Other | Admitting: Vascular Surgery

## 2013-05-18 ENCOUNTER — Encounter: Payer: Self-pay | Admitting: Nephrology

## 2013-08-06 ENCOUNTER — Other Ambulatory Visit: Payer: Self-pay | Admitting: *Deleted

## 2013-08-06 DIAGNOSIS — I714 Abdominal aortic aneurysm, without rupture, unspecified: Secondary | ICD-10-CM

## 2013-08-06 DIAGNOSIS — I10 Essential (primary) hypertension: Secondary | ICD-10-CM

## 2013-08-06 MED ORDER — ATORVASTATIN CALCIUM 20 MG PO TABS: 20.0000 mg | ORAL_TABLET | Freq: Every day | ORAL | Status: DC

## 2013-08-19 ENCOUNTER — Ambulatory Visit (INDEPENDENT_AMBULATORY_CARE_PROVIDER_SITE_OTHER): Payer: Medicare Other | Admitting: Cardiovascular Disease

## 2013-08-19 ENCOUNTER — Encounter: Payer: Self-pay | Admitting: Cardiovascular Disease

## 2013-08-19 VITALS — BP 132/60 | HR 58 | Ht 67.5 in | Wt 161.6 lb

## 2013-08-19 DIAGNOSIS — I10 Essential (primary) hypertension: Secondary | ICD-10-CM

## 2013-08-19 DIAGNOSIS — E785 Hyperlipidemia, unspecified: Secondary | ICD-10-CM

## 2013-08-19 DIAGNOSIS — I2510 Atherosclerotic heart disease of native coronary artery without angina pectoris: Secondary | ICD-10-CM

## 2013-08-19 DIAGNOSIS — I714 Abdominal aortic aneurysm, without rupture: Secondary | ICD-10-CM

## 2013-08-19 LAB — HEPATIC FUNCTION PANEL
AST: 17 U/L (ref 0–37)
Albumin: 3.9 g/dL (ref 3.5–5.2)
Alkaline Phosphatase: 94 U/L (ref 39–117)
Total Bilirubin: 0.4 mg/dL (ref 0.3–1.2)

## 2013-08-19 LAB — BASIC METABOLIC PANEL
Chloride: 107 mEq/L (ref 96–112)
GFR: 30.96 mL/min — ABNORMAL LOW (ref >60.00)
Glucose, Bld: 96 mg/dL (ref 70–99)
Potassium: 4.7 mEq/L (ref 3.5–5.1)
Sodium: 138 mEq/L (ref 135–145)

## 2013-08-19 LAB — LIPID PANEL
LDL Cholesterol: 35 mg/dL (ref 0–99)
VLDL: 29.6 mg/dL (ref 0.0–40.0)

## 2013-08-19 NOTE — Assessment & Plan Note (Signed)
He continues to eat a very high salt. I've advised him multiple times to cut back on his salt. I've told him that do not think it will be successful in controlling his blood pressure less he decreases his salt intake.  I have also recommended that he exercise.
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
## 2013-08-19 NOTE — Patient Instructions (Addendum)
Your physician recommends that you return for lab work in: BMET [place] wants you to follow-up in: [place] will receive a reminder letter in the mail two months in advance. If you don't receive a letter, please call our office to schedule the follow-up appointment.

## 2013-08-19 NOTE — Assessment & Plan Note (Signed)
S/p stent graft repair.    I have advised him to stop smoking which will greatly reduce the risk of having further peripheral vascular problems.

## 2013-08-19 NOTE — Assessment & Plan Note (Signed)
Mr. Hoxit continues to need high fat and high salt diet. He continues to smoke. He does not exercise. He does take his medicine fairly well. I've advised him multiple times that he needs to eat better, exercise, and stop smoking. I told him that I did not think I was really helping him much at all. He would like to comment continue seeing me. I'll see him in 6 months. We'll check lipids today again in 6 months.

## 2013-10-26 ENCOUNTER — Encounter: Payer: Self-pay | Admitting: Vascular Surgery

## 2013-10-27 ENCOUNTER — Other Ambulatory Visit: Payer: Medicare Other

## 2013-10-27 ENCOUNTER — Ambulatory Visit: Payer: Medicare Other | Admitting: Vascular Surgery

## 2013-11-02 ENCOUNTER — Encounter: Payer: Self-pay | Admitting: Vascular Surgery

## 2013-11-03 ENCOUNTER — Ambulatory Visit (INDEPENDENT_AMBULATORY_CARE_PROVIDER_SITE_OTHER): Payer: Medicare Other | Admitting: Vascular Surgery

## 2013-11-03 ENCOUNTER — Ambulatory Visit
Admission: RE | Admit: 2013-11-03 | Discharge: 2013-11-03 | Disposition: A | Discharge status: Home / Self Care | Payer: Medicare Other | Source: Ambulatory Visit | Attending: Vascular Surgery | Admitting: Vascular Surgery

## 2013-11-03 ENCOUNTER — Encounter: Payer: Self-pay | Admitting: Vascular Surgery

## 2013-11-03 VITALS — BP 165/85 | HR 54 | Resp 16 | Ht 67.5 in | Wt 160.0 lb

## 2013-11-03 DIAGNOSIS — L609 Nail disorder, unspecified: Secondary | ICD-10-CM

## 2013-11-03 DIAGNOSIS — Z48812 Encounter for surgical aftercare following surgery on the circulatory system: Secondary | ICD-10-CM

## 2013-11-03 DIAGNOSIS — I739 Peripheral vascular disease, unspecified: Secondary | ICD-10-CM

## 2013-11-03 DIAGNOSIS — I714 Abdominal aortic aneurysm, without rupture, unspecified: Secondary | ICD-10-CM

## 2013-11-03 DIAGNOSIS — L608 Other nail disorders: Secondary | ICD-10-CM

## 2013-11-03 NOTE — Progress Notes (Signed)
Presents today for followup of stent graft repair of infrarenal abdominal aortic aneurysm on a November 2013. He is here today with his daughter. He continues to be stable condition. He does have chronic discomfort in both extremities related to neuropathy and probably also related to some peripheral vascular occlusive disease. He also has gout He is able to walk and do his usual activities with some calf claudication as well. He reports no new major medical difficulties.  Past Medical History  Diagnosis Date  . AAA (abdominal aortic aneurysm)   . Hypertension   . Hyperlipidemia   . GERD (gastroesophageal reflux disease)   . Arthritis   . Stroke 2012    affecting RUE  . History of ulcerative colitis   . History of Clostridium difficile infection   . Gall stones   . Coronary artery disease     s/p CABG Dr. Servando Snare, Dr. Acie Fredrickson  . Kidney stone on left side     has not passed as of 09/23/12    History  Substance Use Topics  . Smoking status: Former Smoker -- 1.00 packs/day for 68 years    Types: Cigarettes    Quit date: 08/04/2012  . Smokeless tobacco: Never Used  . Alcohol Use: No    Family History  Problem Relation Age of Onset  . Stroke Mother     No Known Allergies  Current outpatient prescriptions:acetaminophen (TYLENOL) 500 MG tablet, Take 500 mg by mouth every 8 (eight) hours as needed., Disp: , Rfl: ;  aspirin EC 81 MG tablet, Take 81 mg by mouth daily., Disp: , Rfl: ;  atorvastatin (LIPITOR) 20 MG tablet, Take 1 tablet (20 mg total) by mouth daily at 6 PM., Disp: 30 tablet, Rfl: 3;  Calcium Carbonate Antacid (TUMS PO), Take by mouth daily., Disp: , Rfl:  metoprolol (LOPRESSOR) 50 MG tablet, Take 50 mg by mouth daily., Disp: , Rfl: ;  Multiple Vitamin (MULTIVITAMIN WITH MINERALS) TABS, Take 1 tablet by mouth daily., Disp: , Rfl: ;  pseudoephedrine-acetaminophen (TYLENOL SINUS) 30-500 MG TABS, Take 1 tablet by mouth every 4 (four) hours as needed., Disp: , Rfl: ;  ranitidine  (ZANTAC) 150 MG capsule, Take 150 mg by mouth 2 (two) times daily as needed. For acid reflux, Disp: , Rfl:   BP 165/85  Pulse 54  Resp 16  Ht 5' 7.5" (1.715 m)  Wt 160 lb (72.576 kg)  BMI 24.68 kg/m2  Body mass index is 24.68 kg/(m^2).       Physical exam well-developed well-nourished gentleman in no acute distress Pulse status 2+ radial 2+ femoral pulses. He has absent popliteal and distal pulses. Abdomen is soft and nontender no masses noted and no palpable aneurysm is Respirations are equal and nonlabored rest  neurologically is grossly intact He has an extensive very long toenails and these are curling back into his toes. No inflammation associated with this  He underwent a noncontrast CT today and I do to his known renal insufficiency. I have reviewed this and discussed with the patient and his family. This does show no change in the maximal diameter of his aneurysm with good placement of the stent graft. This is noncontrast to this not possible talk about endoleak.  He also had lower lung lesion that was also visualized today and is been no change  Impression and plan: Stable followup after stent graft repair. Will be seen again in one year with repeat noncontrast CT. We will refer him to [facility] for toenail  trimming.

## 2013-11-03 NOTE — Addendum Note (Signed)
Addended by: Mena Goes on: 11/03/2013 12:04 PM   Modules accepted: Orders

## 2013-11-05 ENCOUNTER — Encounter: Payer: Self-pay | Admitting: Podiatry

## 2013-11-05 ENCOUNTER — Ambulatory Visit (INDEPENDENT_AMBULATORY_CARE_PROVIDER_SITE_OTHER): Payer: Medicare Other | Admitting: Podiatry

## 2013-11-05 VITALS — BP 133/86 | HR 94 | Resp 12 | Ht 67.0 in | Wt 160.0 lb

## 2013-11-05 DIAGNOSIS — I739 Peripheral vascular disease, unspecified: Secondary | ICD-10-CM

## 2013-11-05 DIAGNOSIS — B351 Tinea unguium: Secondary | ICD-10-CM

## 2013-11-05 DIAGNOSIS — M79609 Pain in unspecified limb: Secondary | ICD-10-CM

## 2013-11-05 NOTE — Progress Notes (Signed)
   Subjective:    Patient ID: Marc [name], male    DOB: Jan 25, 1930, 79 y.o.   MRN: [id number]  HPI Comments: '' TOENAILS TRIM''.  SOMETIMES THE TOENAILS ARE THROBBING AT NIGHT.      Review of Systems  Constitutional: Positive for activity change.  HENT: Positive for hearing loss, sinus pressure and trouble swallowing.   Eyes: Positive for visual disturbance.  All other systems reviewed and are negative.       Objective:   Physical Exam:. I have reviewed his past medical history medications allergies surgeries social history. Vital signs are stable he is alert and oriented. Vascular evaluation demonstrates nonpalpable pulses bilateral. Capillary fill time to digits one through 5 of the bilateral foot is sluggish. Decrease in sensorium per since once the monofilament bilateral toes. Deep tendon reflexes are in non-elicitable. Muscle strength 4/5 dorsiflexors plantar flexors inverters everters all intrinsic musculature is intact orthopedic evaluation demonstrates mild frontal hammertoe deformities noted bilateral mild foot deformity. Nails are thick yellow dystrophic onychomycotic otherwise no open wounds no erythema associated edema cellulitis drainage or odor.        Assessment & Plan:  Assessment: Peripheral vascular disease with pain in limb secondary to onychomycosis 1 through 5 bilateral.  Plan: Debridement of nails 1 through 5 bilateral.

## 2013-12-11 ENCOUNTER — Other Ambulatory Visit: Payer: Self-pay | Admitting: Cardiovascular Disease

## 2013-12-17 ENCOUNTER — Other Ambulatory Visit: Payer: Self-pay

## 2013-12-17 MED ORDER — METOPROLOL TARTRATE 50 MG PO TABS: 50.0000 mg | ORAL_TABLET | Freq: Every day | ORAL | Status: DC

## 2014-01-21 ENCOUNTER — Ambulatory Visit: Payer: Medicare Other | Admitting: Podiatry

## 2014-01-22 ENCOUNTER — Other Ambulatory Visit (HOSPITAL_COMMUNITY): Payer: Self-pay | Admitting: Family Medicine

## 2014-01-22 DIAGNOSIS — R1310 Dysphagia, unspecified: Secondary | ICD-10-CM

## 2014-01-27 ENCOUNTER — Ambulatory Visit (HOSPITAL_COMMUNITY): Payer: Medicare Other

## 2014-02-05 ENCOUNTER — Ambulatory Visit: Payer: Medicare Other | Admitting: Podiatry

## 2014-02-16 ENCOUNTER — Ambulatory Visit: Payer: Medicare Other | Admitting: Podiatry

## 2014-03-02 ENCOUNTER — Ambulatory Visit: Payer: Medicare Other | Admitting: Podiatry

## 2014-03-05 ENCOUNTER — Ambulatory Visit (INDEPENDENT_AMBULATORY_CARE_PROVIDER_SITE_OTHER): Payer: Medicare Other | Admitting: Podiatry

## 2014-03-05 ENCOUNTER — Encounter: Payer: Self-pay | Admitting: Podiatry

## 2014-03-05 VITALS — BP 133/70 | HR 60 | Resp 12

## 2014-03-05 DIAGNOSIS — B351 Tinea unguium: Secondary | ICD-10-CM

## 2014-03-05 DIAGNOSIS — M79609 Pain in unspecified limb: Secondary | ICD-10-CM

## 2014-03-05 NOTE — Progress Notes (Signed)
He presents today with a chief complaint of painful elongated toenails.  Objective: Nails are thick yellow dystrophic onychomycotic and painful palpation.  Assessment: Pain in limb secondary to onychomycosis 1 through 5 bilateral.  Plan: Debridement nails in thickness as cover service secondary to pain.

## 2014-06-15 ENCOUNTER — Ambulatory Visit (INDEPENDENT_AMBULATORY_CARE_PROVIDER_SITE_OTHER): Payer: Medicare Other | Admitting: Podiatry

## 2014-06-15 DIAGNOSIS — B351 Tinea unguium: Secondary | ICD-10-CM

## 2014-06-15 DIAGNOSIS — M79676 Pain in unspecified toe(s): Secondary | ICD-10-CM

## 2014-06-15 DIAGNOSIS — M79609 Pain in unspecified limb: Secondary | ICD-10-CM

## 2014-06-15 NOTE — Progress Notes (Signed)
He presents today chief complaint of painful elongated toenails.  Objective: Pulses are palpable bilateral. Nails are thick yellow dystrophic onychomycotic and painful palpation.  Assessment: Pain in limb secondary to onychomycosis 1 through 5 bilateral.  Plan: Debridement of nails 1 through 5 bilateral covered service secondary to pain.

## 2014-07-01 ENCOUNTER — Telehealth: Payer: Self-pay

## 2014-07-01 DIAGNOSIS — I70219 Atherosclerosis of native arteries of extremities with intermittent claudication, unspecified extremity: Secondary | ICD-10-CM

## 2014-07-01 DIAGNOSIS — M79671 Pain in right foot: Secondary | ICD-10-CM

## 2014-07-01 NOTE — Telephone Encounter (Signed)
Phone call from pt's daughter.  Stated the patient has had increased pain in the right foot @ rest.  Also stated he has "stinging" pain in the toes that has moved up into the foot.  Reported pt. Is having increased difficulty walking, due to pain in legs with activity.  Denies any open sores at this time.  Requesting appt. To evaluate blood flow in legs.  Advised will schedule appt. and contact the daughter.  Encouraged to call sooner if symptoms worsen.  Verb. Understanding.

## 2014-07-02 NOTE — Telephone Encounter (Signed)
Spoke with patients daughter, [name]. She is aware of wait time in between appointments, dpm

## 2014-07-05 ENCOUNTER — Encounter: Payer: Self-pay | Admitting: Family

## 2014-07-06 ENCOUNTER — Encounter: Payer: Self-pay | Admitting: Family

## 2014-07-06 ENCOUNTER — Ambulatory Visit (INDEPENDENT_AMBULATORY_CARE_PROVIDER_SITE_OTHER): Payer: Medicare Other | Admitting: Family

## 2014-07-06 ENCOUNTER — Ambulatory Visit (HOSPITAL_COMMUNITY)
Admission: RE | Admit: 2014-07-06 | Discharge: 2014-07-06 | Disposition: A | Discharge status: Home / Self Care | Payer: Medicare Other | Source: Ambulatory Visit | Attending: Family | Admitting: Family

## 2014-07-06 VITALS — BP 149/73 | HR 57 | Resp 16 | Ht 67.0 in | Wt 171.0 lb

## 2014-07-06 DIAGNOSIS — I70219 Atherosclerosis of native arteries of extremities with intermittent claudication, unspecified extremity: Secondary | ICD-10-CM | POA: Insufficient documentation

## 2014-07-06 DIAGNOSIS — M79606 Pain in leg, unspecified: Secondary | ICD-10-CM

## 2014-07-06 DIAGNOSIS — R29898 Other symptoms and signs involving the musculoskeletal system: Secondary | ICD-10-CM | POA: Insufficient documentation

## 2014-07-06 DIAGNOSIS — M79609 Pain in unspecified limb: Secondary | ICD-10-CM | POA: Diagnosis not present

## 2014-07-06 DIAGNOSIS — R200 Anesthesia of skin: Secondary | ICD-10-CM | POA: Insufficient documentation

## 2014-07-06 DIAGNOSIS — R209 Unspecified disturbances of skin sensation: Secondary | ICD-10-CM

## 2014-07-06 DIAGNOSIS — M79671 Pain in right foot: Secondary | ICD-10-CM
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
## 2014-07-06 NOTE — Patient Instructions (Addendum)
Abdominal Aortic Aneurysm An aneurysm is a weakened or damaged part of an artery wall that bulges from the normal force of blood pumping through the body. An abdominal aortic aneurysm is an aneurysm that occurs in the lower part of the aorta, the main artery of the body.  The major concern with an abdominal aortic aneurysm is that it can enlarge and burst (rupture) or blood can flow between the layers of the wall of the aorta through a tear (aorticdissection). Both of these conditions can cause bleeding inside the body and can be life threatening unless diagnosed and treated promptly. CAUSES  The exact cause of an abdominal aortic aneurysm is unknown. Some contributing factors are:   A hardening of the arteries caused by the buildup of fat and other substances in the lining of a blood vessel (arteriosclerosis).  Inflammation of the walls of an artery (arteritis).   Connective tissue diseases, such as Marfan syndrome.   Abdominal trauma.   An infection, such as syphilis or staphylococcus, in the wall of the aorta (infectious aortitis) caused by bacteria. RISK FACTORS  Risk factors that contribute to an abdominal aortic aneurysm may include:  Age older than 60 years.   High blood pressure (hypertension).  Male gender.  Ethnicity (white race).  Obesity.  Family history of aneurysm (first degree relatives only).  Tobacco use. PREVENTION  The following healthy lifestyle habits may help decrease your risk of abdominal aortic aneurysm:  Quitting smoking. Smoking can raise your blood pressure and cause arteriosclerosis.  Limiting or avoiding alcohol.  Keeping your blood pressure, blood sugar level, and cholesterol levels within normal limits.  Decreasing your salt intake. In somepeople, too much salt can raise blood pressure and increase your risk of abdominal aortic aneurysm.  Eating a diet low in saturated fats and cholesterol.  Increasing your fiber intake by including  whole grains, vegetables, and fruits in your diet. Eating these foods may help lower blood pressure.  Maintaining a healthy weight.  Staying physically active and exercising regularly. SYMPTOMS  The symptoms of abdominal aortic aneurysm may vary depending on the size and rate of growth of the aneurysm.Most grow slowly and do not have any symptoms. When symptoms do occur, they may include:  Pain (abdomen, side, lower back, or groin). The pain may vary in intensity. A sudden onset of severe pain may indicate that the aneurysm has ruptured.  Feeling full after eating only small amounts of food.  Nausea or vomiting or both.  Feeling a pulsating lump in the abdomen.  Feeling faint or passing out. DIAGNOSIS  Since most unruptured abdominal aortic aneurysms have no symptoms, they are often discovered during diagnostic exams for other conditions. An aneurysm may be found during the following procedures:  Ultrasonography (A one-time screening for abdominal aortic aneurysm by ultrasonography is also recommended for all men aged 65-75 years who have ever smoked).  X-ray exams.  A computed tomography (CT).  Magnetic resonance imaging (MRI).  Angiography or arteriography. TREATMENT  Treatment of an abdominal aortic aneurysm depends on the size of your aneurysm, your age, and risk factors for rupture. Medication to control blood pressure and pain may be used to manage aneurysms smaller than 6 cm. Regular monitoring for enlargement may be recommended by your caregiver if:  The aneurysm is 3-4 cm in size (an annual ultrasonography may be recommended).  The aneurysm is 4-4.5 cm in size (an ultrasonography every 6 months may be recommended).  The aneurysm is larger than 4.5 cm in   size (your caregiver may ask that you be examined by a vascular surgeon). If your aneurysm is larger than 6 cm, surgical repair may be recommended. There are two main methods for repair of an aneurysm:   Endovascular  repair (a minimally invasive surgery). This is done most often.  Open repair. This method is used if an endovascular repair is not possible. Document Released: 08/22/2005 Document Revised: 03/09/2013 Document Reviewed: 12/12/2012 [employer] Patient Information 2015 [employer]. This information is not intended to replace advice given to you by your health care provider. Make sure you discuss any questions you have with your health care provider.   Peripheral Vascular Disease Peripheral Vascular Disease (PVD), also called Peripheral Arterial Disease (PAD), is a circulation problem caused by cholesterol (atherosclerotic plaque) deposits in the arteries. PVD commonly occurs in the lower extremities (legs) but it can occur in other areas of the body, such as your arms. The cholesterol buildup in the arteries reduces blood flow which can cause pain and other serious problems. The presence of PVD can place a person at risk for Coronary Artery Disease (CAD).  CAUSES  Causes of PVD can be many. It is usually associated with more than one risk factor such as:   High Cholesterol.  Smoking.  Diabetes.  Lack of exercise or inactivity.  High blood pressure (hypertension).  Obesity.  Family history. SYMPTOMS   When the lower extremities are affected, patients with PVD may experience:  Leg pain with exertion or physical activity. This is called INTERMITTENT CLAUDICATION. This may present as cramping or numbness with physical activity. The location of the pain is associated with the level of blockage. For example, blockage at the abdominal level (distal abdominal aorta) may result in buttock or hip pain. Lower leg arterial blockage may result in calf pain.  As PVD becomes more severe, pain can develop with less physical activity.  In people with severe PVD, leg pain may occur at rest.  Other PVD signs and symptoms:  Leg numbness or weakness.  Coldness in the affected leg or foot, especially  when compared to the other leg.  A change in leg color.  Patients with significant PVD are more prone to ulcers or sores on toes, feet or legs. These may take longer to heal or may reoccur. The ulcers or sores can become infected.  If signs and symptoms of PVD are ignored, gangrene may occur. This can result in the loss of toes or loss of an entire limb.  Not all leg pain is related to PVD. Other medical conditions can cause leg pain such as:  Blood clots (embolism) or Deep Vein Thrombosis.  Inflammation of the blood vessels (vasculitis).  Spinal stenosis. DIAGNOSIS  Diagnosis of PVD can involve several different types of tests. These can include:  Pulse Volume Recording Method (PVR). This test is simple, painless and does not involve the use of X-rays. PVR involves measuring and comparing the blood pressure in the arms and legs. An ABI (Ankle-Brachial Index) is calculated. The normal ratio of blood pressures is 1. As this number becomes smaller, it indicates more severe disease.  < 0.95 - indicates significant narrowing in one or more leg vessels.  <0.8 - there will usually be pain in the foot, leg or buttock with exercise.  <0.4 - will usually have pain in the legs at rest.  <0.25 - usually indicates limb threatening PVD.  Doppler detection of pulses in the legs. This test is painless and checks to see if   you have a pulses in your legs/feet.  A dye or contrast material (a substance that highlights the blood vessels so they show up on x-ray) may be given to help your caregiver better see the arteries for the following tests. The dye is eliminated from your body by the kidney's. Your caregiver may order blood work to check your kidney function and other laboratory values before the following tests are performed:  Magnetic Resonance Angiography (MRA). An MRA is a picture study of the blood vessels and arteries. The MRA machine uses a large magnet to produce images of the blood  vessels.  Computed Tomography Angiography (CTA). A CTA is a specialized x-ray that looks at how the blood flows in your blood vessels. An IV may be inserted into your arm so contrast dye can be injected.  Angiogram. Is a procedure that uses x-rays to look at your blood vessels. This procedure is minimally invasive, meaning a small incision (cut) is made in your groin. A small tube (catheter) is then inserted into the artery of your groin. The catheter is guided to the blood vessel or artery your caregiver wants to examine. Contrast dye is injected into the catheter. X-rays are then taken of the blood vessel or artery. After the images are obtained, the catheter is taken out. TREATMENT  Treatment of PVD involves many interventions which may include:  Lifestyle changes:  Quitting smoking.  Exercise.  Following a low fat, low cholesterol diet.  Control of diabetes.  Foot care is very important to the PVD patient. Good foot care can help prevent infection.  Medication:  Cholesterol-lowering medicine.  Blood pressure medicine.  Anti-platelet drugs.  Certain medicines may reduce symptoms of Intermittent Claudication.  Interventional/Surgical options:  Angioplasty. An Angioplasty is a procedure that inflates a balloon in the blocked artery. This opens the blocked artery to improve blood flow.  Stent Implant. A wire mesh tube (stent) is placed in the artery. The stent expands and stays in place, allowing the artery to remain open.  Peripheral Bypass Surgery. This is a surgical procedure that reroutes the blood around a blocked artery to help improve blood flow. This type of procedure may be performed if Angioplasty or stent implants are not an option. SEEK IMMEDIATE MEDICAL CARE IF:   You develop pain or numbness in your arms or legs.  Your arm or leg turns cold, becomes blue in color.  You develop redness, warmth, swelling and pain in your arms or legs. MAKE SURE YOU:    Understand these instructions.  Will watch your condition.  Will get help right away if you are not doing well or get worse. Document Released: 12/20/2004 Document Revised: 02/04/2012 Document Reviewed: 11/16/2008 [facility] Patient Information 2015 [place], [place]. This information is not intended to replace advice given to you by your health care provider. Make sure you discuss any questions you have with your health care provider.   Smoking Cessation Quitting smoking is important to your health and has many advantages. However, it is not always easy to quit since nicotine is a very addictive drug. Oftentimes, people try 3 times or more before being able to quit. This document explains the best ways for you to prepare to quit smoking. Quitting takes hard work and a lot of effort, but you can do it. ADVANTAGES OF QUITTING SMOKING  You will live longer, feel better, and live better.  Your body will feel the impact of quitting smoking almost immediately.  Within 20 minutes, blood pressure decreases.  Your pulse returns to its normal level.  After 8 hours, carbon monoxide levels in the blood return to normal. Your oxygen level increases.  After 24 hours, the chance of having a heart attack starts to decrease. Your breath, hair, and body stop smelling like smoke.  After 48 hours, damaged nerve endings begin to recover. Your sense of taste and smell improve.  After 72 hours, the body is virtually free of nicotine. Your bronchial tubes relax and breathing becomes easier.  After 2 to 12 weeks, lungs can hold more air. Exercise becomes easier and circulation improves.  The risk of having a heart attack, stroke, cancer, or lung disease is greatly reduced.  After 1 year, the risk of coronary heart disease is cut in half.  After 5 years, the risk of stroke falls to the same as a nonsmoker.  After 10 years, the risk of lung cancer is cut in half and the risk of other cancers decreases  significantly.  After 15 years, the risk of coronary heart disease drops, usually to the level of a nonsmoker.  If you are pregnant, quitting smoking will improve your chances of having a healthy baby.  The people you live with, especially any children, will be healthier.  You will have extra money to spend on things other than cigarettes. QUESTIONS TO THINK ABOUT BEFORE ATTEMPTING TO QUIT You may want to talk about your answers with your health care provider.  Why do you want to quit?  If you tried to quit in the past, what helped and what did not?  What will be the most difficult situations for you after you quit? How will you plan to handle them?  Who can help you through the tough times? Your family? Friends? A health care provider?  What pleasures do you get from smoking? What ways can you still get pleasure if you quit? Here are some questions to ask your health care provider:  How can you help me to be successful at quitting?  What medicine do you think would be best for me and how should I take it?  What should I do if I need more help?  What is smoking withdrawal like? How can I get information on withdrawal? GET READY  Set a quit date.  Change your environment by getting rid of all cigarettes, ashtrays, matches, and lighters in your home, car, or work. Do not let people smoke in your home.  Review your past attempts to quit. Think about what worked and what did not. GET SUPPORT AND ENCOURAGEMENT You have a better chance of being successful if you have help. You can get support in many ways.  Tell your family, friends, and coworkers that you are going to quit and need their support. Ask them not to smoke around you.  Get individual, group, or telephone counseling and support. Programs are available at local hospitals and health centers. Call your local health department for information about programs in your area.  Spiritual beliefs and practices may help some  smokers quit.  Download a "quit meter" on your computer to keep track of quit statistics, such as how long you have gone without smoking, cigarettes not smoked, and money saved.  Get a self-help book about quitting smoking and staying off tobacco. LEARN NEW SKILLS AND BEHAVIORS  Distract yourself from urges to smoke. Talk to someone, go for a walk, or occupy your time with a task.  Change your normal routine. Take a different route to work.   Drink tea instead of coffee. Eat breakfast in a different place.  Reduce your stress. Take a hot bath, exercise, or read a book.  Plan something enjoyable to do every day. Reward yourself for not smoking.  Explore interactive web-based programs that specialize in helping you quit. GET MEDICINE AND USE IT CORRECTLY Medicines can help you stop smoking and decrease the urge to smoke. Combining medicine with the above behavioral methods and support can greatly increase your chances of successfully quitting smoking.  Nicotine replacement therapy helps deliver nicotine to your body without the negative effects and risks of smoking. Nicotine replacement therapy includes nicotine gum, lozenges, inhalers, nasal sprays, and skin patches. Some may be available over-the-counter and others require a prescription.  Antidepressant medicine helps people abstain from smoking, but how this works is unknown. This medicine is available by prescription.  Nicotinic receptor partial agonist medicine simulates the effect of nicotine in your brain. This medicine is available by prescription. Ask your health care provider for advice about which medicines to use and how to use them based on your health history. Your health care provider will tell you what side effects to look out for if you choose to be on a medicine or therapy. Carefully read the information on the package. Do not use any other product containing nicotine while using a nicotine replacement product.  RELAPSE OR  DIFFICULT SITUATIONS Most relapses occur within the first 3 months after quitting. Do not be discouraged if you start smoking again. Remember, most people try several times before finally quitting. You may have symptoms of withdrawal because your body is used to nicotine. You may crave cigarettes, be irritable, feel very hungry, cough often, get headaches, or have difficulty concentrating. The withdrawal symptoms are only temporary. They are strongest when you first quit, but they will go away within 10-14 days. To reduce the chances of relapse, try to:  Avoid drinking alcohol. Drinking lowers your chances of successfully quitting.  Reduce the amount of caffeine you consume. Once you quit smoking, the amount of caffeine in your body increases and can give you symptoms, such as a rapid heartbeat, sweating, and anxiety.  Avoid smokers because they can make you want to smoke.  Do not let weight gain distract you. Many smokers will gain weight when they quit, usually less than 10 pounds. Eat a healthy diet and stay active. You can always lose the weight gained after you quit.  Find ways to improve your mood other than smoking. FOR MORE INFORMATION  www.smokefree.gov  Document Released: 11/06/2001 Document Revised: 03/29/2014 Document Reviewed: 02/21/2012 [facility] Patient Information 2015 [place], [place]. This information is not intended to replace advice given to you by your health care provider. Make sure you discuss any questions you have with your health care provider.  Stroke Prevention Some medical conditions and behaviors are associated with an increased chance of having a stroke. You may prevent a stroke by making healthy choices and managing medical conditions. HOW CAN I REDUCE MY RISK OF HAVING A STROKE?   Stay physically active. Get at least 30 minutes of activity on most or all days.  Do not smoke. It may also be helpful to avoid exposure to secondhand smoke.  Limit alcohol use.  Moderate alcohol use is considered to be:  No more than 2 drinks per day for men.  No more than 1 drink per day for nonpregnant women.  Eat healthy foods. This involves:  Eating 5 or more servings of fruits and vegetables  a day.  Making dietary changes that address high blood pressure (hypertension), high cholesterol, diabetes, or obesity.  Manage your cholesterol levels.  Making food choices that are high in fiber and low in saturated fat, trans fat, and cholesterol may control cholesterol levels.  Take any prescribed medicines to control cholesterol as directed by your health care provider.  Manage your diabetes.  Controlling your carbohydrate and sugar intake is recommended to manage diabetes.  Take any prescribed medicines to control diabetes as directed by your health care provider.  Control your hypertension.  Making food choices that are low in salt (sodium), saturated fat, trans fat, and cholesterol is recommended to manage hypertension.  Take any prescribed medicines to control hypertension as directed by your health care provider.  Maintain a healthy weight.  Reducing calorie intake and making food choices that are low in sodium, saturated fat, trans fat, and cholesterol are recommended to manage weight.  Stop drug abuse.  Avoid taking birth control pills.  Talk to your health care provider about the risks of taking birth control pills if you are over 31 years old, smoke, get migraines, or have ever had a blood clot.  Get evaluated for sleep disorders (sleep apnea).  Talk to your health care provider about getting a sleep evaluation if you snore a lot or have excessive sleepiness.  Take medicines only as directed by your health care provider.  For some people, aspirin or blood thinners (anticoagulants) are helpful in reducing the risk of forming abnormal blood clots that can lead to stroke. If you have the irregular heart rhythm of atrial fibrillation, you  should be on a blood thinner unless there is a good reason you cannot take them.  Understand all your medicine instructions.  Make sure that other conditions (such as anemia or atherosclerosis) are addressed. SEEK IMMEDIATE MEDICAL CARE IF:   You have sudden weakness or numbness of the face, arm, or leg, especially on one side of the body.  Your face or eyelid droops to one side.  You have sudden confusion.  You have trouble speaking (aphasia) or understanding.  You have sudden trouble seeing in one or both eyes.  You have sudden trouble walking.  You have dizziness.  You have a loss of balance or coordination.  You have a sudden, severe headache with no known cause.  You have new chest pain or an irregular heartbeat. Any of these symptoms may represent a serious problem that is an emergency. Do not wait to see if the symptoms will go away. Get medical help at once. Call your local emergency services (911 in [place] Do not drive yourself to the hospital. Document Released: 12/20/2004 Document Revised: 03/29/2014 Document Reviewed: 05/15/2013 [facility] Patient Information 2015 [place], [place]. This information is not intended to replace advice given to you by your health care provider. Make sure you discuss any questions you have with your health care provider.

## 2014-08-09 ENCOUNTER — Other Ambulatory Visit: Payer: Self-pay | Admitting: Family Medicine

## 2014-08-09 DIAGNOSIS — R1310 Dysphagia, unspecified: Secondary | ICD-10-CM

## 2014-08-16 ENCOUNTER — Ambulatory Visit
Admission: RE | Admit: 2014-08-16 | Discharge: 2014-08-16 | Disposition: A | Discharge status: Home / Self Care | Payer: Medicare Other | Source: Ambulatory Visit | Attending: Family Medicine | Admitting: Family Medicine

## 2014-08-16 ENCOUNTER — Other Ambulatory Visit: Payer: Self-pay | Admitting: Family Medicine

## 2014-08-16 DIAGNOSIS — R05 Cough: Secondary | ICD-10-CM

## 2014-08-16 DIAGNOSIS — R1310 Dysphagia, unspecified: Secondary | ICD-10-CM

## 2014-08-16 DIAGNOSIS — R059 Cough, unspecified: Secondary | ICD-10-CM

## 2014-08-16 DIAGNOSIS — R490 Dysphonia: Secondary | ICD-10-CM

## 2014-08-25 ENCOUNTER — Encounter: Payer: Self-pay | Admitting: Physician Assistant

## 2014-09-07 ENCOUNTER — Telehealth: Payer: Self-pay | Admitting: *Deleted

## 2014-09-07 ENCOUNTER — Ambulatory Visit: Payer: Medicare Other | Admitting: Physician Assistant

## 2014-09-07 NOTE — Telephone Encounter (Signed)
I called the patient's daughter Ina Homes to ask if her father did cancel the appointment on the phone tree.  She said she cancelled it for him. He said with the rehab appointments for his wife Tuesdays are not a good day.  I told her if he is having trouble swallowing, she can call for him and make him another appointment. She said they will do that. She said he is a little stubborn.  He said for now he will take smaller bites of food and cut him meat up in smaller pieces .

## 2014-09-21 ENCOUNTER — Ambulatory Visit: Payer: Medicare Other | Admitting: Podiatry

## 2014-10-28 ENCOUNTER — Ambulatory Visit (INDEPENDENT_AMBULATORY_CARE_PROVIDER_SITE_OTHER): Payer: Medicare Other | Admitting: Podiatry

## 2014-10-28 DIAGNOSIS — B351 Tinea unguium: Secondary | ICD-10-CM

## 2014-10-28 DIAGNOSIS — M79673 Pain in unspecified foot: Secondary | ICD-10-CM

## 2014-10-28 NOTE — Progress Notes (Signed)
He presents today chief complaint of painful elongated toenails.  Objective: Pulses are palpable bilateral. Nails are thick yellow dystrophic onychomycotic and painful palpation.  Assessment: Pain in limb secondary to onychomycosis 1 through 5 bilateral.  Plan: Debridement of nails 1 through 5 bilateral covered service secondary to pain.

## 2014-11-01 ENCOUNTER — Encounter: Payer: Self-pay | Admitting: Surgery

## 2014-11-02 ENCOUNTER — Encounter: Payer: Self-pay | Admitting: Vascular Surgery

## 2014-11-02 ENCOUNTER — Ambulatory Visit
Admission: RE | Admit: 2014-11-02 | Discharge: 2014-11-02 | Disposition: A | Discharge status: Home / Self Care | Payer: Medicare Other | Source: Ambulatory Visit | Attending: Vascular Surgery | Admitting: Vascular Surgery

## 2014-11-02 ENCOUNTER — Ambulatory Visit (INDEPENDENT_AMBULATORY_CARE_PROVIDER_SITE_OTHER): Payer: Medicare Other | Admitting: Vascular Surgery

## 2014-11-02 VITALS — BP 138/75 | HR 63 | Resp 18 | Ht 67.5 in | Wt 175.0 lb

## 2014-11-02 DIAGNOSIS — I714 Abdominal aortic aneurysm, without rupture, unspecified: Secondary | ICD-10-CM

## 2014-11-02 DIAGNOSIS — I739 Peripheral vascular disease, unspecified: Secondary | ICD-10-CM

## 2014-11-02 NOTE — Addendum Note (Signed)
Addended by: Mena Goes on: 11/02/2014 02:50 PM   Modules accepted: Orders

## 2014-11-02 NOTE — Progress Notes (Signed)
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 He is here today with his daughter. He continues to have multiple medical problems but nothing referable to his aneurysms. He has been on some steroid treatments for arthritic issues and has put on weight secondary to this. He does have known renal insufficiency. He underwent noncontrast CT today for follow-up of the stent graft. He does have a ulceration on the medial aspect of his left second toe. This does appear to be related to pressure from a hammer toe.  Past Medical History  Diagnosis Date  . AAA (abdominal aortic aneurysm)   . Hypertension   . Hyperlipidemia   . GERD (gastroesophageal reflux disease)   . Arthritis   . Stroke 2012    affecting RUE  . History of ulcerative colitis   . History of Clostridium difficile infection   . Gall stones   . Coronary artery disease     s/p CABG Dr. Servando Snare, Dr. Acie Fredrickson  . Kidney stone on left side     has not passed as of 09/23/12    History  Substance Use Topics  . Smoking status: Former Smoker -- 1.00 packs/day for 68 years    Types: Cigarettes    Quit date: 08/04/2012  . Smokeless tobacco: Never Used  . Alcohol Use: No    Family History  Problem Relation Age of Onset  . Stroke Mother   . Hypertension Mother   . Heart disease Sister     Aneurysm of the Brain    No Known Allergies  Current outpatient prescriptions: acetaminophen (TYLENOL) 500 MG tablet, Take 500 mg by mouth every 8 (eight) hours as needed., Disp: , Rfl: ;  aspirin EC 81 MG tablet, Take 81 mg by mouth daily., Disp: , Rfl: ;  Calcium Carbonate Antacid (TUMS PO), Take by mouth daily., Disp: , Rfl: ;  gabapentin (NEURONTIN) 300 MG capsule, , Disp: , Rfl:  metoprolol (LOPRESSOR) 50 MG tablet, Take 1 tablet (50 mg total) by mouth daily. (Patient taking differently: Take 100 mg by mouth daily. ), Disp: 30 tablet, Rfl: 6;  Multiple Vitamin (MULTIVITAMIN WITH MINERALS) TABS, Take 1 tablet  by mouth daily., Disp: , Rfl: ;  pseudoephedrine-acetaminophen (TYLENOL SINUS) 30-500 MG TABS, Take 1 tablet by mouth every 4 (four) hours as needed., Disp: , Rfl:  ranitidine (ZANTAC) 150 MG capsule, Take 150 mg by mouth 2 (two) times daily as needed. For acid reflux, Disp: , Rfl: ;  amoxicillin (AMOXIL) 500 MG capsule, , Disp: , Rfl: ;  atorvastatin (LIPITOR) 20 MG tablet, TAKE 1 TABLET BY MOUTH DAILY AT 6PM (Patient not taking: Reported on 11/02/2014), Disp: 30 tablet, Rfl: 1  BP 138/75 mmHg  Pulse 63  Resp 18  Ht 5' 7.5" (1.715 m)  Wt 175 lb (79.379 kg)  BMI 26.99 kg/m2  Body mass index is 26.99 kg/(m^2).       Physical exam alert oriented gentleman in no acute distress Abdomen soft nontender no aneurysm palpable 2+ femoral pulses bilaterally Does have a superficial ulceration over the medial aspect of the second toe at a pressure site with no surrounding erythema  CT scan today was reviewed with the patient and his daughter present. This is a noncontrast study. This shows a stable aneurysm size with no expansion and no shifting of his stent graft.  Stable stent graft repair of abdominal aortic aneurysm. We will see him in one year with ultrasound follow-up of his aneurysm  repair

## 2015-03-30 ENCOUNTER — Ambulatory Visit (HOSPITAL_BASED_OUTPATIENT_CLINIC_OR_DEPARTMENT_OTHER): Payer: Medicare Other | Attending: Family Medicine | Admitting: *Deleted

## 2015-03-30 VITALS — Ht 67.5 in | Wt 170.0 lb

## 2015-03-30 DIAGNOSIS — G4719 Other hypersomnia: Secondary | ICD-10-CM

## 2015-03-30 DIAGNOSIS — R0683 Snoring: Secondary | ICD-10-CM | POA: Diagnosis not present

## 2015-03-30 DIAGNOSIS — G4733 Obstructive sleep apnea (adult) (pediatric): Secondary | ICD-10-CM | POA: Diagnosis not present

## 2015-03-30 DIAGNOSIS — G4700 Insomnia, unspecified: Secondary | ICD-10-CM | POA: Diagnosis present

## 2015-03-30 DIAGNOSIS — G4731 Primary central sleep apnea: Secondary | ICD-10-CM | POA: Diagnosis not present

## 2015-04-16 DIAGNOSIS — G4719 Other hypersomnia: Secondary | ICD-10-CM | POA: Diagnosis not present

## 2015-04-16 NOTE — Sleep Study (Signed)
   NAME: Marc [name] DATE OF BIRTH:  May 24, 1930 MEDICAL RECORD NUMBER [id number]  LOCATION: [facility]  PHYSICIAN: YOUNG,CLINTON D  DATE OF STUDY: 03/30/2015  SLEEP STUDY TYPE: Nocturnal Polysomnogram               REFERRING PHYSICIAN: Leonard Downing, *  INDICATION FOR STUDY: Insomnia with sleep apnea  EPWORTH SLEEPINESS SCORE:   14/24 HEIGHT: 5' 7.5" (171.5 cm)  WEIGHT: 77.111 kg (170 lb)    Body mass index is 26.22 kg/(m^2).  NECK SIZE: 15.5 in.  MEDICATIONS: Charted for review  SLEEP ARCHITECTURE: Total sleep time 230 minutes with sleep efficiency 56.7%. Stage I was 22.6%, stage II 62.2%, stage III absent, REM 15.2% of total sleep time. Sleep latency 19 minutes, REM latency 113 minutes, awake after sleep onset 137 minutes, arousal index 21.7. Sleep pattern was markedly fragmented with frequent brief spontaneous awakenings throughout the night. Bedtime medication: None  RESPIRATORY DATA: Apnea hypopnea index (AHI) 31.8 per hour. 122 total events scored including 51 obstructive apneas, 46 central apneas, 10 mixed apneas, 15 hypopneas. Most events were while supine. REM AHI 46.3 per hour. Because of fragmented sleep and frequent awakenings, he did not accumulate enough sleep to meet minimum protocol requirements for split CPAP titration during the first hours of the study.  OXYGEN DATA: Moderate snoring with oxygen desaturation to a nadir of 66% and mean saturation 93.2% on room air  CARDIAC DATA: Sinus rhythm  MOVEMENT/PARASOMNIA: 145 total limb jerks counted of which 8 were associated with arousal or awakening for periodic limb movement with arousal index of 2.1 per hour. Bathroom 1  IMPRESSION/ RECOMMENDATION:   1) Sleep was markedly fragmented with frequent brief awakenings throughout the night. Patient was described as "moaning a lot" during sleep and moving feet and legs around quite a bit. In the morning the patient reported feeling he had  slept only lightly. 2) Severe obstructive and central sleep apnea/hypopnea syndrome, AHI 31.8 per hour. Most events were while supine and/or in rem with REM AHI 31.8 per hour. Moderate snoring with oxygen desaturation to a nadir of 66% and mean saturation 93.2% on room air. 3) Because of his inability to maintain sleep, he did not accumulate the required amount of time asleep in the first hours of the study to permit use of split CPAP titration protocol. The patient can return for a dedicated CPAP titration study if appropriate. It might help for a repeat study, if he were to bring a suitable sleeping pill with him.     Deneise Lever Diplomate, American Board of Sleep Medicine  ELECTRONICALLY SIGNED ON:  04/16/2015, 3:22 PM [place] PH: [phone]   FX: [phone] [place]

## 2015-04-29 ENCOUNTER — Ambulatory Visit (HOSPITAL_BASED_OUTPATIENT_CLINIC_OR_DEPARTMENT_OTHER): Payer: Medicare Other | Attending: General Surgery | Admitting: *Deleted

## 2015-04-29 VITALS — Ht 67.0 in | Wt 165.0 lb

## 2015-04-29 DIAGNOSIS — G4710 Hypersomnia, unspecified: Secondary | ICD-10-CM | POA: Diagnosis not present

## 2015-04-29 DIAGNOSIS — G4730 Sleep apnea, unspecified: Secondary | ICD-10-CM | POA: Diagnosis not present

## 2015-04-29 DIAGNOSIS — G4733 Obstructive sleep apnea (adult) (pediatric): Secondary | ICD-10-CM

## 2015-05-07 DIAGNOSIS — G4733 Obstructive sleep apnea (adult) (pediatric): Secondary | ICD-10-CM | POA: Diagnosis not present

## 2015-05-07 DIAGNOSIS — G4710 Hypersomnia, unspecified: Secondary | ICD-10-CM | POA: Diagnosis not present

## 2015-05-07 NOTE — Sleep Study (Signed)
   NAME: Marc [name] DATE OF BIRTH:  June 15, 1930 MEDICAL RECORD NUMBER [id number]  LOCATION: [facility]  PHYSICIAN: YOUNG,CLINTON D  DATE OF STUDY: 04/29/2015  SLEEP STUDY TYPE: Nocturnal Polysomnogram-CPAP titration               REFERRING PHYSICIAN: Leonard Downing, *  INDICATION FOR STUDY: Hypersomnia with sleep apnea  EPWORTH SLEEPINESS SCORE:   14/24 HEIGHT: 5\' 7"  (170.2 cm)  WEIGHT: 165 lb (74.844 kg)    Body mass index is 25.84 kg/(m^2).  NECK SIZE: 15.5 in.  MEDICATIONS: Charted for review  SLEEP ARCHITECTURE: Total sleep time 246.5 minutes with sleep efficiency 63.8%. Stage I was 9.7%, stage II 72.8%, stage III absent, REM 17.4% of total sleep time. Sleep latency 37 minutes, REM latency 75.5 minutes, awake after sleep onset 103 minutes, arousal index 17, bedtime medication: Ambien  RESPIRATORY DATA: CPAP titration protocol. CPAP was titrated to 10 CWP with insufficient improvement, AHI 75 per hour. Bilevel (BiPAP) titration was then performed to a final inspiratory pressure of 16 and expiratory pressure of 12 CWP, AHI 70.9 per hour reflecting residual central apneas. Physician review suggests much if not all of the respiratory abnormality is Cheyne- Stokes`  OXYGEN DATA: Snoring was prevented and mean oxygen saturation was 92.7% on room air  CARDIAC DATA: Sinus rhythm  MOVEMENT/PARASOMNIA: No significant movement disturbance, bathroom 1  IMPRESSION/ RECOMMENDATION:   1) CPAP titration was unsuccessful at clearing respiratory events. Bilevel titration to final inspiratory pressure 16 and expiratory pressure 12 CWP left AHI 70.9 per hour. Obstructive events were prevented. Residual events appeared to be some central apneas but also a Cheyne Stokes respiratory pattern. Suggest a home trial with bilevel PAP inspiratory 16/expiratory 12 CWP.  He wore a small F&P Simplus fullface mask with heated humidifier. Snoring was prevented and mean oxygen  saturation was 92.7% on room air. If management and patient comfort are not satisfactory he may be a candidate to return for ASV assisted ventilation titration.Marland Kitchen  2) Baseline polysomnogram on 03/30/2015 recorded AHI 31.8 per hour with body weight 170 pounds.  Deneise Lever Diplomate, American Board of Sleep Medicine  ELECTRONICALLY SIGNED ON:  05/07/2015, 9:18 AM [place] PH: [phone]   FX: [phone] [place]

## 2015-11-02 ENCOUNTER — Encounter: Payer: Self-pay | Admitting: Family

## 2015-11-08 ENCOUNTER — Other Ambulatory Visit (HOSPITAL_COMMUNITY): Payer: Medicare Other

## 2015-11-08 ENCOUNTER — Ambulatory Visit: Payer: Medicare Other | Admitting: Family

## 2015-11-30 ENCOUNTER — Ambulatory Visit (INDEPENDENT_AMBULATORY_CARE_PROVIDER_SITE_OTHER): Payer: Medicare Other | Admitting: Podiatry

## 2015-11-30 ENCOUNTER — Encounter: Payer: Self-pay | Admitting: Podiatry

## 2015-11-30 DIAGNOSIS — B351 Tinea unguium: Secondary | ICD-10-CM | POA: Diagnosis not present

## 2015-11-30 DIAGNOSIS — M79676 Pain in unspecified toe(s): Secondary | ICD-10-CM | POA: Diagnosis not present

## 2015-11-30 NOTE — Patient Instructions (Signed)
APPLY TRIPLE ANTIBIOTIC OINTMENT TO THE  BLISTER ON THE LT HEEL AND 4TH RT TOE DAILY, COVER WITH BANDAIDS UNTIL HEALED.

## 2015-12-01 NOTE — Progress Notes (Signed)
Patient ID: Marc [name], male   DOB: 12/03/1929, 82 y.o.   MRN: [id number]  Subjective: Patient presents for ongoing nail debridement. He is complaining of elongated thickened toenails which are uncomfortable walking wearing shoes and he requests toenail debridement  Objective: Orientated 3 Blister formation medial left first MPJ Dorsal distal interphalangeal joint fourth right toe is localized erythema without any open lesions HAV deformities bilaterally The toenails are elongated, brittle, discolored, deformed and tender direct palpation 6-10  Assessment: Friction blister irritation left first MPJ Local irritation dorsal fourth right toe Symptomatic onychomycoses 6-10  Plan: Debridement toenails 6-10 mechanically and electronically without any bleeding Patient advised to apply antibiotic ointment and Band-Aids to the medial first MPJ and dorsal fourth right toe daily covering with Band-Aids until healed  Reappoint at patient's request

## 2015-12-30 ENCOUNTER — Encounter: Payer: Self-pay | Admitting: Family

## 2016-01-04 ENCOUNTER — Ambulatory Visit (HOSPITAL_COMMUNITY)
Admission: RE | Admit: 2016-01-04 | Discharge: 2016-01-04 | Disposition: A | Discharge status: Home / Self Care | Payer: Medicare Other | Source: Ambulatory Visit | Attending: Family | Admitting: Family

## 2016-01-04 ENCOUNTER — Ambulatory Visit (INDEPENDENT_AMBULATORY_CARE_PROVIDER_SITE_OTHER): Payer: Medicare Other | Admitting: Family

## 2016-01-04 ENCOUNTER — Encounter: Payer: Self-pay | Admitting: Family

## 2016-01-04 VITALS — BP 150/74 | HR 53 | Temp 96.8°F | Resp 16 | Ht 67.5 in | Wt 155.0 lb

## 2016-01-04 DIAGNOSIS — I779 Disorder of arteries and arterioles, unspecified: Secondary | ICD-10-CM | POA: Diagnosis not present

## 2016-01-04 DIAGNOSIS — I714 Abdominal aortic aneurysm, without rupture, unspecified: Secondary | ICD-10-CM

## 2016-01-04 DIAGNOSIS — Z720 Tobacco use: Secondary | ICD-10-CM

## 2016-01-04 DIAGNOSIS — I10 Essential (primary) hypertension: Secondary | ICD-10-CM | POA: Diagnosis not present

## 2016-01-04 DIAGNOSIS — Z95828 Presence of other vascular implants and grafts: Secondary | ICD-10-CM | POA: Diagnosis not present

## 2016-01-04 DIAGNOSIS — F17200 Nicotine dependence, unspecified, uncomplicated: Secondary | ICD-10-CM

## 2016-01-04 DIAGNOSIS — E785 Hyperlipidemia, unspecified: Secondary | ICD-10-CM | POA: Diagnosis not present

## 2016-01-04 NOTE — Progress Notes (Signed)
Filed Vitals:   01/04/16 0841 01/04/16 0844  BP: 156/69 150/74  Pulse: 53 53  Temp: 96.8 F (36 C)   Resp: 16   Height: 5' 7.5" (1.715 m)   Weight: 155 lb (70.308 kg)   SpO2: 93%

## 2016-01-04 NOTE — Patient Instructions (Signed)
Peripheral Vascular Disease Peripheral vascular disease (PVD) is a disease of the blood vessels that are not part of your heart and brain. A simple term for PVD is poor circulation. In most cases, PVD narrows the blood vessels that carry blood from your heart to the rest of your body. This can result in a decreased supply of blood to your arms, legs, and internal organs, like your stomach or kidneys. However, it most often affects a person's lower legs and feet. There are two types of PVD.  Organic PVD. This is the more common type. It is caused by damage to the structure of blood vessels.  Functional PVD. This is caused by conditions that make blood vessels contract and tighten (spasm). Without treatment, PVD tends to get worse over time. PVD can also lead to acute ischemic limb. This is when an arm or limb suddenly has trouble getting enough blood. This is a medical emergency. CAUSES Each type of PVD has many different causes. The most common cause of PVD is buildup of a fatty material (plaque) inside of your arteries (atherosclerosis). Small amounts of plaque can break off from the walls of the blood vessels and become lodged in a smaller artery. This blocks blood flow and can cause acute ischemic limb. Other common causes of PVD include:  Blood clots that form inside of blood vessels.  Injuries to blood vessels.  Diseases that cause inflammation of blood vessels or cause blood vessel spasms.  Health behaviors and health history that increase your risk of developing PVD. RISK FACTORS  You may have a greater risk of PVD if you:  Have a family history of PVD.  Have certain medical conditions, including:  High cholesterol.  Diabetes.  High blood pressure (hypertension).  Coronary heart disease.  Past problems with blood clots.  Past injury, such as burns or a broken bone. These may have damaged blood vessels in your limbs.  Buerger disease. This is caused by inflamed blood  vessels in your hands and feet.  Some forms of arthritis.  Rare birth defects that affect the arteries in your legs.  Use tobacco.  Do not get enough exercise.  Are obese.  Are age 50 or older. SIGNS AND SYMPTOMS  PVD may cause many different symptoms. Your symptoms depend on what part of your body is not getting enough blood. Some common signs and symptoms include:  Cramps in your lower legs. This may be a symptom of poor leg circulation (claudication).  Pain and weakness in your legs while you are physically active that goes away when you rest (intermittent claudication).  Leg pain when at rest.  Leg numbness, tingling, or weakness.  Coldness in a leg or foot, especially when compared with the other leg.  Skin or hair changes. These can include:  Hair loss.  Shiny skin.  Pale or bluish skin.  Thick toenails.  Inability to get or maintain an erection (erectile dysfunction). People with PVD are more prone to developing ulcers and sores on their toes, feet, or legs. These may take longer than normal to heal. DIAGNOSIS Your health care provider may diagnose PVD from your signs and symptoms. The health care provider will also do a physical exam. You may have tests to find out what is causing your PVD and determine its severity. Tests may include:  Blood pressure recordings from your arms and legs and measurements of the strength of your pulses (pulse volume recordings).  Imaging studies using sound waves to take pictures of   the blood flow through your blood vessels (Doppler ultrasound).  Injecting a dye into your blood vessels before having imaging studies using:  X-rays (angiogram or arteriogram).  Computer-generated X-rays (CT angiogram).  A powerful electromagnetic field and a computer (magnetic resonance angiogram or MRA). TREATMENT Treatment for PVD depends on the cause of your condition and the severity of your symptoms. It also depends on your age. Underlying  causes need to be treated and controlled. These include long-lasting (chronic) conditions, such as diabetes, high cholesterol, and high blood pressure. You may need to first try making lifestyle changes and taking medicines. Surgery may be needed if these do not work. Lifestyle changes may include:  Quitting smoking.  Exercising regularly.  Following a low-fat, low-cholesterol diet. Medicines may include:  Blood thinners to prevent blood clots.  Medicines to improve blood flow.  Medicines to improve your blood cholesterol levels. Surgical procedures may include:  A procedure that uses an inflated balloon to open a blocked artery and improve blood flow (angioplasty).  A procedure to put in a tube (stent) to keep a blocked artery open (stent implant).  Surgery to reroute blood flow around a blocked artery (peripheral bypass surgery).  Surgery to remove dead tissue from an infected wound on the affected limb.  Amputation. This is surgical removal of the affected limb. This may be necessary in cases of acute ischemic limb that are not improved through medical or surgical treatments. HOME CARE INSTRUCTIONS  Take medicines only as directed by your health care provider.  Do not use any tobacco products, including cigarettes, chewing tobacco, or electronic cigarettes. If you need help quitting, ask your health care provider.  Lose weight if you are overweight, and maintain a healthy weight as directed by your health care provider.  Eat a diet that is low in fat and cholesterol. If you need help, ask your health care provider.  Exercise regularly. Ask your health care provider to suggest some good activities for you.  Use compression stockings or other mechanical devices as directed by your health care provider.  Take good care of your feet.  Wear comfortable shoes that fit well.  Check your feet often for any cuts or sores. SEEK MEDICAL CARE IF:  You have cramps in your legs  while walking.  You have leg pain when you are at rest.  You have coldness in a leg or foot.  Your skin changes.  You have erectile dysfunction.  You have cuts or sores on your feet that are not healing. SEEK IMMEDIATE MEDICAL CARE IF:  Your arm or leg turns cold and blue.  Your arms or legs become red, warm, swollen, painful, or numb.  You have chest pain or trouble breathing.  You suddenly have weakness in your face, arm, or leg.  You become very confused or lose the ability to speak.  You suddenly have a very bad headache or lose your vision.   This information is not intended to replace advice given to you by your health care provider. Make sure you discuss any questions you have with your health care provider.   Document Released: 12/20/2004 Document Revised: 12/03/2014 Document Reviewed: 04/22/2014 [employer] Interactive Patient Education 2016 [employer]  
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
## 2016-01-17 ENCOUNTER — Encounter: Payer: Self-pay | Admitting: Family

## 2016-09-05 ENCOUNTER — Encounter (HOSPITAL_COMMUNITY): Payer: Medicare Other

## 2016-09-07 ENCOUNTER — Other Ambulatory Visit (HOSPITAL_COMMUNITY): Payer: Self-pay | Admitting: *Deleted

## 2016-09-10 ENCOUNTER — Ambulatory Visit (HOSPITAL_COMMUNITY)
Admission: RE | Admit: 2016-09-10 | Discharge: 2016-09-10 | Disposition: A | Discharge status: Home / Self Care | Payer: Medicare Other | Source: Ambulatory Visit | Attending: Nephrology | Admitting: Nephrology

## 2016-09-10 DIAGNOSIS — D509 Iron deficiency anemia, unspecified: Secondary | ICD-10-CM | POA: Insufficient documentation

## 2016-09-10 MED ORDER — FERUMOXYTOL INJECTION 510 MG/17 ML
510.0000 mg | INTRAVENOUS | Status: DC
  Administered 2016-09-10: 510 mg via INTRAVENOUS
  Filled 2016-09-10: qty 17

## 2016-09-10 NOTE — Discharge Instructions (Signed)

## 2016-09-17 ENCOUNTER — Ambulatory Visit (HOSPITAL_COMMUNITY)
Admission: RE | Admit: 2016-09-17 | Discharge: 2016-09-17 | Disposition: A | Discharge status: Home / Self Care | Payer: Medicare Other | Source: Ambulatory Visit | Attending: Nephrology | Admitting: Nephrology

## 2016-09-17 DIAGNOSIS — D509 Iron deficiency anemia, unspecified: Secondary | ICD-10-CM | POA: Insufficient documentation

## 2016-09-17 MED ORDER — SODIUM CHLORIDE 0.9 % IV SOLN
510.0000 mg | INTRAVENOUS | Status: AC
  Administered 2016-09-17: 510 mg via INTRAVENOUS
  Filled 2016-09-17: qty 17

## 2016-12-19 ENCOUNTER — Telehealth: Payer: Self-pay

## 2016-12-19 NOTE — Telephone Encounter (Signed)
Spoke with [name] to reschedule pts appt.

## 2017-01-08 ENCOUNTER — Ambulatory Visit (HOSPITAL_COMMUNITY): Payer: Medicare Other

## 2017-01-08 ENCOUNTER — Ambulatory Visit: Payer: Medicare Other | Admitting: Family

## 2017-02-27 ENCOUNTER — Encounter: Payer: Self-pay | Admitting: Family

## 2017-03-07 ENCOUNTER — Encounter: Payer: Self-pay | Admitting: Family

## 2017-03-07 ENCOUNTER — Ambulatory Visit (HOSPITAL_COMMUNITY)
Admission: RE | Admit: 2017-03-07 | Discharge: 2017-03-07 | Disposition: A | Discharge status: Home / Self Care | Payer: Medicare Other | Source: Ambulatory Visit | Attending: Family | Admitting: Family

## 2017-03-07 ENCOUNTER — Ambulatory Visit (INDEPENDENT_AMBULATORY_CARE_PROVIDER_SITE_OTHER): Payer: Medicare Other | Admitting: Family

## 2017-03-07 ENCOUNTER — Ambulatory Visit (INDEPENDENT_AMBULATORY_CARE_PROVIDER_SITE_OTHER)
Admission: RE | Admit: 2017-03-07 | Discharge: 2017-03-07 | Disposition: A | Discharge status: Home / Self Care | Payer: Medicare Other | Source: Ambulatory Visit | Attending: Family | Admitting: Family

## 2017-03-07 VITALS — BP 154/82 | HR 51 | Temp 96.9°F | Resp 14 | Ht 67.0 in | Wt 153.0 lb

## 2017-03-07 DIAGNOSIS — I779 Disorder of arteries and arterioles, unspecified: Secondary | ICD-10-CM

## 2017-03-07 DIAGNOSIS — I714 Abdominal aortic aneurysm, without rupture, unspecified: Secondary | ICD-10-CM

## 2017-03-07 DIAGNOSIS — F17200 Nicotine dependence, unspecified, uncomplicated: Secondary | ICD-10-CM

## 2017-03-07 DIAGNOSIS — Z95828 Presence of other vascular implants and grafts: Secondary | ICD-10-CM

## 2017-03-07 DIAGNOSIS — Z8679 Personal history of other diseases of the circulatory system: Secondary | ICD-10-CM
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
## 2017-03-07 NOTE — Patient Instructions (Signed)
Peripheral Vascular Disease Peripheral vascular disease (PVD) is a disease of the blood vessels that are not part of your heart and brain. A simple term for PVD is poor circulation. In most cases, PVD narrows the blood vessels that carry blood from your heart to the rest of your body. This can result in a decreased supply of blood to your arms, legs, and internal organs, like your stomach or kidneys. However, it most often affects a person's lower legs and feet. There are two types of PVD.  Organic PVD. This is the more common type. It is caused by damage to the structure of blood vessels.  Functional PVD. This is caused by conditions that make blood vessels contract and tighten (spasm). Without treatment, PVD tends to get worse over time. PVD can also lead to acute ischemic limb. This is when an arm or limb suddenly has trouble getting enough blood. This is a medical emergency. Follow these instructions at home:  Take medicines only as told by your doctor.  Do not use any tobacco products, including cigarettes, chewing tobacco, or electronic cigarettes. If you need help quitting, ask your doctor.  Lose weight if you are overweight, and maintain a healthy weight as told by your doctor.  Eat a diet that is low in fat and cholesterol. If you need help, ask your doctor.  Exercise regularly. Ask your doctor for some good activities for you.  Take good care of your feet.  Wear comfortable shoes that fit well.  Check your feet often for any cuts or sores. Contact a doctor if:  You have cramps in your legs while walking.  You have leg pain when you are at rest.  You have coldness in a leg or foot.  Your skin changes.  You are unable to get or have an erection (erectile dysfunction).  You have cuts or sores on your feet that are not healing. Get help right away if:  Your arm or leg turns cold and blue.  Your arms or legs become red, warm, swollen, painful, or numb.  You have  chest pain or trouble breathing.  You suddenly have weakness in your face, arm, or leg.  You become very confused or you cannot speak.  You suddenly have a very bad headache.  You suddenly cannot see. This information is not intended to replace advice given to you by your health care provider. Make sure you discuss any questions you have with your health care provider. Document Released: 02/06/2010 Document Revised: 04/19/2016 Document Reviewed: 04/22/2014 Elsevier Interactive Patient Education  2017 Elsevier Inc.      Steps to Quit Smoking Smoking tobacco can be bad for your health. It can also affect almost every organ in your body. Smoking puts you and people around you at risk for many serious long-lasting (chronic) diseases. Quitting smoking is hard, but it is one of the best things that you can do for your health. It is never too late to quit. What are the benefits of quitting smoking? When you quit smoking, you lower your risk for getting serious diseases and conditions. They can include:  Lung cancer or lung disease.  Heart disease.  Stroke.  Heart attack.  Not being able to have children (infertility).  Weak bones (osteoporosis) and broken bones (fractures). If you have coughing, wheezing, and shortness of breath, those symptoms may get better when you quit. You may also get sick less often. If you are pregnant, quitting smoking can help to lower your chances   of having a baby of low birth weight. What can I do to help me quit smoking? Talk with your doctor about what can help you quit smoking. Some things you can do (strategies) include:  Quitting smoking totally, instead of slowly cutting back how much you smoke over a period of time.  Going to in-person counseling. You are more likely to quit if you go to many counseling sessions.  Using resources and support systems, such as:  Online chats with a counselor.  Phone quitlines.  Printed self-help  materials.  Support groups or group counseling.  Text messaging programs.  Mobile phone apps or applications.  Taking medicines. Some of these medicines may have nicotine in them. If you are pregnant or breastfeeding, do not take any medicines to quit smoking unless your doctor says it is okay. Talk with your doctor about counseling or other things that can help you. Talk with your doctor about using more than one strategy at the same time, such as taking medicines while you are also going to in-person counseling. This can help make quitting easier. What things can I do to make it easier to quit? Quitting smoking might feel very hard at first, but there is a lot that you can do to make it easier. Take these steps:  Talk to your family and friends. Ask them to support and encourage you.  Call phone quitlines, reach out to support groups, or work with a counselor.  Ask people who smoke to not smoke around you.  Avoid places that make you want (trigger) to smoke, such as:  Bars.  Parties.  Smoke-break areas at work.  Spend time with people who do not smoke.  Lower the stress in your life. Stress can make you want to smoke. Try these things to help your stress:  Getting regular exercise.  Deep-breathing exercises.  Yoga.  Meditating.  Doing a body scan. To do this, close your eyes, focus on one area of your body at a time from head to toe, and notice which parts of your body are tense. Try to relax the muscles in those areas.  Download or buy apps on your mobile phone or tablet that can help you stick to your quit plan. There are many free apps, such as QuitGuide from the CDC (Centers for Disease Control and Prevention). You can find more support from smokefree.gov and other websites. This information is not intended to replace advice given to you by your health care provider. Make sure you discuss any questions you have with your health care provider. Document Released:  09/08/2009 Document Revised: 07/10/2016 Document Reviewed: 03/29/2015 Elsevier Interactive Patient Education  2017 Elsevier Inc.  

## 2017-03-21 ENCOUNTER — Encounter: Payer: Self-pay | Admitting: Vascular Surgery

## 2017-04-02 ENCOUNTER — Ambulatory Visit (INDEPENDENT_AMBULATORY_CARE_PROVIDER_SITE_OTHER): Payer: Medicare Other | Admitting: Vascular Surgery

## 2017-04-02 ENCOUNTER — Other Ambulatory Visit: Payer: Self-pay | Admitting: Family

## 2017-04-02 ENCOUNTER — Ambulatory Visit
Admission: RE | Admit: 2017-04-02 | Discharge: 2017-04-02 | Disposition: A | Discharge status: Home / Self Care | Payer: Medicare Other | Source: Ambulatory Visit | Attending: Family | Admitting: Family

## 2017-04-02 ENCOUNTER — Encounter: Payer: Self-pay | Admitting: Vascular Surgery

## 2017-04-02 VITALS — BP 147/98 | HR 111 | Temp 97.1°F | Resp 18 | Ht 67.0 in | Wt 153.0 lb

## 2017-04-02 DIAGNOSIS — Z95828 Presence of other vascular implants and grafts: Secondary | ICD-10-CM

## 2017-04-02 DIAGNOSIS — I714 Abdominal aortic aneurysm, without rupture, unspecified: Secondary | ICD-10-CM

## 2017-04-02 NOTE — Progress Notes (Signed)
[facility] [place]  Patient name: Marc [name] MRN: [id number] DOB: [birth year] Sex: male  REASON FOR VISIT: Follow-up abdominal aortic aneurysm  HPI: Marc [name] is a 83 y.o. male 5 years status post stent graft repair of abdominal aortic aneurysm. On recent duplex ultrasound follow-up was found to have some enlargement and his aneurysm sac size. He was scheduled for CT scan for further evaluation of this. He does have known arterial insufficiency. Creatinine was 2.1 and he underwent a noncontrast study. He has a general failing health. Is here today with his daughter. Has generalized weakness. Unsteady gait with occasional falling. Does have a diffuse back pain which is unchanged.  Past Medical History:  Diagnosis Date  . AAA (abdominal aortic aneurysm) ([place])   . Arthritis   . Coronary artery disease    s/p CABG Dr. Servando Snare, Dr. Acie Fredrickson  . Gall stones   . GERD (gastroesophageal reflux disease)   . History of Clostridium difficile infection   . History of ulcerative colitis   . Hyperlipidemia   . Hypertension   . Kidney stone on left side    has not passed as of 09/23/12  . Stroke [facility]) 2012   affecting RUE    Family History  Problem Relation Age of Onset  . Stroke Mother   . Hypertension Mother   . Heart disease Sister     Aneurysm of the Brain    SOCIAL HISTORY: Social History  Substance Use Topics  . Smoking status: Former Smoker    Packs/day: 1.00    Years: 68.00    Types: Cigarettes    Quit date: 08/04/2012  . Smokeless tobacco: Never Used  . Alcohol use No    No Known Allergies  Current Outpatient Prescriptions  Medication Sig Dispense Refill  . acetaminophen (TYLENOL) 500 MG tablet Take 500 mg by mouth every 8 (eight) hours as needed.    Marland Kitchen allopurinol (ZYLOPRIM) 100 MG tablet TAKE 2 TABLETS BY MOUTH DAILY AFTER MEALS FOR GOUT  3  . amoxicillin (AMOXIL) 500 MG capsule Reported on 01/04/2016      . aspirin EC 81 MG tablet Take 81 mg by mouth daily.    Marland Kitchen atorvastatin (LIPITOR) 20 MG tablet TAKE 1 TABLET BY MOUTH DAILY AT 6PM (Patient not taking: Reported on 04/02/2017) 30 tablet 1  . Calcium Carbonate Antacid (TUMS PO) Take by mouth daily.    Marland Kitchen escitalopram (LEXAPRO) 20 MG tablet Take 20 mg by mouth daily.    Marland Kitchen gabapentin (NEURONTIN) 300 MG capsule 100 mg.     . metoprolol (LOPRESSOR) 100 MG tablet TAKE 2 TABLETS BY MOUTH EVERY DAY FOR HYPERTENSION  7  . Multiple Vitamin (MULTIVITAMIN WITH MINERALS) TABS Take 1 tablet by mouth daily.    . pseudoephedrine-acetaminophen (TYLENOL SINUS) 30-500 MG TABS Take 1 tablet by mouth every 4 (four) hours as needed.    . ranitidine (ZANTAC) 150 MG capsule Take 150 mg by mouth 2 (two) times daily as needed. For acid reflux    . SODIUM BICARBONATE PO Take by mouth 2 (two) times daily.    . tamsulosin (FLOMAX) 0.4 MG CAPS capsule Take 0.4 mg by mouth at bedtime.  7   No current facility-administered medications for this visit.     REVIEW OF SYSTEMS:  [X]  denotes positive finding, [ ]  denotes negative finding Cardiac  Comments:  Chest pain or chest pressure:    Shortness of breath upon exertion: x   Short of  breath when lying flat:    Irregular heart rhythm:        Vascular    Pain in calf, thigh, or hip brought on by ambulation:    Pain in feet at night that wakes you up from your sleep:     Blood clot in your veins:    Leg swelling:           PHYSICAL EXAM: Vitals:   04/02/17 1509 04/02/17 1513  BP: (!) 152/95 (!) 147/98  Pulse: (!) 111   Resp: 18   Temp: 97.1 F (36.2 C)   TempSrc: Oral   SpO2: 93%   Weight: 153 lb (69.4 kg)   Height: 5\' 7"  (1.702 m)     GENERAL: The patient is a well-nourished male, in no acute distress. The vital signs are documented above.Frail appearing CARDIOVASCULAR: Palpable radial and femoral pulses. Abdomen soft with no palpable aneurysm PULMONARY: There is good air exchange  MUSCULOSKELETAL: There  are no major deformities or cyanosis. NEUROLOGIC: No focal weakness or paresthesias are detected. SKIN: There are no ulcers or rashes noted. PSYCHIATRIC: The patient has a normal affect.  DATA:  CT scan from today was discussed with the patient and his daughter. He does have enlargement of his aneurysm size suggests over 7 cm with comparison to his December 2015 study of 6.1 cm. This is a noncontrast study shows difficult determine the possible presence of an endoleak. Does not appear to have any disruption of his components and does appear to have ceiling of his proximal and distal attachment sites.  After the patient left our office I was called by Dr.Henn who is a radiologist interpreting the study. Was concern regarding an area of the potential surgeon at the approximate 7:00 position on the right medial portion of his aneurysm sac. This was not present in 2015 and may represent contained a blood in this area.  MEDICAL ISSUES: Very difficult management situation with a very frail 83 year old gentleman. Also with his renal insufficiency makes further evaluation of this difficult. I will contact patient tomorrow and schedule probable admission for hydration and a contrast CT scan for further evaluation. Inserting with aneurysm sac enlargement and also the area of concern on the right medial portion of his aortic aneurysm sac.    Rosetta Posner, MD FACS [facility] Tel [phone] Pager [phone]

## 2017-04-08 NOTE — Addendum Note (Signed)
Addended by: Lianne Cure A on: 04/08/2017 10:20 AM   Modules accepted: Orders

## 2017-04-09 ENCOUNTER — Observation Stay (HOSPITAL_COMMUNITY)
Admission: AD | Admit: 2017-04-09 | Discharge: 2017-04-10 | Disposition: A | Discharge status: Home / Self Care | Payer: Medicare Other | Source: Ambulatory Visit | Attending: Vascular Surgery | Admitting: Vascular Surgery

## 2017-04-09 ENCOUNTER — Encounter (HOSPITAL_COMMUNITY): Payer: Self-pay | Admitting: General Practice

## 2017-04-09 DIAGNOSIS — I714 Abdominal aortic aneurysm, without rupture, unspecified: Secondary | ICD-10-CM | POA: Diagnosis present

## 2017-04-09 DIAGNOSIS — I771 Stricture of artery: Secondary | ICD-10-CM | POA: Insufficient documentation

## 2017-04-09 DIAGNOSIS — Z87891 Personal history of nicotine dependence: Secondary | ICD-10-CM | POA: Insufficient documentation

## 2017-04-09 DIAGNOSIS — Z8619 Personal history of other infectious and parasitic diseases: Secondary | ICD-10-CM | POA: Insufficient documentation

## 2017-04-09 DIAGNOSIS — I10 Essential (primary) hypertension: Secondary | ICD-10-CM | POA: Insufficient documentation

## 2017-04-09 DIAGNOSIS — Z79899 Other long term (current) drug therapy: Secondary | ICD-10-CM | POA: Diagnosis not present

## 2017-04-09 DIAGNOSIS — Z9181 History of falling: Secondary | ICD-10-CM | POA: Diagnosis not present

## 2017-04-09 DIAGNOSIS — I2510 Atherosclerotic heart disease of native coronary artery without angina pectoris: Secondary | ICD-10-CM | POA: Insufficient documentation

## 2017-04-09 DIAGNOSIS — R531 Weakness: Secondary | ICD-10-CM | POA: Diagnosis not present

## 2017-04-09 DIAGNOSIS — R2681 Unsteadiness on feet: Secondary | ICD-10-CM | POA: Insufficient documentation

## 2017-04-09 DIAGNOSIS — Z7982 Long term (current) use of aspirin: Secondary | ICD-10-CM | POA: Insufficient documentation

## 2017-04-09 DIAGNOSIS — E785 Hyperlipidemia, unspecified: Secondary | ICD-10-CM | POA: Insufficient documentation

## 2017-04-09 DIAGNOSIS — Z48812 Encounter for surgical aftercare following surgery on the circulatory system: Secondary | ICD-10-CM | POA: Diagnosis not present

## 2017-04-09 DIAGNOSIS — N289 Disorder of kidney and ureter, unspecified: Secondary | ICD-10-CM | POA: Insufficient documentation

## 2017-04-09 DIAGNOSIS — Z8679 Personal history of other diseases of the circulatory system: Secondary | ICD-10-CM | POA: Diagnosis not present

## 2017-04-09 DIAGNOSIS — Z951 Presence of aortocoronary bypass graft: Secondary | ICD-10-CM | POA: Insufficient documentation

## 2017-04-09 DIAGNOSIS — Z8673 Personal history of transient ischemic attack (TIA), and cerebral infarction without residual deficits: Secondary | ICD-10-CM | POA: Insufficient documentation

## 2017-04-09 LAB — URINALYSIS, ROUTINE W REFLEX MICROSCOPIC
Bilirubin Urine: NEGATIVE
Glucose, UA: NEGATIVE mg/dL
Hgb urine dipstick: NEGATIVE
KETONES UR: NEGATIVE mg/dL
LEUKOCYTES UA: NEGATIVE
NITRITE: NEGATIVE
PROTEIN: NEGATIVE mg/dL
Specific Gravity, Urine: 1.012 (ref 1.005–1.030)
pH: 7 (ref 5.0–8.0)

## 2017-04-09 LAB — CBC
HCT: 43.6 % (ref 39.0–52.0)
Hemoglobin: 14.5 g/dL (ref 13.0–17.0)
MCH: 30.8 pg (ref 26.0–34.0)
MCHC: 33.3 g/dL (ref 30.0–36.0)
MCV: 92.6 fL (ref 78.0–100.0)
Platelets: 162 10*3/uL (ref 150–400)
RBC: 4.71 MIL/uL (ref 4.22–5.81)
RDW: 14 % (ref 11.5–15.5)
WBC: 6.9 10*3/uL (ref 4.0–10.5)

## 2017-04-09 LAB — PROTIME-INR
INR: 1.09
PROTHROMBIN TIME: 14.1 s (ref 11.4–15.2)

## 2017-04-09 LAB — BASIC METABOLIC PANEL
Anion gap: 9 (ref 5–15)
BUN: 19 mg/dL (ref 6–20)
CHLORIDE: 102 mmol/L (ref 101–111)
CO2: 28 mmol/L (ref 22–32)
Calcium: 9.4 mg/dL (ref 8.9–10.3)
Creatinine, Ser: 2.04 mg/dL — ABNORMAL HIGH (ref 0.61–1.24)
GFR, EST AFRICAN AMERICAN: 33 mL/min — AB (ref >60)
GFR, EST NON AFRICAN AMERICAN: 28 mL/min — AB (ref >60)
Glucose, Bld: 78 mg/dL (ref 65–99)
POTASSIUM: 4.2 mmol/L (ref 3.5–5.1)
SODIUM: 139 mmol/L (ref 135–145)

## 2017-04-09 MED ORDER — POLYETHYLENE GLYCOL 3350 17 G PO PACK: 17.0000 g | PACK | Freq: Every day | ORAL | Status: DC | PRN

## 2017-04-09 MED ORDER — ACETAMINOPHEN 325 MG RE SUPP: 325.0000 mg | Freq: Four times a day (QID) | RECTAL | Status: DC | PRN

## 2017-04-09 MED ORDER — LABETALOL HCL 5 MG/ML IV SOLN: 10.0000 mg | INTRAVENOUS | Status: DC | PRN

## 2017-04-09 MED ORDER — ATORVASTATIN CALCIUM 20 MG PO TABS: 20.0000 mg | ORAL_TABLET | Freq: Every day | ORAL | Status: DC

## 2017-04-09 MED ORDER — ESCITALOPRAM OXALATE 20 MG PO TABS
20.0000 mg | ORAL_TABLET | Freq: Every day | ORAL | Status: DC
  Administered 2017-04-10: 20 mg via ORAL
  Filled 2017-04-09: qty 1

## 2017-04-09 MED ORDER — PHENOL 1.4 % MT LIQD: 1.0000 | OROMUCOSAL | Status: DC | PRN

## 2017-04-09 MED ORDER — TAMSULOSIN HCL 0.4 MG PO CAPS
0.4000 mg | ORAL_CAPSULE | Freq: Every day | ORAL | Status: DC
  Administered 2017-04-09: 0.4 mg via ORAL
  Filled 2017-04-09: qty 1

## 2017-04-09 MED ORDER — HEPARIN SODIUM (PORCINE) 5000 UNIT/ML IJ SOLN
5000.0000 [IU] | Freq: Three times a day (TID) | INTRAMUSCULAR | Status: DC
  Administered 2017-04-09 – 2017-04-10 (×2): 5000 [IU] via SUBCUTANEOUS
  Filled 2017-04-09 (×2): qty 1

## 2017-04-09 MED ORDER — ADULT MULTIVITAMIN W/MINERALS CH
1.0000 | ORAL_TABLET | Freq: Every day | ORAL | Status: DC
  Administered 2017-04-10: 1 via ORAL
  Filled 2017-04-09: qty 1

## 2017-04-09 MED ORDER — ASPIRIN EC 81 MG PO TBEC
81.0000 mg | DELAYED_RELEASE_TABLET | Freq: Every day | ORAL | Status: DC
  Administered 2017-04-10: 81 mg via ORAL
  Filled 2017-04-09: qty 1

## 2017-04-09 MED ORDER — HYDRALAZINE HCL 20 MG/ML IJ SOLN: 5.0000 mg | INTRAMUSCULAR | Status: DC | PRN

## 2017-04-09 MED ORDER — ACETAMINOPHEN 325 MG PO TABS: 325.0000 mg | ORAL_TABLET | Freq: Four times a day (QID) | ORAL | Status: DC | PRN

## 2017-04-09 MED ORDER — PANTOPRAZOLE SODIUM 40 MG PO TBEC
40.0000 mg | DELAYED_RELEASE_TABLET | Freq: Every day | ORAL | Status: DC
  Administered 2017-04-10: 40 mg via ORAL
  Filled 2017-04-09: qty 1

## 2017-04-09 MED ORDER — POTASSIUM CHLORIDE CRYS ER 20 MEQ PO TBCR: 20.0000 meq | EXTENDED_RELEASE_TABLET | Freq: Once | ORAL | Status: DC

## 2017-04-09 MED ORDER — SODIUM CHLORIDE 0.9 % IV SOLN
INTRAVENOUS | Status: DC
  Administered 2017-04-09: 16:00:00 via INTRAVENOUS

## 2017-04-09 MED ORDER — GABAPENTIN 300 MG PO CAPS
300.0000 mg | ORAL_CAPSULE | Freq: Every day | ORAL | Status: DC
  Administered 2017-04-10: 300 mg via ORAL
  Filled 2017-04-09: qty 1

## 2017-04-09 MED ORDER — METOPROLOL TARTRATE 5 MG/5ML IV SOLN: 2.0000 mg | INTRAVENOUS | Status: DC | PRN

## 2017-04-09 MED ORDER — BISACODYL 10 MG RE SUPP: 10.0000 mg | Freq: Every day | RECTAL | Status: DC | PRN

## 2017-04-09 MED ORDER — OXYCODONE-ACETAMINOPHEN 5-325 MG PO TABS: 1.0000 | ORAL_TABLET | Freq: Three times a day (TID) | ORAL | Status: DC | PRN

## 2017-04-09 MED ORDER — ONDANSETRON HCL 4 MG/2ML IJ SOLN: 4.0000 mg | Freq: Four times a day (QID) | INTRAMUSCULAR | Status: DC | PRN

## 2017-04-09 MED ORDER — ALLOPURINOL 100 MG PO TABS
200.0000 mg | ORAL_TABLET | Freq: Every day | ORAL | Status: DC
  Administered 2017-04-10: 200 mg via ORAL
  Filled 2017-04-09: qty 2

## 2017-04-09 MED ORDER — GUAIFENESIN-DM 100-10 MG/5ML PO SYRP: 15.0000 mL | ORAL_SOLUTION | ORAL | Status: DC | PRN

## 2017-04-10 ENCOUNTER — Telehealth: Payer: Self-pay | Admitting: Vascular Surgery

## 2017-04-10 ENCOUNTER — Observation Stay (HOSPITAL_COMMUNITY): Payer: Medicare Other

## 2017-04-10 DIAGNOSIS — Z48812 Encounter for surgical aftercare following surgery on the circulatory system: Secondary | ICD-10-CM | POA: Diagnosis not present

## 2017-04-10 LAB — CBC
HCT: 37.6 % — ABNORMAL LOW (ref 39.0–52.0)
HEMOGLOBIN: 12.3 g/dL — AB (ref 13.0–17.0)
MCH: 30.1 pg (ref 26.0–34.0)
MCHC: 32.7 g/dL (ref 30.0–36.0)
MCV: 92.2 fL (ref 78.0–100.0)
PLATELETS: 144 10*3/uL — AB (ref 150–400)
RBC: 4.08 MIL/uL — ABNORMAL LOW (ref 4.22–5.81)
RDW: 13.9 % (ref 11.5–15.5)
WBC: 6.7 10*3/uL (ref 4.0–10.5)

## 2017-04-10 LAB — BASIC METABOLIC PANEL
Anion gap: 8 (ref 5–15)
BUN: 19 mg/dL (ref 6–20)
CALCIUM: 8.8 mg/dL — AB (ref 8.9–10.3)
CHLORIDE: 103 mmol/L (ref 101–111)
CO2: 25 mmol/L (ref 22–32)
CREATININE: 2.02 mg/dL — AB (ref 0.61–1.24)
GFR calc Af Amer: 33 mL/min — ABNORMAL LOW (ref >60)
GFR, EST NON AFRICAN AMERICAN: 28 mL/min — AB (ref >60)
Glucose, Bld: 102 mg/dL — ABNORMAL HIGH (ref 65–99)
Potassium: 3.8 mmol/L (ref 3.5–5.1)
SODIUM: 136 mmol/L (ref 135–145)

## 2017-04-10 MED ORDER — IOPAMIDOL (ISOVUE-370) INJECTION 76%
INTRAVENOUS | Status: AC
  Administered 2017-04-10: 100 mL via INTRAVENOUS
  Filled 2017-04-10: qty 100

## 2017-04-10 NOTE — Telephone Encounter (Signed)
Sched appt 04/24/17 at 8:45. Spoke to pt's daughter to confirm appt.

## 2017-04-10 NOTE — Telephone Encounter (Signed)
-----   Message from Mena Goes, RN sent at 04/10/2017 11:47 AM EDT ----- Regarding: 1-2  weeks w/ TFE to discuss CTA   ----- Message ----- From: Ansel Bong Sent: 04/10/2017  10:49 AM To: Vvs Charge Pool  Patient s/p EVAR 5 years ago. Had CTA today to evaluate aneurysm. Will need f/u with Dr. Donnetta Hutching in next 1-2 weeks to discuss results.   Thanks Maudie Mercury

## 2017-04-10 NOTE — H&P (Addendum)
Progress Notes Encounter Date: 04/02/2017 Rosetta Posner, MD  Vascular Surgery    [] Hide copied text [] Hover for attribution information                                       [facility] Special[facility] of [facility]  Patient name: Marc [name]         MRN: [id number]        DOB: 1930/10/11          Sex: male  REASON FOR VISIT: Follow-up abdominal aortic aneurysm  HPI: Marc [name] is a 83 y.o. male 5 years status post stent graft repair of abdominal aortic aneurysm. On recent duplex ultrasound follow-up was found to have some enlargement and his aneurysm sac size. He was scheduled for CT scan for further evaluation of this. He does have known arterial insufficiency. Creatinine was 2.1 and he underwent a noncontrast study. He has a general failing health. Is here today with his daughter. Has generalized weakness. Unsteady gait with occasional falling. Does have a diffuse back pain which is unchanged.      Past Medical History:  Diagnosis Date  . AAA (abdominal aortic aneurysm) ([place])   . Arthritis   . Coronary artery disease    s/p CABG Dr. Servando Snare, Dr. Acie Fredrickson  . Gall stones   . GERD (gastroesophageal reflux disease)   . History of Clostridium difficile infection   . History of ulcerative colitis   . Hyperlipidemia   . Hypertension   . Kidney stone on left side    has not passed as of 09/23/12  . Stroke [facility]) 2012   affecting RUE          Family History  Problem Relation Age of Onset  . Stroke Mother   . Hypertension Mother   . Heart disease Sister     Aneurysm of the Brain    SOCIAL HISTORY:      Social History  Substance Use Topics  . Smoking status: Former Smoker    Packs/day: 1.00    Years: 68.00    Types: Cigarettes    Quit date: 08/04/2012  . Smokeless tobacco: Never Used  . Alcohol use No    No Known Allergies        Current Outpatient Prescriptions  Medication Sig Dispense Refill  . acetaminophen (TYLENOL)  500 MG tablet Take 500 mg by mouth every 8 (eight) hours as needed.    Marland Kitchen allopurinol (ZYLOPRIM) 100 MG tablet TAKE 2 TABLETS BY MOUTH DAILY AFTER MEALS FOR GOUT  3  . amoxicillin (AMOXIL) 500 MG capsule Reported on 01/04/2016    . aspirin EC 81 MG tablet Take 81 mg by mouth daily.    Marland Kitchen atorvastatin (LIPITOR) 20 MG tablet TAKE 1 TABLET BY MOUTH DAILY AT 6PM (Patient not taking: Reported on 04/02/2017) 30 tablet 1  . Calcium Carbonate Antacid (TUMS PO) Take by mouth daily.    Marland Kitchen escitalopram (LEXAPRO) 20 MG tablet Take 20 mg by mouth daily.    Marland Kitchen gabapentin (NEURONTIN) 300 MG capsule 100 mg.     . metoprolol (LOPRESSOR) 100 MG tablet TAKE 2 TABLETS BY MOUTH EVERY DAY FOR HYPERTENSION  7  . Multiple Vitamin (MULTIVITAMIN WITH MINERALS) TABS Take 1 tablet by mouth daily.    . pseudoephedrine-acetaminophen (TYLENOL SINUS) 30-500 MG TABS Take 1 tablet by mouth every 4 (four) hours as needed.    Marland Kitchen  ranitidine (ZANTAC) 150 MG capsule Take 150 mg by mouth 2 (two) times daily as needed. For acid reflux    . SODIUM BICARBONATE PO Take by mouth 2 (two) times daily.    . tamsulosin (FLOMAX) 0.4 MG CAPS capsule Take 0.4 mg by mouth at bedtime.  7   No current facility-administered medications for this visit.     REVIEW OF SYSTEMS:  [X]  denotes positive finding, [ ]  denotes negative finding Cardiac  Comments:  Chest pain or chest pressure:    Shortness of breath upon exertion: x   Short of breath when lying flat:    Irregular heart rhythm:        Vascular    Pain in calf, thigh, or hip brought on by ambulation:    Pain in feet at night that wakes you up from your sleep:     Blood clot in your veins:    Leg swelling:           PHYSICAL EXAM:     Vitals:   04/02/17 1509 04/02/17 1513  BP: (!) 152/95 (!) 147/98  Pulse: (!) 111   Resp: 18   Temp: 97.1 F (36.2 C)   TempSrc: Oral   SpO2: 93%   Weight: 153 lb (69.4 kg)   Height: 5\' 7"   (1.702 m)     GENERAL: The patient is a well-nourished male, in no acute distress. The vital signs are documented above.Frail appearing CARDIOVASCULAR: Palpable radial and femoral pulses. Abdomen soft with no palpable aneurysm PULMONARY: There is good air exchange  MUSCULOSKELETAL: There are no major deformities or cyanosis. NEUROLOGIC: No focal weakness or paresthesias are detected. SKIN: There are no ulcers or rashes noted. PSYCHIATRIC: The patient has a normal affect.  DATA:  CT scan from today was discussed with the patient and his daughter. He does have enlargement of his aneurysm size suggests over 7 cm with comparison to his December 2015 study of 6.1 cm. This is a noncontrast study shows difficult determine the possible presence of an endoleak. Does not appear to have any disruption of his components and does appear to have ceiling of his proximal and distal attachment sites.  After the patient left our office I was called by Dr.Henn who is a radiologist interpreting the study. Was concern regarding an area of the potential surgeon at the approximate 7:00 position on the right medial portion of his aneurysm sac. This was not present in 2015 and may represent contained a blood in this area.  MEDICAL ISSUES: Very difficult management situation with a very frail 83 year old gentleman. Also with his renal insufficiency makes further evaluation of this difficult. I will contact patient tomorrow and schedule probable admission for hydration and a contrast CT scan for further evaluation. Inserting with aneurysm sac enlargement and also the area of concern on the right medial portion of his aortic aneurysm sac.    Rosetta Posner, MD FACS [facility] [phone] Pager [phone]     Electronically signed by Rosetta Posner, MD at 04/02/2017 7:53 PM      Office Visit on 04/02/2017        Routing History        Detailed Report     I have examined the patient, reviewed and agree with above.Reports that he slept very little last night. Had overnight hydration. Creatinine was 2 on admission and 2 early this morning. Will obtain a CT angiogram for evaluation of his  stent graft with that recommendations to follow.  Curt Jews, MD 04/10/2017  8:57 AM

## 2017-04-11 ENCOUNTER — Telehealth: Payer: Self-pay | Admitting: Vascular Surgery

## 2017-04-11 NOTE — Telephone Encounter (Signed)
I spoke with the patient's daughter Ms. Thomas regarding CT scan from yesterday. Explained that it did not show a junctional endoleak or flow into the area on the right side of his aneurysm sac. Explained that I would review his films with several other surgeons and make recommendation. Explained that it is concerning with the expansion and the area that appears to be a contained rupture of the lateral wall. Will communicate this with her further tomorrow

## 2017-04-12 ENCOUNTER — Encounter: Payer: Self-pay | Admitting: Vascular Surgery

## 2017-04-12 NOTE — Discharge Summary (Signed)
[facility] Discharge Summary  Marc [name] November 19, 1930 83 y.o. male  [id number]  Admission Date: 04/09/2017  Discharge Date: 04/10/2017  Physician: Curt Jews, MD  Admission Diagnosis: Aortic expansion of EVAR renal insufficiency  HPI:   This is a 83 y.o. male 5 years status post stent graft repair of abdominal aortic aneurysm. On recent duplex ultrasound follow-up was found to have some enlargement and his aneurysm sac size. He was scheduled for CT scan for further evaluation of this. He does have known arterial insufficiency. Creatinine was 2.1 and he underwent a noncontrast study. He has a general failing health. Is here today with his daughter. Has generalized weakness. Unsteady gait with occasional falling. Does have a diffuse back pain which is unchanged.   Hospital Course:  The patient was admitted to the hospital for IV hydration given his renal function. He underwent a CTA abdomen/pelvis on 04/10/17. He was discharged following his study. Dr. Donnetta Hutching to call and discuss results with patient.     CBC    Component Value Date/Time   WBC 6.7 04/10/2017 0333   RBC 4.08 (L) 04/10/2017 0333   HGB 12.3 (L) 04/10/2017 0333   HCT 37.6 (L) 04/10/2017 0333   PLT 144 (L) 04/10/2017 0333   MCV 92.2 04/10/2017 0333   MCH 30.1 04/10/2017 0333   MCHC 32.7 04/10/2017 0333   RDW 13.9 04/10/2017 0333   LYMPHSABS 1.0 07/23/2012 1021   MONOABS 0.6 07/23/2012 1021   EOSABS 0.2 07/23/2012 1021   BASOSABS 0.0 07/23/2012 1021    BMET    Component Value Date/Time   NA 136 04/10/2017 0333   K 3.8 04/10/2017 0333   CL 103 04/10/2017 0333   CO2 25 04/10/2017 0333   GLUCOSE 102 (H) 04/10/2017 0333   BUN 19 04/10/2017 0333   CREATININE 2.02 (H) 04/10/2017 0333   CREATININE 1.46 (H) 07/03/2012 1436   CALCIUM 8.8 (L) 04/10/2017 0333   GFRNONAA 28 (L) 04/10/2017 0333   GFRAA 33 (L) 04/10/2017 0333     Discharge Instructions:   The patient is discharged to home  with extensive instructions on wound care and progressive ambulation.  They are instructed not to drive or perform any heavy lifting until returning to see the physician in his office.  Discharge Instructions    Activity as tolerated - No restrictions    Complete by:  As directed    Call MD for:  redness, tenderness, or signs of infection (pain, swelling, bleeding, redness, odor or green/yellow discharge around incision site)    Complete by:  As directed    Call MD for:  severe or increased pain, loss or decreased feeling  in affected limb(s)    Complete by:  As directed    Call MD for:  temperature >100.5    Complete by:  As directed    Resume previous diet    Complete by:  As directed       Discharge Diagnosis:  Aortic expansion of EVAR renal insufficiency  Secondary Diagnosis: Patient Active Problem List   Diagnosis Date Noted  . Atherosclerosis of native arteries of the extremities with intermittent claudication 07/06/2014  . Pain in limb 07/06/2014  . Weakness of both legs 07/06/2014  . Numbness in both legs 07/06/2014  . Acute on chronic renal insufficiency 02/20/2013  . Essential hypertension 12/01/2012  . AAA (abdominal aortic aneurysm) ([place]) 10/28/2012  . Coronary artery disease 08/04/2012    Class: Diagnosis of  . History of stroke without  residual deficits 08/04/2012    Class: Diagnosis of  . Tobacco dependence 08/04/2012  . Abdominal aneurysm without mention of rupture 01/08/2012  . CLOSTRIDIUM DIFFICILE COLITIS 08/30/2009  . DIVERTICULOSIS-COLON 08/30/2009  . WEIGHT LOSS-ABNORMAL 08/30/2009  . PERSONAL HX COLONIC POLYPS 08/30/2009   Past Medical History:  Diagnosis Date  . AAA (abdominal aortic aneurysm) ([place])   . AAA (abdominal aortic aneurysm) ([place])   . Arthritis   . Coronary artery disease    s/p CABG Dr. Servando Snare, Dr. Acie Fredrickson  . Gall stones   . GERD (gastroesophageal reflux disease)   . History of Clostridium difficile infection   . History of  ulcerative colitis   . Hyperlipidemia   . Hypertension   . Kidney stone on left side    has not passed as of 09/23/12  . Stroke [facility]) 2012   affecting RUE     Allergies as of 04/10/2017   No Known Allergies     Medication List    TAKE these medications   acetaminophen 500 MG tablet Commonly known as:  TYLENOL Take 1,000 mg by mouth every 12 (twelve) hours.   allopurinol 100 MG tablet Commonly known as:  ZYLOPRIM TAKE 200 mg BY MOUTH DAILY AFTER MEALS FOR GOUT   aspirin EC 81 MG tablet Take 81 mg by mouth daily.   atorvastatin 20 MG tablet Commonly known as:  LIPITOR TAKE 1 TABLET BY MOUTH DAILY AT 6PM   escitalopram 20 MG tablet Commonly known as:  LEXAPRO Take 20 mg by mouth at bedtime.   gabapentin 100 MG capsule Commonly known as:  NEURONTIN Take 100 mg by mouth at bedtime.   guaiFENesin 600 MG 12 hr tablet Commonly known as:  MUCINEX Take 600 mg by mouth 2 (two) times daily as needed for cough or to loosen phlegm.   metoprolol tartrate 100 MG tablet Commonly known as:  LOPRESSOR TAKE 200 MG BY MOUTH EVERY DAY FOR HYPERTENSION   multivitamin with minerals Tabs tablet Take 1 tablet by mouth daily.   ranitidine 150 MG capsule Commonly known as:  ZANTAC Take 150 mg by mouth at bedtime.   sodium bicarbonate 650 MG tablet Take 650 mg by mouth 2 (two) times daily.   tamsulosin 0.4 MG Caps capsule Commonly known as:  FLOMAX Take 0.4 mg by mouth at bedtime.       Disposition: Home  Patient's condition: is Good  Follow up: 1. Dr. Donnetta Hutching in 1-2 weeks   Virgina Jock, PA-C [facility] [phone] 04/12/2017  9:49 AM

## 2017-04-16 ENCOUNTER — Other Ambulatory Visit: Payer: Self-pay

## 2017-04-17 ENCOUNTER — Telehealth: Payer: Self-pay | Admitting: Vascular Surgery

## 2017-04-17 NOTE — Telephone Encounter (Signed)
-----   Message from Denman George, RN sent at 04/16/2017  6:38 PM EDT ----- Regarding: cancel office appt. Cancel appt. with Dr. Donnetta Hutching on 04/24/17; scheduled for surgery on 04/26/17.  Dr. Donnetta Hutching has reviewed CTA results with pt's daughter, and said the pt. doesn't need the appt. on 5/30.  I spoke with pt's daughter; she is aware of plan to cancel office visit.

## 2017-04-19 ENCOUNTER — Encounter (HOSPITAL_COMMUNITY): Payer: Self-pay

## 2017-04-19 ENCOUNTER — Encounter (HOSPITAL_COMMUNITY)
Admission: RE | Admit: 2017-04-19 | Discharge: 2017-04-19 | Disposition: A | Discharge status: Home / Self Care | Payer: Medicare Other | Source: Ambulatory Visit | Attending: Vascular Surgery | Admitting: Vascular Surgery

## 2017-04-19 ENCOUNTER — Ambulatory Visit (HOSPITAL_COMMUNITY)
Admission: RE | Admit: 2017-04-19 | Discharge: 2017-04-19 | Disposition: A | Discharge status: Home / Self Care | Payer: Medicare Other | Source: Ambulatory Visit | Attending: Anesthesiology | Admitting: Anesthesiology

## 2017-04-19 DIAGNOSIS — R05 Cough: Secondary | ICD-10-CM | POA: Insufficient documentation

## 2017-04-19 DIAGNOSIS — Z01818 Encounter for other preprocedural examination: Secondary | ICD-10-CM | POA: Insufficient documentation

## 2017-04-19 DIAGNOSIS — Z01812 Encounter for preprocedural laboratory examination: Secondary | ICD-10-CM | POA: Insufficient documentation

## 2017-04-19 DIAGNOSIS — R059 Cough, unspecified: Secondary | ICD-10-CM

## 2017-04-19 HISTORY — DX: Repeated falls: R29.6

## 2017-04-19 HISTORY — DX: Personal history of urinary calculi: Z87.442

## 2017-04-19 HISTORY — DX: Gout, unspecified: M10.9

## 2017-04-19 HISTORY — DX: Chronic kidney disease, unspecified: N18.9

## 2017-04-19 LAB — URINALYSIS, ROUTINE W REFLEX MICROSCOPIC
BILIRUBIN URINE: NEGATIVE
Glucose, UA: NEGATIVE mg/dL
HGB URINE DIPSTICK: NEGATIVE
KETONES UR: NEGATIVE mg/dL
Leukocytes, UA: NEGATIVE
NITRITE: NEGATIVE
PROTEIN: NEGATIVE mg/dL
SPECIFIC GRAVITY, URINE: 1.018 (ref 1.005–1.030)
pH: 5 (ref 5.0–8.0)

## 2017-04-19 LAB — COMPREHENSIVE METABOLIC PANEL
ALBUMIN: 3.9 g/dL (ref 3.5–5.0)
ALT: 13 U/L — ABNORMAL LOW (ref 17–63)
ANION GAP: 10 (ref 5–15)
AST: 20 U/L (ref 15–41)
Alkaline Phosphatase: 84 U/L (ref 38–126)
BUN: 28 mg/dL — AB (ref 6–20)
CO2: 19 mmol/L — AB (ref 22–32)
Calcium: 9.1 mg/dL (ref 8.9–10.3)
Chloride: 107 mmol/L (ref 101–111)
Creatinine, Ser: 2.11 mg/dL — ABNORMAL HIGH (ref 0.61–1.24)
GFR calc Af Amer: 31 mL/min — ABNORMAL LOW (ref >60)
GFR calc non Af Amer: 27 mL/min — ABNORMAL LOW (ref >60)
GLUCOSE: 144 mg/dL — AB (ref 65–99)
Potassium: 3.7 mmol/L (ref 3.5–5.1)
Sodium: 136 mmol/L (ref 135–145)
Total Bilirubin: 0.6 mg/dL (ref 0.3–1.2)
Total Protein: 7 g/dL (ref 6.5–8.1)

## 2017-04-19 LAB — CBC
HEMATOCRIT: 40.1 % (ref 39.0–52.0)
Hemoglobin: 13.1 g/dL (ref 13.0–17.0)
MCH: 30 pg (ref 26.0–34.0)
MCHC: 32.7 g/dL (ref 30.0–36.0)
MCV: 91.8 fL (ref 78.0–100.0)
Platelets: 186 10*3/uL (ref 150–400)
RBC: 4.37 MIL/uL (ref 4.22–5.81)
RDW: 13.9 % (ref 11.5–15.5)
WBC: 7.9 10*3/uL (ref 4.0–10.5)

## 2017-04-19 LAB — SURGICAL PCR SCREEN
MRSA, PCR: NEGATIVE
STAPHYLOCOCCUS AUREUS: NEGATIVE

## 2017-04-19 LAB — BLOOD GAS, ARTERIAL
Acid-base deficit: 2.3 mmol/L — ABNORMAL HIGH (ref 0.0–2.0)
BICARBONATE: 21.5 mmol/L (ref 20.0–28.0)
Drawn by: 470591
FIO2: 21
O2 Saturation: 96.5 %
PATIENT TEMPERATURE: 98.6
PH ART: 7.421 (ref 7.350–7.450)
pCO2 arterial: 33.7 mmHg (ref 32.0–48.0)
pO2, Arterial: 84.2 mmHg (ref 83.0–108.0)

## 2017-04-19 LAB — TYPE AND SCREEN
ABO/RH(D): O POS
ANTIBODY SCREEN: NEGATIVE

## 2017-04-19 LAB — APTT: APTT: 39 s — AB (ref 24–36)

## 2017-04-19 LAB — PROTIME-INR
INR: 1.07
Prothrombin Time: 13.9 seconds (ref 11.4–15.2)

## 2017-04-19 NOTE — Progress Notes (Signed)
Per Dr. Donnetta Hutching patient is to continue Aspirin.
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
## 2017-04-19 NOTE — Progress Notes (Addendum)
Anesthesia PAT Evaluation: Patient is a 83 year old male scheduled for abdominal aortic endovascular stent graft, revise right aortic expansion on 04/26/17 by Dr. Donnetta Hutching.  History includes smoking (since childhood; now smoking ~ 2-3 cig/day), AAA s/p EVAR complicated by RLE ischemia (with underlying PAD) s/p CFA endarterectomy 09/29/12, CAD s/p CABG X 6 (LIMA-LAD, SVG-DIAG, SVG-OM1-OM2, SVG-RCA-CX) 08/05/12, HTN, HLD, OSA (by 03/2015 sleep study; non-compliant with BiPAP), CKD stage III-IV, GERD, arthritis, CVA '12, ulcerative colitis, frequent falls, appendectomy. He was recently admitted for IV hydration (due to CKD) prior to 04/10/17 CTA.   PCP is Dr. Claris Gower. Nephrologist is Dr. Posey Pronto with [facility]. Records requested. Cardiologist is Dr. Mertie Moores, last visit Sep 10, 2013.   Meds include allopurinol, ASA 81 mg, Lipitor (recently d/c'd per PCP), Lexapro, Neurontin, Mucinex, Lopressor, Zantac, sodium bicarbonate, Flomax.  BP (!) 137/56   Pulse (!) 53   Temp 36.4 C (Oral)   Resp 20   Ht $R'5\' 7"'xB$  (1.702 m)   Wt 154 lb 12.2 oz (70.2 kg)   SpO2 96%   BMI 24.24 kg/m   Heart RRR, distant heart sounds. I did not appreciate a murmur. No pretibial edema. Lungs with fine right basilar crackles. No wheezes or rhonchi. Patient presents with his daughter Signa Kell [phone]). He is using a hospital wheelchair. He lives at home with his wife, both are have at least some degree of debilitation, the wife more so than the patient. He has a walker and cane at the house, but with non-compliant use and has a history of frequent falls, last yesterday. He denied syncope or dizziness, but says he has balance issues. He denied any serious injury from his falls. No LOC. He has some mild right sided symptoms since his CVA, but primarily reports RUE numbness. He denied chest pain. Overall he feels his breathing is stable, but is getting over a URI and has been taking Mucinex. He has an  intermittent productive cough. He hasn't really noticed much SOB, but daughter said she could tell a little more while recovering from URI but has noticed it improving as he also improves. No conversational dyspnea today. His wife had recent flu and PNA, but patient denied any recent PNA. He denied edema, palpitations/heart racing. He has days where he doesn't feel as well, but says he feels depressed as he is primarily home bound and reliant on others. His wife is also in poor health. He is on Zantac for heartburn when lying down (has occurred for ~ 1 year), but as above, otherwise no chest symptoms. No known CHF history. He is not compliant with BiPAP mainly due to having nocturia and difficulty with having to remove and replace BiPAP frequently during the night.   EKG 04/19/17: SB, first degree AV block, LAD, low voltage QRS, inferior infarct (age undetermined). Anterolateral T wave inversion (V1-4) with diffuse T wave flattening in other leads. T wave changes new or more pronounced since 09/10/2013 tracing.  Last cardiac cath was on 07/25/12 (PRE-CABG) and showed 3V CAD with well preserved LV function. He is s/p CABG X 6 08/05/12.  Carotid U/S 08/04/12: Summary: - No significant extracranial carotid artery stenosis demonstrated. Veterbrals are patent with antegrade flow.  CXR 04/19/17: IMPRESSION: No active cardiopulmonary disease.  CTA abd/pelvis 04/10/17: IMPRESSION: VASCULAR 1. Patent infrarenal stent graft with small endoleak of indeterminate etiology as above. There is a new right posterolateral saccular component with disrupted mural calcifications since 11/02/2014, native aneurysm transverse dimensions overall 9.6 x 7.4  cm, previously 6.1 x 5.6. 2. Proximal SMA stenosis of probable hemodynamic significance. Celiac axis remains widely patent. 3. Right renal artery stenosis of possible hemodynamic significance. 4. Mid left external iliac artery stenosis of possible  hemodynamic significance. 5. Mild stenosis in the right and external iliac artery. 6. Origin occlusion of the right SFA, incompletely visualized distally. 7. Occlusion in the mid left SFA, incompletely visualized distally. NON-VASCULAR 1. Bilateral nephrolithiasis without hydronephrosis. 2. Scattered colonic diverticula. 3. Mild L1 compression deformity, new since 11/02/2014. (Previous measurements by EVAR: 03/07/17: 6.44 x 6.82 cm, no mention of extra stent flow; 01/04/16: 4.95 x 5.10 cm.)  Preoperative labs noted. Cr 2.11 (which appears stable when compared to labs from 11/2013-03/2017). Glucose 144. CBC, UA, PT/INR WNL. PTT 39. T&S done. Prepare PRBC pending.   He has had rather rapid enlargement of his AAA and has been scheduled for EVAR/revise right aortic expansion on 04/26/17. Currently without any acute back pain. He has known CAD with CABG ~ 5 years ago. He denied any acute CV symptoms, but has not seen cardiology since 2014. EKG showed some subtle T wave changes since then. He is prone to falling and has been dealing with feeling depressed, but otherwise reports he has felt well from a cardiac standpoint. He denied chest pain. He does continue to smoke a few cigarettes a day as available. Discussed with anesthesiologist Dr. Therisa Doyne. Although large aneurysm size warrants timely intervention, at this point is not considered emergent so would at least recommend cardiology input prior to surgery given patient's history, EKG changes, and over due cardiac follow-up. Patient and daughter are aware. I have notified [facility] RN Zigmund Daniel. She will discuss with Dr. Donnetta Hutching.   George Hugh [facility] Short Stay Center/Anesthesiology Phone [phone] 04/19/2017 10:56 AM  Addendum: Nephrology records received. Last visit with Dr. Elmarie Shiley was on 01/17/17 for follow-up CKD stage IV primarily ischemic nephropathy with recent progression. Labs there showed BUN/Cr 22/1.88 and eGFR 32 (01/17/17), BUN/Cr  33/1.82 and eGRF 33 (08/27/16), and BUN/Cr 28/213 and eGFR 28 (04/09/16). EGFR remains consistent with CKD stage IV.   Cardiology notations from today by Leanor Kail, PA-C noted. Patient left after PAT, as he was not having any acute CV issues and surgery was not scheduled until 04/26/17. Apparently, Dr. Donnetta Hutching thought patient was waiting in PAT, so he asked cardiology to see today. Patient had already been gone for over an hour when I was called by cardiology, so they scheduled him an appointment on 04/23/17 at 11:00. Mr. Curly Shores notified patient's daughter [name], but she was not interested in taking her dad to see cardiology unless additional testing was going to be done. He was forwarding notes to Dr. Donnetta Hutching. I had previously discussed with Mr. Gerling and [name] that I was unsure if cardiology would require preoperative testing, but that often an office visit is needed to make that determination and that the expectation would be for Mr. Ragas to go to the appointment if deemed necessary. From our conversation, my take is that she is understandable starting to feel overwhelmed due to her parent's health issues and multiple appointments, but I am hoping that she is still willing to take him. His chart will be left for anesthesia follow-up.   George Hugh [facility] Short Stay Center/Anesthesiology Phone [phone] 04/19/2017 5:09 PM  Addendum:   Pt saw Domenic Moras, PA with cardiology 04/23/17.  Stress test and echo ordered.  Results below.  Pt cleared for surgery.  Nuclear stress test 04/25/17: 1. EF 53%, no regional wall motion abnormalities noted.  2. Fixed small, mild basal inferolateral perfusion defect.  Given normal wall motion, this may represent attenuation.  No evidence for significant ischemia.  Low risk study.   Echo 04/23/17:  - Left ventricle: The cavity size was normal. There was mild concentric hypertrophy. Systolic function was normal. Wall motion was normal;  there were no regional wall motion abnormalities. Doppler parameters are consistent with abnormal left ventricular relaxation (grade 1 diastolic dysfunction). Doppler parameters are consistent with indeterminate ventricular filling pressure. - Aortic valve: Transvalvular velocity was within the normal range. There was no stenosis. There was mild regurgitation. Regurgitation pressure half-time: 593 ms. - Mitral valve: Transvalvular velocity was within the normal range. There was no evidence for stenosis. There was trivial regurgitation. - Left atrium: The atrium was mildly dilated. - Right ventricle: The cavity size was normal. Wall thickness was normal. Systolic function was mildly reduced. - Tricuspid valve: There was mild regurgitation. - Pulmonary arteries: Systolic pressure was within the normal range. PA peak pressure: 29 mm Hg (S).  If no changes, I anticipate pt can proceed with surgery as scheduled.   Willeen Cass, FNP-BC [facility] Short Stay Surgical Center/Anesthesiology Phone: [phone] 04/25/2017 1:32 PM

## 2017-04-19 NOTE — Progress Notes (Addendum)
Requested consult from Dr. Donnetta Hutching for pre-op cardiac clearance. By the time, I went to see the patient, he was already left. The patient is scheduled to see me in clinic Tuesday 04/23/17 @ 11am. Confirmed appointment with daughter.  However daughter is amendment about why her dad need to see cardiologist. She is asking of plan. If not testing, why the appointment needed.   Explained that patient hasn't seen by Dr. Acie Fredrickson since 07/2013. We need risk stratifications before clearance. Might need testing or not. IF needed testing, we can expedite before the surgery. However, she wants to know plan over the phone that can not be determined before examining and seeing the patient. Advised to direct further questions to Dr. Donnetta Hutching. Seems she is not interested in cardiology appointment. Will forward note to Dr. Donnetta Hutching.

## 2017-04-19 NOTE — Progress Notes (Addendum)
PCP - wilson oliver Elkins Cardiologist - Cathie Olden - has not seen in years  Chest x-ray - 04/19/17 - patient has a cough and congestion today EKG - 04/19/17 Stress Test - 07/17/12 ECHO - not sure Cardiac Cath - 08/05/12  Sleep Study - 05/11/15 CPAP - patient has a BIpap at home but refuses to wear it  Patient is very weak with history of multiple falls.  Patient fell yesterday into a wall.  He has an extensive heart history will send to anesthesia for review of records  Patient is to continue aspirin   Patient denies , fever,  and chest pain at PAT appointment  Positive for shortness of breath and cough at PAT appointemnt   Patient verbalized understanding of instructions that were given to them at the PAT appointment. Patient was also instructed that they will need to review over the PAT instructions again at home before surgery.

## 2017-04-19 NOTE — Pre-Procedure Instructions (Signed)
[place]  04/19/2017      [place], [address] [place] [address] [place] [address] Phone: [phone] Fax: [phone]  [id number] [place], [place] [address] Phone: [phone] Fax: [phone]    Your procedure is scheduled on Friday June 1.  Report to [facility] Admitting at 8:00 A.M.  Call this number if you have problems the morning of surgery:  [phone]   Remember:  Do not eat food or drink liquids after midnight.  Take these medicines the morning of surgery with A SIP OF WATER: allopurinol (zyloprim), metoprolol (lopressor), acetaminophen (tylenol) if needed  Follow MD's instructions on stopping Aspirin  7 days prior to surgery STOP taking any Aleve, Naproxen, Ibuprofen, Motrin, Advil, Goody's, BC's, all herbal medications, fish oil, and all vitamins    Do not wear jewelry, make-up or nail polish.  Do not wear lotions, powders, or perfumes, or deoderant.  Do not shave 48 hours prior to surgery.  Men may shave face and neck.  Do not bring valuables to the hospital.  [facility] is not responsible for any belongings or valuables.  Contacts, dentures or bridgework may not be worn into surgery.  Leave your suitcase in the car.  After surgery it may be brought to your room.  For patients admitted to the hospital, discharge time will be determined by your treatment team.  Patients discharged the day of surgery will not be allowed to drive home.    Special instructions:    [place]- Preparing For Surgery  Before surgery, you can play an important role. Because skin is not sterile, your skin needs to be as free of germs as possible. You can reduce the number of germs on your skin by washing with CHG (chlorahexidine gluconate) Soap before surgery.  CHG is an antiseptic cleaner which kills germs and bonds with the  skin to continue killing germs even after washing.  Please do not use if you have an allergy to CHG or antibacterial soaps. If your skin becomes reddened/irritated stop using the CHG.  Do not shave (including legs and underarms) for at least 48 hours prior to first CHG shower. It is OK to shave your face.  Please follow these instructions carefully.   1. Shower the NIGHT BEFORE SURGERY and the MORNING OF SURGERY with CHG.   2. If you chose to wash your hair, wash your hair first as usual with your normal shampoo.  3. After you shampoo, rinse your hair and body thoroughly to remove the shampoo.  4. Use CHG as you would any other liquid soap. You can apply CHG directly to the skin and wash gently with a scrungie or a clean washcloth.   5. Apply the CHG Soap to your body ONLY FROM THE NECK DOWN.  Do not use on open wounds or open sores. Avoid contact with your eyes, ears, mouth and genitals (private parts). Wash genitals (private parts) with your normal soap.  6. Wash thoroughly, paying special attention to the area where your surgery will be performed.  7. Thoroughly rinse your body with warm water from the neck down.  8. DO NOT shower/wash with your normal soap after using and rinsing off the CHG Soap.  9. Pat yourself dry with a CLEAN TOWEL.   10. Wear CLEAN PAJAMAS   11. Place CLEAN SHEETS on your  bed the night of your first shower and DO NOT SLEEP WITH PETS.    Day of Surgery: Do not apply any deodorants/lotions. Please wear clean clothes to the hospital/surgery center.      Please read over the following fact sheets that you were given. MRSA Information

## 2017-04-20 ENCOUNTER — Telehealth: Payer: Self-pay | Admitting: Vascular Surgery

## 2017-04-20 NOTE — Telephone Encounter (Signed)
Had a phone discussion with the patient's daughter. He had been seen in preadmission testing yesterday and recommendation was for him to see his cardiologist. He had been released by his cardiologist in 2014 since he had not had any new difficulty. Patient's daughter was concerned regarding the ability to get him to multiple different appointments. I did explain that it is safest for him to have evaluation assure that no other optimization needs to be done prior to the stent graft extension. Also discussed again the procedure of placement of a extension in the right limb. Also explained that his renal function has remained stable following the CT angiogram. For cardiac outpatient visit: 5/29 and planning extension of the right limb of the stent graft on 6/1 with plan for discharge the following day

## 2017-04-23 ENCOUNTER — Ambulatory Visit (HOSPITAL_BASED_OUTPATIENT_CLINIC_OR_DEPARTMENT_OTHER): Payer: Medicare Other

## 2017-04-23 ENCOUNTER — Encounter: Payer: Self-pay | Admitting: Physician Assistant

## 2017-04-23 ENCOUNTER — Encounter (INDEPENDENT_AMBULATORY_CARE_PROVIDER_SITE_OTHER): Payer: Self-pay

## 2017-04-23 ENCOUNTER — Encounter (HOSPITAL_COMMUNITY): Payer: Self-pay | Admitting: *Deleted

## 2017-04-23 ENCOUNTER — Ambulatory Visit (INDEPENDENT_AMBULATORY_CARE_PROVIDER_SITE_OTHER): Payer: Medicare Other | Admitting: Physician Assistant

## 2017-04-23 VITALS — BP 124/80 | HR 50 | Ht 67.0 in | Wt 153.1 lb

## 2017-04-23 DIAGNOSIS — Z951 Presence of aortocoronary bypass graft: Secondary | ICD-10-CM

## 2017-04-23 DIAGNOSIS — I714 Abdominal aortic aneurysm, without rupture, unspecified: Secondary | ICD-10-CM

## 2017-04-23 DIAGNOSIS — R9431 Abnormal electrocardiogram [ECG] [EKG]: Secondary | ICD-10-CM

## 2017-04-23 DIAGNOSIS — E785 Hyperlipidemia, unspecified: Secondary | ICD-10-CM | POA: Diagnosis not present

## 2017-04-23 DIAGNOSIS — I082 Rheumatic disorders of both aortic and tricuspid valves: Secondary | ICD-10-CM | POA: Insufficient documentation

## 2017-04-23 DIAGNOSIS — Z01810 Encounter for preprocedural cardiovascular examination: Secondary | ICD-10-CM

## 2017-04-23 DIAGNOSIS — Z720 Tobacco use: Secondary | ICD-10-CM | POA: Insufficient documentation

## 2017-04-23 LAB — ECHOCARDIOGRAM COMPLETE
Height: 67 in
WEIGHTICAEL: 2449.92 [oz_av]

## 2017-04-23 NOTE — Progress Notes (Signed)
Patient given detailed instructions per Myocardial Perfusion Study Information Sheet for the test on 04/24/17 at 0945. Patient notified to arrive 15 minutes early and that it is imperative to arrive on time for appointment to keep from having the test rescheduled.  If you need to cancel or reschedule your appointment, please call the office within 24 hours of your appointment. . Patient verbalized understanding.Andren Bethea, Ranae Palms

## 2017-04-23 NOTE — Patient Instructions (Addendum)
Medication Instructions:   Your physician recommends that you continue on your current medications as directed. Please refer to the Current Medication list given to you today.    If you need a refill on your cardiac medications before your next appointment, please call your pharmacy.  Labwork:  RETURN FOR FASTING LIPID AND LIVER  TOMORROW 04-24-17     Testing/Procedures: TODAY  1130 OKAY PER WILL .Marland KitchenYour physician has requested that you have an echocardiogram. Echocardiography is a painless test that uses sound waves to create images of your heart. It provides your doctor with information about the size and shape of your heart and how well your heart's chambers and valves are working. This procedure takes approximately one hour. There are no restrictions for this procedure.   TOMORROW 04-24-17 AT 10 AM Your physician has requested that you have a lexiscan myoview. For further information please visit [email or web address]. Please follow instruction sheet, as given.    Follow-Up: BASED UPON RESULTS   Any Other Special Instructions Will Be Listed Below (If Applicable).

## 2017-04-23 NOTE — Addendum Note (Signed)
Addended by: Claude Manges on: 04/23/2017 12:04 PM   Modules accepted: Orders

## 2017-04-23 NOTE — Addendum Note (Signed)
Addended by: Claude Manges on: 04/23/2017 01:25 PM   Modules accepted: Orders

## 2017-04-24 ENCOUNTER — Other Ambulatory Visit: Payer: Medicare Other

## 2017-04-24 ENCOUNTER — Ambulatory Visit: Payer: Medicare Other | Admitting: Vascular Surgery

## 2017-04-24 ENCOUNTER — Other Ambulatory Visit: Payer: Medicare Other | Admitting: *Deleted

## 2017-04-24 ENCOUNTER — Ambulatory Visit (HOSPITAL_BASED_OUTPATIENT_CLINIC_OR_DEPARTMENT_OTHER): Payer: Medicare Other

## 2017-04-24 DIAGNOSIS — I2510 Atherosclerotic heart disease of native coronary artery without angina pectoris: Secondary | ICD-10-CM

## 2017-04-24 DIAGNOSIS — Z951 Presence of aortocoronary bypass graft: Secondary | ICD-10-CM | POA: Diagnosis not present

## 2017-04-24 DIAGNOSIS — E785 Hyperlipidemia, unspecified: Secondary | ICD-10-CM

## 2017-04-24 LAB — LIPID PANEL
Chol/HDL Ratio: 4.5 ratio (ref 0.0–5.0)
Cholesterol, Total: 163 mg/dL (ref 100–199)
HDL: 36 mg/dL — ABNORMAL LOW (ref >39)
LDL CALC: 89 mg/dL (ref 0–99)
TRIGLYCERIDES: 189 mg/dL — AB (ref 0–149)
VLDL Cholesterol Cal: 38 mg/dL (ref 5–40)

## 2017-04-24 LAB — MYOCARDIAL PERFUSION IMAGING
CSEPPHR: 58 {beats}/min
LV dias vol: 84 mL (ref 62–150)
LV sys vol: 39 mL
RATE: 0.26
Rest HR: 48 {beats}/min
SDS: 2
SRS: 3
SSS: 5
TID: 0.95

## 2017-04-24 LAB — HEPATIC FUNCTION PANEL
ALT: 11 IU/L (ref 0–44)
AST: 19 IU/L (ref 0–40)
Albumin: 4.2 g/dL (ref 3.5–4.7)
Alkaline Phosphatase: 98 IU/L (ref 39–117)
BILIRUBIN TOTAL: 0.3 mg/dL (ref 0.0–1.2)
BILIRUBIN, DIRECT: 0.08 mg/dL (ref 0.00–0.40)
Total Protein: 6.7 g/dL (ref 6.0–8.5)

## 2017-04-24 MED ORDER — TECHNETIUM TC 99M TETROFOSMIN IV KIT
10.1000 | PACK | Freq: Once | INTRAVENOUS | Status: AC | PRN
  Administered 2017-04-24: 10.1 via INTRAVENOUS
  Filled 2017-04-24: qty 11

## 2017-04-24 MED ORDER — TECHNETIUM TC 99M TETROFOSMIN IV KIT
33.0000 | PACK | Freq: Once | INTRAVENOUS | Status: AC | PRN
  Administered 2017-04-24: 33 via INTRAVENOUS
  Filled 2017-04-24: qty 33

## 2017-04-24 MED ORDER — REGADENOSON 0.4 MG/5ML IV SOLN
0.4000 mg | Freq: Once | INTRAVENOUS | Status: AC
Start: 2017-04-24 — End: 2017-04-24
  Administered 2017-04-24: 0.4 mg via INTRAVENOUS

## 2017-04-26 ENCOUNTER — Encounter (HOSPITAL_COMMUNITY): Payer: Self-pay

## 2017-04-26 ENCOUNTER — Inpatient Hospital Stay (HOSPITAL_COMMUNITY): Payer: Medicare Other | Admitting: Critical Care Medicine

## 2017-04-26 ENCOUNTER — Inpatient Hospital Stay (HOSPITAL_COMMUNITY): Payer: Medicare Other

## 2017-04-26 ENCOUNTER — Encounter (HOSPITAL_COMMUNITY)
Admission: RE | Disposition: A | Discharge status: Home / Self Care | Payer: Self-pay | Source: Ambulatory Visit | Attending: Vascular Surgery

## 2017-04-26 ENCOUNTER — Inpatient Hospital Stay (HOSPITAL_COMMUNITY)
Admission: RE | Admit: 2017-04-26 | Discharge: 2017-04-27 | DRG: 269 | Disposition: A | Discharge status: Home / Self Care | Payer: Medicare Other | Source: Ambulatory Visit | Attending: Vascular Surgery | Admitting: Vascular Surgery

## 2017-04-26 ENCOUNTER — Inpatient Hospital Stay (HOSPITAL_COMMUNITY): Payer: Medicare Other | Admitting: Vascular Surgery

## 2017-04-26 DIAGNOSIS — I2510 Atherosclerotic heart disease of native coronary artery without angina pectoris: Secondary | ICD-10-CM | POA: Diagnosis present

## 2017-04-26 DIAGNOSIS — N184 Chronic kidney disease, stage 4 (severe): Secondary | ICD-10-CM | POA: Diagnosis present

## 2017-04-26 DIAGNOSIS — Z79899 Other long term (current) drug therapy: Secondary | ICD-10-CM

## 2017-04-26 DIAGNOSIS — N390 Urinary tract infection, site not specified: Secondary | ICD-10-CM | POA: Diagnosis present

## 2017-04-26 DIAGNOSIS — Z951 Presence of aortocoronary bypass graft: Secondary | ICD-10-CM

## 2017-04-26 DIAGNOSIS — M109 Gout, unspecified: Secondary | ICD-10-CM | POA: Diagnosis present

## 2017-04-26 DIAGNOSIS — R296 Repeated falls: Secondary | ICD-10-CM | POA: Diagnosis present

## 2017-04-26 DIAGNOSIS — I714 Abdominal aortic aneurysm, without rupture, unspecified: Secondary | ICD-10-CM | POA: Diagnosis present

## 2017-04-26 DIAGNOSIS — I771 Stricture of artery: Secondary | ICD-10-CM | POA: Diagnosis present

## 2017-04-26 DIAGNOSIS — Z7982 Long term (current) use of aspirin: Secondary | ICD-10-CM | POA: Diagnosis not present

## 2017-04-26 DIAGNOSIS — Z87891 Personal history of nicotine dependence: Secondary | ICD-10-CM

## 2017-04-26 DIAGNOSIS — Z8673 Personal history of transient ischemic attack (TIA), and cerebral infarction without residual deficits: Secondary | ICD-10-CM

## 2017-04-26 DIAGNOSIS — K219 Gastro-esophageal reflux disease without esophagitis: Secondary | ICD-10-CM | POA: Diagnosis present

## 2017-04-26 DIAGNOSIS — I129 Hypertensive chronic kidney disease with stage 1 through stage 4 chronic kidney disease, or unspecified chronic kidney disease: Secondary | ICD-10-CM | POA: Diagnosis present

## 2017-04-26 DIAGNOSIS — E785 Hyperlipidemia, unspecified: Secondary | ICD-10-CM | POA: Diagnosis present

## 2017-04-26 DIAGNOSIS — Z8679 Personal history of other diseases of the circulatory system: Secondary | ICD-10-CM

## 2017-04-26 DIAGNOSIS — Z9889 Other specified postprocedural states: Secondary | ICD-10-CM

## 2017-04-26 HISTORY — PX: ABDOMINAL AORTIC ENDOVASCULAR STENT GRAFT: SHX5707

## 2017-04-26 LAB — BASIC METABOLIC PANEL
Anion gap: 6 (ref 5–15)
BUN: 24 mg/dL — AB (ref 6–20)
CALCIUM: 8.6 mg/dL — AB (ref 8.9–10.3)
CO2: 25 mmol/L (ref 22–32)
CREATININE: 1.86 mg/dL — AB (ref 0.61–1.24)
Chloride: 106 mmol/L (ref 101–111)
GFR calc non Af Amer: 31 mL/min — ABNORMAL LOW (ref >60)
GFR, EST AFRICAN AMERICAN: 36 mL/min — AB (ref >60)
Glucose, Bld: 102 mg/dL — ABNORMAL HIGH (ref 65–99)
Potassium: 4.5 mmol/L (ref 3.5–5.1)
Sodium: 137 mmol/L (ref 135–145)

## 2017-04-26 LAB — CBC
HEMATOCRIT: 36.6 % — AB (ref 39.0–52.0)
Hemoglobin: 11.8 g/dL — ABNORMAL LOW (ref 13.0–17.0)
MCH: 29.4 pg (ref 26.0–34.0)
MCHC: 32.2 g/dL (ref 30.0–36.0)
MCV: 91.3 fL (ref 78.0–100.0)
Platelets: 159 10*3/uL (ref 150–400)
RBC: 4.01 MIL/uL — ABNORMAL LOW (ref 4.22–5.81)
RDW: 13.7 % (ref 11.5–15.5)
WBC: 7.2 10*3/uL (ref 4.0–10.5)

## 2017-04-26 LAB — URINALYSIS, COMPLETE (UACMP) WITH MICROSCOPIC
BILIRUBIN URINE: NEGATIVE
Glucose, UA: NEGATIVE mg/dL
Ketones, ur: NEGATIVE mg/dL
NITRITE: POSITIVE — AB
PROTEIN: 100 mg/dL — AB
SQUAMOUS EPITHELIAL / LPF: NONE SEEN
Specific Gravity, Urine: 1.015 (ref 1.005–1.030)
pH: 7 (ref 5.0–8.0)

## 2017-04-26 LAB — PROTIME-INR
INR: 1.18
Prothrombin Time: 15 seconds (ref 11.4–15.2)

## 2017-04-26 LAB — APTT: aPTT: 41 seconds — ABNORMAL HIGH (ref 24–36)

## 2017-04-26 LAB — MAGNESIUM: Magnesium: 1.7 mg/dL (ref 1.7–2.4)

## 2017-04-26 SURGERY — INSERTION, ENDOVASCULAR STENT GRAFT, AORTA, ABDOMINAL
Anesthesia: General | Site: Abdomen | Laterality: Right

## 2017-04-26 MED ORDER — HEPARIN SODIUM (PORCINE) 5000 UNIT/ML IJ SOLN
INTRAMUSCULAR | Status: DC | PRN
  Administered 2017-04-26: 11:00:00

## 2017-04-26 MED ORDER — ONDANSETRON HCL 4 MG/2ML IJ SOLN: 4.0000 mg | Freq: Once | INTRAMUSCULAR | Status: DC | PRN

## 2017-04-26 MED ORDER — CEFUROXIME SODIUM 1.5 G IV SOLR
1.5000 g | Freq: Two times a day (BID) | INTRAVENOUS | Status: AC
  Administered 2017-04-26 – 2017-04-27 (×2): 1.5 g via INTRAVENOUS
  Filled 2017-04-26 (×2): qty 1.5

## 2017-04-26 MED ORDER — MORPHINE SULFATE (PF) 2 MG/ML IV SOLN: 1.0000 mg | INTRAVENOUS | Status: DC | PRN

## 2017-04-26 MED ORDER — MAGNESIUM SULFATE 2 GM/50ML IV SOLN: 2.0000 g | Freq: Every day | INTRAVENOUS | Status: DC | PRN

## 2017-04-26 MED ORDER — DEXTROSE 5 % IV SOLN
1.5000 g | INTRAVENOUS | Status: AC
  Administered 2017-04-26: 1.5 g via INTRAVENOUS
  Filled 2017-04-26: qty 1.5

## 2017-04-26 MED ORDER — OXYCODONE-ACETAMINOPHEN 5-325 MG PO TABS: 1.0000 | ORAL_TABLET | ORAL | Status: DC | PRN

## 2017-04-26 MED ORDER — ASPIRIN EC 81 MG PO TBEC
81.0000 mg | DELAYED_RELEASE_TABLET | Freq: Every day | ORAL | Status: DC
  Administered 2017-04-26: 81 mg via ORAL
  Filled 2017-04-26 (×2): qty 1

## 2017-04-26 MED ORDER — ALUM & MAG HYDROXIDE-SIMETH 200-200-20 MG/5ML PO SUSP: 15.0000 mL | ORAL | Status: DC | PRN

## 2017-04-26 MED ORDER — ROCURONIUM BROMIDE 100 MG/10ML IV SOLN
INTRAVENOUS | Status: DC | PRN
  Administered 2017-04-26: 50 mg via INTRAVENOUS

## 2017-04-26 MED ORDER — SODIUM CHLORIDE 0.9 % IV SOLN
INTRAVENOUS | Status: DC
  Administered 2017-04-26: 19:00:00 via INTRAVENOUS

## 2017-04-26 MED ORDER — CHLORHEXIDINE GLUCONATE 4 % EX LIQD: 60.0000 mL | Freq: Once | CUTANEOUS | Status: DC

## 2017-04-26 MED ORDER — METOPROLOL TARTRATE 5 MG/5ML IV SOLN: 2.0000 mg | INTRAVENOUS | Status: DC | PRN

## 2017-04-26 MED ORDER — ADULT MULTIVITAMIN W/MINERALS CH
1.0000 | ORAL_TABLET | Freq: Every day | ORAL | Status: DC
  Administered 2017-04-26: 1 via ORAL
  Filled 2017-04-26 (×2): qty 1

## 2017-04-26 MED ORDER — 0.9 % SODIUM CHLORIDE (POUR BTL) OPTIME
TOPICAL | Status: DC | PRN
  Administered 2017-04-26: 1000 mL

## 2017-04-26 MED ORDER — FENTANYL CITRATE (PF) 250 MCG/5ML IJ SOLN
INTRAMUSCULAR | Status: AC
  Filled 2017-04-26: qty 5

## 2017-04-26 MED ORDER — SODIUM CHLORIDE 0.9 % IV SOLN: INTRAVENOUS | Status: DC

## 2017-04-26 MED ORDER — NEOSTIGMINE METHYLSULFATE 10 MG/10ML IV SOLN
INTRAVENOUS | Status: DC | PRN
  Administered 2017-04-26: 3 mg via INTRAVENOUS

## 2017-04-26 MED ORDER — LIDOCAINE HCL (CARDIAC) 20 MG/ML IV SOLN
INTRAVENOUS | Status: DC | PRN
  Administered 2017-04-26: 60 mg via INTRAVENOUS

## 2017-04-26 MED ORDER — SODIUM BICARBONATE 650 MG PO TABS
650.0000 mg | ORAL_TABLET | Freq: Two times a day (BID) | ORAL | Status: DC
Start: 2017-04-26 — End: 2017-04-27
  Administered 2017-04-26: 650 mg via ORAL
  Filled 2017-04-26 (×2): qty 1

## 2017-04-26 MED ORDER — ACETAMINOPHEN 500 MG PO TABS: 1000.0000 mg | ORAL_TABLET | Freq: Three times a day (TID) | ORAL | Status: DC | PRN

## 2017-04-26 MED ORDER — SODIUM CHLORIDE 0.9 % IV SOLN: 500.0000 mL | Freq: Once | INTRAVENOUS | Status: DC | PRN

## 2017-04-26 MED ORDER — LACTATED RINGERS IV SOLN
INTRAVENOUS | Status: DC
  Administered 2017-04-26 (×2): via INTRAVENOUS

## 2017-04-26 MED ORDER — PHENYLEPHRINE HCL 10 MG/ML IJ SOLN
INTRAVENOUS | Status: DC | PRN
  Administered 2017-04-26: 15 ug/min via INTRAVENOUS

## 2017-04-26 MED ORDER — VITAMIN D 1000 UNITS PO TABS
1000.0000 [IU] | ORAL_TABLET | Freq: Every day | ORAL | Status: DC
  Administered 2017-04-26: 1000 [IU] via ORAL
  Filled 2017-04-26 (×2): qty 1

## 2017-04-26 MED ORDER — PROPOFOL 10 MG/ML IV BOLUS
INTRAVENOUS | Status: DC | PRN
  Administered 2017-04-26: 100 mg via INTRAVENOUS

## 2017-04-26 MED ORDER — LABETALOL HCL 5 MG/ML IV SOLN: 10.0000 mg | INTRAVENOUS | Status: DC | PRN

## 2017-04-26 MED ORDER — LACTATED RINGERS IV SOLN
INTRAVENOUS | Status: DC | PRN
Start: 2017-04-26 — End: 2017-04-26
  Administered 2017-04-26: 11:00:00 via INTRAVENOUS

## 2017-04-26 MED ORDER — GLYCOPYRROLATE 0.2 MG/ML IJ SOLN
INTRAMUSCULAR | Status: DC | PRN
  Administered 2017-04-26: 0.6 mg via INTRAVENOUS

## 2017-04-26 MED ORDER — GABAPENTIN 100 MG PO CAPS
100.0000 mg | ORAL_CAPSULE | Freq: Every day | ORAL | Status: DC
  Administered 2017-04-26: 100 mg via ORAL
  Filled 2017-04-26: qty 1

## 2017-04-26 MED ORDER — ENOXAPARIN SODIUM 30 MG/0.3ML ~~LOC~~ SOLN: 30.0000 mg | SUBCUTANEOUS | Status: DC

## 2017-04-26 MED ORDER — DOCUSATE SODIUM 100 MG PO CAPS
100.0000 mg | ORAL_CAPSULE | Freq: Every day | ORAL | Status: DC
  Filled 2017-04-26: qty 1

## 2017-04-26 MED ORDER — HYDRALAZINE HCL 20 MG/ML IJ SOLN: 5.0000 mg | INTRAMUSCULAR | Status: DC | PRN

## 2017-04-26 MED ORDER — FENTANYL CITRATE (PF) 100 MCG/2ML IJ SOLN: 25.0000 ug | INTRAMUSCULAR | Status: DC | PRN

## 2017-04-26 MED ORDER — TAMSULOSIN HCL 0.4 MG PO CAPS
0.4000 mg | ORAL_CAPSULE | Freq: Every day | ORAL | Status: DC
  Administered 2017-04-26: 0.4 mg via ORAL
  Filled 2017-04-26: qty 1

## 2017-04-26 MED ORDER — ESCITALOPRAM OXALATE 20 MG PO TABS
20.0000 mg | ORAL_TABLET | Freq: Every day | ORAL | Status: DC
  Administered 2017-04-26: 20 mg via ORAL
  Filled 2017-04-26: qty 1

## 2017-04-26 MED ORDER — ALLOPURINOL 100 MG PO TABS: 200.0000 mg | ORAL_TABLET | Freq: Every day | ORAL | Status: DC

## 2017-04-26 MED ORDER — ONDANSETRON HCL 4 MG/2ML IJ SOLN
INTRAMUSCULAR | Status: DC | PRN
  Administered 2017-04-26: 4 mg via INTRAVENOUS

## 2017-04-26 MED ORDER — FENTANYL CITRATE (PF) 100 MCG/2ML IJ SOLN
INTRAMUSCULAR | Status: DC | PRN
  Administered 2017-04-26: 100 ug via INTRAVENOUS

## 2017-04-26 MED ORDER — GUAIFENESIN ER 600 MG PO TB12: 600.0000 mg | ORAL_TABLET | Freq: Every day | ORAL | Status: DC | PRN

## 2017-04-26 MED ORDER — PHENOL 1.4 % MT LIQD: 1.0000 | OROMUCOSAL | Status: DC | PRN

## 2017-04-26 MED ORDER — IODIXANOL 320 MG/ML IV SOLN
INTRAVENOUS | Status: DC | PRN
  Administered 2017-04-26: 150 mL via INTRA_ARTERIAL

## 2017-04-26 MED ORDER — ONDANSETRON HCL 4 MG/2ML IJ SOLN: 4.0000 mg | Freq: Four times a day (QID) | INTRAMUSCULAR | Status: DC | PRN

## 2017-04-26 MED ORDER — METOPROLOL TARTRATE 100 MG PO TABS
200.0000 mg | ORAL_TABLET | Freq: Every day | ORAL | Status: DC
  Administered 2017-04-27: 200 mg via ORAL
  Filled 2017-04-26 (×2): qty 2

## 2017-04-26 MED ORDER — FAMOTIDINE 20 MG PO TABS
20.0000 mg | ORAL_TABLET | Freq: Two times a day (BID) | ORAL | Status: DC
  Administered 2017-04-26: 20 mg via ORAL
  Filled 2017-04-26 (×2): qty 1

## 2017-04-26 MED ORDER — POTASSIUM CHLORIDE CRYS ER 20 MEQ PO TBCR: 20.0000 meq | EXTENDED_RELEASE_TABLET | Freq: Every day | ORAL | Status: DC | PRN

## 2017-04-26 MED ORDER — PANTOPRAZOLE SODIUM 40 MG PO TBEC
40.0000 mg | DELAYED_RELEASE_TABLET | Freq: Every day | ORAL | Status: DC
  Administered 2017-04-26: 40 mg via ORAL
  Filled 2017-04-26 (×2): qty 1

## 2017-04-26 SURGICAL SUPPLY — 49 items
CANISTER SUCT 3000ML PPV (MISCELLANEOUS) ×2 IMPLANT
CATH ANGIO 5F BER2 65CM (CATHETERS) ×2 IMPLANT
CATH BEACON 5.038 65CM KMP-01 (CATHETERS) IMPLANT
CATH OMNI FLUSH .035X70CM (CATHETERS) ×2 IMPLANT
CLIP LIGATING EXTRA MED SLVR (CLIP) IMPLANT
CLIP LIGATING EXTRA SM BLUE (MISCELLANEOUS) IMPLANT
COVER MAYO STAND STRL (DRAPES) ×2 IMPLANT
COVER PROBE W GEL 5X96 (DRAPES) ×2 IMPLANT
DERMABOND ADVANCED (GAUZE/BANDAGES/DRESSINGS) ×1
DERMABOND ADVANCED .7 DNX12 (GAUZE/BANDAGES/DRESSINGS) ×1 IMPLANT
DEVICE CLOSURE PERCLS PRGLD 6F (VASCULAR PRODUCTS) ×2 IMPLANT
DRSG TEGADERM 2-3/8X2-3/4 SM (GAUZE/BANDAGES/DRESSINGS) ×4 IMPLANT
ELECT CAUTERY BLADE 6.4 (BLADE) ×2 IMPLANT
ELECT REM PT RETURN 9FT ADLT (ELECTROSURGICAL) ×4
ELECTRODE REM PT RTRN 9FT ADLT (ELECTROSURGICAL) ×2 IMPLANT
GLOVE BIOGEL PI IND STRL 6.5 (GLOVE) ×2 IMPLANT
GLOVE BIOGEL PI INDICATOR 6.5 (GLOVE) ×2
GLOVE SS BIOGEL STRL SZ 7.5 (GLOVE) ×1 IMPLANT
GLOVE SUPERSENSE BIOGEL SZ 7.5 (GLOVE) ×1
GOWN STRL NON-REIN LRG LVL3 (GOWN DISPOSABLE) ×2 IMPLANT
GOWN STRL REUS W/ TWL LRG LVL3 (GOWN DISPOSABLE) ×3 IMPLANT
GOWN STRL REUS W/TWL LRG LVL3 (GOWN DISPOSABLE) ×3
GRAFT BALLN CATH 65CM (STENTS) ×1 IMPLANT
KIT BASIN OR (CUSTOM PROCEDURE TRAY) ×2 IMPLANT
KIT ROOM TURNOVER OR (KITS) ×2 IMPLANT
LEG CONTRALATERAL 16X20X9.5 (Endovascular Graft) ×1 IMPLANT
NEEDLE PERC 18GX7CM (NEEDLE) ×2 IMPLANT
NS IRRIG 1000ML POUR BTL (IV SOLUTION) ×2 IMPLANT
PACK ENDOVASCULAR (PACKS) ×2 IMPLANT
PAD ARMBOARD 7.5X6 YLW CONV (MISCELLANEOUS) ×4 IMPLANT
PERCLOSE PROGLIDE 6F (VASCULAR PRODUCTS) ×4
SHEATH AVANTI 11CM 8FR (MISCELLANEOUS) ×2 IMPLANT
SHEATH BRITE TIP 8FR 23CM (MISCELLANEOUS) IMPLANT
STAPLER VISISTAT 35W (STAPLE) IMPLANT
STENT GRAFT BALLN CATH 65CM (STENTS) ×1
STENT GRAFT CONTRALAT 16X20X9. (Endovascular Graft) ×1 IMPLANT
STOPCOCK MORSE 400PSI 3WAY (MISCELLANEOUS) ×2 IMPLANT
SUT ETHILON 3 0 PS 1 (SUTURE) IMPLANT
SUT PROLENE 5 0 C 1 24 (SUTURE) IMPLANT
SUT VIC AB 2-0 CTX 36 (SUTURE) IMPLANT
SUT VIC AB 3-0 SH 18 (SUTURE) IMPLANT
SUT VIC AB 3-0 SH 27 (SUTURE)
SUT VIC AB 3-0 SH 27X BRD (SUTURE) IMPLANT
SUT VICRYL 4-0 PS2 18IN ABS (SUTURE) ×2 IMPLANT
SYR 30ML LL (SYRINGE) ×4 IMPLANT
TRAY FOLEY W/METER SILVER 16FR (SET/KITS/TRAYS/PACK) ×2 IMPLANT
TUBING HIGH PRESSURE 120CM (CONNECTOR) ×2 IMPLANT
WIRE AMPLATZ SS-J .035X180CM (WIRE) ×2 IMPLANT
WIRE BENTSON .035X145CM (WIRE) ×2 IMPLANT

## 2017-04-26 NOTE — Anesthesia Preprocedure Evaluation (Addendum)
Anesthesia Evaluation  Patient identified by MRN, date of birth, ID band Patient awake    Reviewed: Allergy & Precautions, NPO status , Patient's Chart, lab work & pertinent test results  History of Anesthesia Complications Negative for: history of anesthetic complications  Airway Mallampati: IV  TM Distance: >3 FB Neck ROM: Full    Dental no notable dental hx. (+)    Pulmonary Current Smoker (1/5 ppd),    Pulmonary exam normal breath sounds clear to auscultation       Cardiovascular hypertension, Pt. on home beta blockers + CAD and + CABG  Normal cardiovascular exam Rhythm:Regular Rate:Normal  ECG: SB, rate 51. 1st degree AV block ECHO: - Left ventricle: The cavity size was normal. There was mild concentric hypertrophy. Systolic function was normal. Wall motion was normal; there were no regional wall motion abnormalities. Doppler parameters are consistent with abnormal left ventricular relaxation (grade 1 diastolic dysfunction). Doppler parameters   are consistent with indeterminate ventricular filling pressure. - Aortic valve: Transvalvular velocity was within the normal range. There was no stenosis. There was mild regurgitation. Regurgitation pressure half-time: 593 ms. - Mitral valve: Transvalvular velocity was within the normal range.   There was no evidence for stenosis. There was trivial   regurgitation. - Left atrium: The atrium was mildly dilated. - Right ventricle: The cavity size was normal. Wall thickness was normal. Systolic function was mildly reduced. - Tricuspid valve: There was mild regurgitation. - Pulmonary arteries: Systolic pressure was within the normal range. PA peak pressure: 29 mm Hg (S). Cardiologist is Dr. Mertie Moores Nuclear stress test 04/25/17: 1. EF 53%, no regional wall motion abnormalities noted.  2. Fixed small, mild basal inferolateral perfusion defect. Given normal wall motion, this may  represent attenuation. No evidence for significant ischemia.  Low risk study.    Neuro/Psych CVA, No Residual Symptoms negative psych ROS   GI/Hepatic Neg liver ROS, GERD  Medicated and Controlled,UC   Endo/Other  negative endocrine ROS  Renal/GU CRFRenal disease  negative genitourinary   Musculoskeletal negative musculoskeletal ROS (+)   Abdominal   Peds negative pediatric ROS (+)  Hematology  (+) anemia ,   Anesthesia Other Findings Hyperlipidemia  Reproductive/Obstetrics negative OB ROS                            Anesthesia Physical Anesthesia Plan  ASA: III  Anesthesia Plan: General   Post-op Pain Management:    Induction: Intravenous  Airway Management Planned: Oral ETT  Additional Equipment: Arterial line  Intra-op Plan:   Post-operative Plan: Extubation in OR  Informed Consent: I have reviewed the patients History and Physical, chart, labs and discussed the procedure including the risks, benefits and alternatives for the proposed anesthesia with the patient or authorized representative who has indicated his/her understanding and acceptance.   Dental advisory given  Plan Discussed with: CRNA  Anesthesia Plan Comments:         Anesthesia Quick Evaluation

## 2017-04-26 NOTE — Op Note (Signed)
    OPERATIVE REPORT  DATE OF SURGERY: 04/26/2017  PATIENT: Marc [name], 83 y.o. male MRN: [id number]  DOB: 1930-03-03  PRE-OPERATIVE DIAGNOSIS: Expansion of aneurysm sac with prior placed Gore excluder stent graft  POST-OPERATIVE DIAGNOSIS:  Same  PROCEDURE: Extension of right iliac stent graft  SURGEON:  Curt Jews, M.D.  PHYSICIAN ASSISTANT: Silva Bandy PA-C  ANESTHESIA:  Gen.  EBL: Minimal  Less than 20 cc IV contrast ml  Total I/O In: 800 [I.V.:800] Out: 290 [Urine:240; Blood:50]  BLOOD ADMINISTERED: None  DRAINS: None  SPECIMEN: None  COUNTS CORRECT:  YES  PLAN OF CARE: PACU   PATIENT DISPOSITION:  PACU - hemodynamically stable  PROCEDURE DETAILS: Patient was taken to the operative placed supine position where the area of the both groins prepped in usual sterile fashion. SonoSite ultrasound was used to localize the femoral artery and an 18-gauge needle was used to access the common femoral artery. The patient had a prior endarterectomy and patch of this area at the time of his initial surgery. Was passed centrally and this is confirmed with fluoroscopy. A 2 Perclose device technique was used for eventual closure of the arteriotomy. These were placed at 10:00 and 2:00 for eventual. Closing. French sheath was passed over the guidewire. Her pigtail catheter was positioned at the level renal arteries and 30 cranial projection was used. Injection at the level the renal arteries showed no evidence of endoleak. There was extensive plaque at the right renal artery and the aortic proximal portion of the endograft was at this area. There was a long fields on today's was seen by CT scan. Decision was made not to attempt any further intervention at the proximal attachment site. Next the 12 French sheath was exchanged for an 8 French sheath in the right groin. The 20 mm x 9.5 cm extension was brought onto the field and was positioned with the appropriate overlap inside the old  right limb of the graft. A retrograde hand injection through the sheath revealed the level of the internal iliac artery takeoff. The extension was deployed to land just above this. A Q 50 balloon was used to gently dilate at the proximal and distal attachment sites. Retrograde injection through the sheath revealed no evidence of endoleak. The sheath was removed and the Perclose devices were secured. The patient was transferred to the recovery room in stable condition  Rosetta Posner, M.D., [facility] 04/26/2017 11:43 AM

## 2017-04-26 NOTE — Transfer of Care (Signed)
Immediate Anesthesia Transfer of Care Note  Patient: Marc [name]  Procedure(s) Performed: Procedure(s): ABDOMINAL AORTIC ENDOVASCULAR STENT GRAFT- REVISE RIGHT AORTIC EXPANSION (Right)  Patient Location: PACU  Anesthesia Type:General  Level of Consciousness: awake, alert  and oriented  Airway & Oxygen Therapy: Patient Spontanous Breathing and Patient connected to nasal cannula oxygen  Post-op Assessment: Report given to RN and Post -op Vital signs reviewed and stable  Post vital signs: Reviewed and stable  Last Vitals:  Vitals:   04/26/17 0826  BP: (!) 167/58  Pulse: (!) 50  Resp: 16  Temp: 36.4 C    Last Pain: There were no vitals filed for this visit.       Complications: No apparent anesthesia complications

## 2017-04-26 NOTE — Anesthesia Procedure Notes (Signed)
Procedure Name: Intubation Date/Time: 04/26/2017 10:32 AM Performed by: Clearnce Sorrel Pre-anesthesia Checklist: Patient identified, Emergency Drugs available, Suction available, Patient being monitored and Timeout performed Patient Re-evaluated:Patient Re-evaluated prior to inductionOxygen Delivery Method: Circle system utilized Preoxygenation: Pre-oxygenation with 100% oxygen Intubation Type: IV induction Ventilation: Mask ventilation without difficulty Grade View: Grade III Tube type: Oral Tube size: 7.5 mm Number of attempts: 1 Airway Equipment and Method: Stylet Placement Confirmation: positive ETCO2 and breath sounds checked- equal and bilateral Secured at: 22 cm Tube secured with: Tape Dental Injury: Teeth and Oropharynx as per pre-operative assessment

## 2017-04-26 NOTE — Anesthesia Postprocedure Evaluation (Signed)
Anesthesia Post Note  Patient: Marc [name]  Procedure(s) Performed: Procedure(s) (LRB): ABDOMINAL AORTIC ENDOVASCULAR STENT GRAFT- REVISE RIGHT AORTIC EXPANSION (Right)     Patient location during evaluation: PACU Anesthesia Type: General Level of consciousness: awake and alert Pain management: pain level controlled Vital Signs Assessment: post-procedure vital signs reviewed and stable Respiratory status: spontaneous breathing, nonlabored ventilation, respiratory function stable and patient connected to nasal cannula oxygen Cardiovascular status: blood pressure returned to baseline and stable Postop Assessment: no signs of nausea or vomiting Anesthetic complications: no    Last Vitals:  Vitals:   04/26/17 1416 04/26/17 1430  BP: (!) 161/63   Pulse: (!) 51 (!) 51  Resp: 15 12  Temp:      Last Pain:  Vitals:   04/26/17 1330  PainSc: 0-No pain                 Ryan P Ellender

## 2017-04-26 NOTE — H&P (View-Only) (Signed)
Progress Notes Encounter Date: 04/02/2017 Rosetta Posner, MD  Vascular Surgery    [] Hide copied text [] Hover for attribution information                                       [facility]  Patient name: Marc [name]         MRN: [id number]        DOB: Jul 24, 1930          Sex: male  REASON FOR VISIT: Follow-up abdominal aortic aneurysm  HPI: Marc [name] is a 83 y.o. male 5 years status post stent graft repair of abdominal aortic aneurysm. On recent duplex ultrasound follow-up was found to have some enlargement and his aneurysm sac size. He was scheduled for CT scan for further evaluation of this. He does have known arterial insufficiency. Creatinine was 2.1 and he underwent a noncontrast study. He has a general failing health. Is here today with his daughter. Has generalized weakness. Unsteady gait with occasional falling. Does have a diffuse back pain which is unchanged.      Past Medical History:  Diagnosis Date  . AAA (abdominal aortic aneurysm) ([place])   . Arthritis   . Coronary artery disease    s/p CABG Dr. Servando Snare, Dr. Acie Fredrickson  . Gall stones   . GERD (gastroesophageal reflux disease)   . History of Clostridium difficile infection   . History of ulcerative colitis   . Hyperlipidemia   . Hypertension   . Kidney stone on left side    has not passed as of 09/23/12  . Stroke [facility]) 2012   affecting RUE          Family History  Problem Relation Age of Onset  . Stroke Mother   . Hypertension Mother   . Heart disease Sister     Aneurysm of the Brain    SOCIAL HISTORY:      Social History  Substance Use Topics  . Smoking status: Former Smoker    Packs/day: 1.00    Years: 68.00    Types: Cigarettes    Quit date: 08/04/2012  . Smokeless tobacco: Never Used  . Alcohol use No    No Known Allergies        Current Outpatient Prescriptions  Medication Sig Dispense Refill  . acetaminophen (TYLENOL)  500 MG tablet Take 500 mg by mouth every 8 (eight) hours as needed.    Marland Kitchen allopurinol (ZYLOPRIM) 100 MG tablet TAKE 2 TABLETS BY MOUTH DAILY AFTER MEALS FOR GOUT  3  . amoxicillin (AMOXIL) 500 MG capsule Reported on 01/04/2016    . aspirin EC 81 MG tablet Take 81 mg by mouth daily.    Marland Kitchen atorvastatin (LIPITOR) 20 MG tablet TAKE 1 TABLET BY MOUTH DAILY AT 6PM (Patient not taking: Reported on 04/02/2017) 30 tablet 1  . Calcium Carbonate Antacid (TUMS PO) Take by mouth daily.    Marland Kitchen escitalopram (LEXAPRO) 20 MG tablet Take 20 mg by mouth daily.    Marland Kitchen gabapentin (NEURONTIN) 300 MG capsule 100 mg.     . metoprolol (LOPRESSOR) 100 MG tablet TAKE 2 TABLETS BY MOUTH EVERY DAY FOR HYPERTENSION  7  . Multiple Vitamin (MULTIVITAMIN WITH MINERALS) TABS Take 1 tablet by mouth daily.    . pseudoephedrine-acetaminophen (TYLENOL SINUS) 30-500 MG TABS Take 1 tablet by mouth every 4 (four) hours as needed.    Marland Kitchen  ranitidine (ZANTAC) 150 MG capsule Take 150 mg by mouth 2 (two) times daily as needed. For acid reflux    . SODIUM BICARBONATE PO Take by mouth 2 (two) times daily.    . tamsulosin (FLOMAX) 0.4 MG CAPS capsule Take 0.4 mg by mouth at bedtime.  7   No current facility-administered medications for this visit.     REVIEW OF SYSTEMS:  [X]  denotes positive finding, [ ]  denotes negative finding Cardiac  Comments:  Chest pain or chest pressure:    Shortness of breath upon exertion: x   Short of breath when lying flat:    Irregular heart rhythm:        Vascular    Pain in calf, thigh, or hip brought on by ambulation:    Pain in feet at night that wakes you up from your sleep:     Blood clot in your veins:    Leg swelling:           PHYSICAL EXAM:     Vitals:   04/02/17 1509 04/02/17 1513  BP: (!) 152/95 (!) 147/98  Pulse: (!) 111   Resp: 18   Temp: 97.1 F (36.2 C)   TempSrc: Oral   SpO2: 93%   Weight: 153 lb (69.4 kg)   Height: 5\' 7"   (1.702 m)     GENERAL: The patient is a well-nourished male, in no acute distress. The vital signs are documented above.Frail appearing CARDIOVASCULAR: Palpable radial and femoral pulses. Abdomen soft with no palpable aneurysm PULMONARY: There is good air exchange  MUSCULOSKELETAL: There are no major deformities or cyanosis. NEUROLOGIC: No focal weakness or paresthesias are detected. SKIN: There are no ulcers or rashes noted. PSYCHIATRIC: The patient has a normal affect.  DATA:  CT scan from today was discussed with the patient and his daughter. He does have enlargement of his aneurysm size suggests over 7 cm with comparison to his December 2015 study of 6.1 cm. This is a noncontrast study shows difficult determine the possible presence of an endoleak. Does not appear to have any disruption of his components and does appear to have ceiling of his proximal and distal attachment sites.  After the patient left our office I was called by Dr.Henn who is a radiologist interpreting the study. Was concern regarding an area of the potential surgeon at the approximate 7:00 position on the right medial portion of his aneurysm sac. This was not present in 2015 and may represent contained a blood in this area.  MEDICAL ISSUES: Very difficult management situation with a very frail 83 year old gentleman. Also with his renal insufficiency makes further evaluation of this difficult. I will contact patient tomorrow and schedule probable admission for hydration and a contrast CT scan for further evaluation. Inserting with aneurysm sac enlargement and also the area of concern on the right medial portion of his aortic aneurysm sac.    Rosetta Posner, MD FACS [facility] [phone] Pager [phone]     Electronically signed by Rosetta Posner, MD at 04/02/2017 7:53 PM      Office Visit on 04/02/2017        Routing History        Detailed Report     I have examined the patient, reviewed and agree with above.Reports that he slept very little last night. Had overnight hydration. Creatinine was 2 on admission and 2 Marc [name] this morning. Will obtain a CT angiogram for evaluation of his  stent graft with that recommendations to follow.  Curt Jews, MD 04/10/2017  8:57 AM

## 2017-04-26 NOTE — Interval H&P Note (Signed)
History and Physical Interval Note:  04/26/2017 9:41 AM  Marc [name]  has presented today for surgery, with the diagnosis of Endoleak T82.539 AAA; Aortic Expansion  I71.4  The various methods of treatment have been discussed with the patient and family. After consideration of risks, benefits and other options for treatment, the patient has consented to  Procedure(s): ABDOMINAL AORTIC ENDOVASCULAR STENT GRAFT- REVISE RIGHT AORTIC EXPANSION (N/A) as a surgical intervention .  The patient's history has been reviewed, patient examined, no change in status, stable for surgery.  I have reviewed the patient's chart and labs.  Questions were answered to the patient's satisfaction.     Curt Jews

## 2017-04-27 LAB — BASIC METABOLIC PANEL
ANION GAP: 8 (ref 5–15)
BUN: 25 mg/dL — ABNORMAL HIGH (ref 6–20)
CALCIUM: 8.7 mg/dL — AB (ref 8.9–10.3)
CO2: 24 mmol/L (ref 22–32)
Chloride: 102 mmol/L (ref 101–111)
Creatinine, Ser: 1.96 mg/dL — ABNORMAL HIGH (ref 0.61–1.24)
GFR calc non Af Amer: 29 mL/min — ABNORMAL LOW (ref >60)
GFR, EST AFRICAN AMERICAN: 34 mL/min — AB (ref >60)
GLUCOSE: 98 mg/dL (ref 65–99)
POTASSIUM: 3.9 mmol/L (ref 3.5–5.1)
SODIUM: 134 mmol/L — AB (ref 135–145)

## 2017-04-27 LAB — CBC
HCT: 36.4 % — ABNORMAL LOW (ref 39.0–52.0)
Hemoglobin: 11.9 g/dL — ABNORMAL LOW (ref 13.0–17.0)
MCH: 29.8 pg (ref 26.0–34.0)
MCHC: 32.7 g/dL (ref 30.0–36.0)
MCV: 91 fL (ref 78.0–100.0)
PLATELETS: 151 10*3/uL (ref 150–400)
RBC: 4 MIL/uL — AB (ref 4.22–5.81)
RDW: 13.7 % (ref 11.5–15.5)
WBC: 10.5 10*3/uL (ref 4.0–10.5)

## 2017-04-27 LAB — URINE CULTURE: CULTURE: NO GROWTH

## 2017-04-27 MED ORDER — SULFAMETHOXAZOLE-TRIMETHOPRIM 800-160 MG PO TABS: 1.0000 | ORAL_TABLET | Freq: Two times a day (BID) | ORAL | 0 refills | Status: DC

## 2017-04-27 MED ORDER — OXYCODONE-ACETAMINOPHEN 5-325 MG PO TABS: 1.0000 | ORAL_TABLET | ORAL | 0 refills | Status: DC | PRN

## 2017-04-27 MED ORDER — FAMOTIDINE 20 MG PO TABS: 20.0000 mg | ORAL_TABLET | Freq: Every day | ORAL | Status: DC

## 2017-04-27 NOTE — Progress Notes (Addendum)
  [facility]ists Progress Note  Subjective  - POD #1  No complaints this am.   Objective Vitals:   04/27/17 0600 04/27/17 0718  BP: 136/60 (!) 146/62  Pulse: (!) 56 (!) 55  Resp: 20 15  Temp:  98.9 F (37.2 C)    Intake/Output Summary (Last 24 hours) at 04/27/17 0820 Last data filed at 04/27/17 0545  Gross per 24 hour  Intake             1254 ml  Output             2665 ml  Net            -1411 ml   Right groin without hematoma. Right DP and PT doppler signals  Assessment/Planning: 83 y.o. male is s/p: extension of right iliac stent graft  1 Day Post-Op   CKD: Creatinine at baseline this am.  Hematuria: probable trauma from foley insertion. UA with nitrites and trace leukocytes. Culture pending. Given prosthetic graft will treat for UTI.  Ambulated yesterday. Ambulate again this am.  D/c home today if able to void.   Alvia Grove 04/27/2017 8:20 AM -- Agree with above.  Groin without hematoma. [place] for d/c  Ruta Hinds, MD [facility] [place] Office: [phone] Pager: [phone]   Laboratory CBC    Component Value Date/Time   WBC 10.5 04/27/2017 0531   HGB 11.9 (L) 04/27/2017 0531   HCT 36.4 (L) 04/27/2017 0531   PLT 151 04/27/2017 0531    BMET    Component Value Date/Time   NA 134 (L) 04/27/2017 0531   K 3.9 04/27/2017 0531   CL 102 04/27/2017 0531   CO2 24 04/27/2017 0531   GLUCOSE 98 04/27/2017 0531   BUN 25 (H) 04/27/2017 0531   CREATININE 1.96 (H) 04/27/2017 0531   CREATININE 1.46 (H) 07/03/2012 1436   CALCIUM 8.7 (L) 04/27/2017 0531   GFRNONAA 29 (L) 04/27/2017 0531   GFRAA 34 (L) 04/27/2017 0531    COAG Lab Results  Component Value Date   INR 1.18 04/26/2017   INR 1.07 04/19/2017   INR 1.09 04/09/2017   No results found for: PTT  Antibiotics Anti-infectives    Start     Dose/Rate Route Frequency Ordered Stop   04/26/17 2230  cefUROXime (ZINACEF) 1.5 g in dextrose 5 % 50 mL IVPB       1.5 g 100 mL/hr over 30 Minutes Intravenous Every 12 hours 04/26/17 1953 04/27/17 2229   04/26/17 0822  cefUROXime (ZINACEF) 1.5 g in dextrose 5 % 50 mL IVPB     1.5 g 100 mL/hr over 30 Minutes Intravenous 30 min pre-op 04/26/17 2956 04/26/17 Strong, PA-C [facility] Office: [phone] Pager: [phone] 04/27/2017 8:20 AM

## 2017-04-28 ENCOUNTER — Telehealth: Payer: Self-pay | Admitting: Vascular Surgery

## 2017-04-28 NOTE — Telephone Encounter (Signed)
Called patient and spoke with daughter [name]. Patient was started on antibiotics for possible UTI given prosthetic graft. Urine culture came back negative and patient advised to discontinue antibiotics.

## 2017-04-29 ENCOUNTER — Encounter (HOSPITAL_COMMUNITY): Payer: Self-pay | Admitting: Vascular Surgery

## 2017-04-29 NOTE — Discharge Summary (Signed)
[facility] Discharge Summary  Marc [name] Sep 08, 1930 83 y.o. male  [id number]  Admission Date: 04/26/2017  Discharge Date: 04/27/2017  Physician: Curt Jews, MD  Admission Diagnosis: Endoleak T82.539 AAA; Aortic Expansion  I71.4  HPI:   This is a 83 y.o. male who is 5 years status post stent graft repair of abdominal aortic aneurysm. On recent duplex ultrasound follow-up was found to have some enlargement and his aneurysm sac size. He was scheduled for CT scan for further evaluation of this. He does have known arterial insufficiency. Creatinine was 2.1 and he underwent a noncontrast study. He has a general failing health. Is here today with his daughter. Has generalized weakness. Unsteady gait with occasional falling. Does have a diffuse back pain which is unchanged.  Hospital Course:  The patient was admitted to the hospital and taken to the operating room on 04/26/2017 and underwent: Extension of right iliac stent graft for expansion of aneurysm sac    The patient tolerated the procedure well and was transported to the PACU in stable condition.   The patient's creatinine was at baseline on POD 1. His left groin was soft without hematoma. He had right DP and PT doppler signals. His UA had nitrites and trace leukocytes. Given prosthetic graft he was treated for UTI. He was discharged home on POD 1 in good condition.    CBC    Component Value Date/Time   WBC 10.5 04/27/2017 0531   RBC 4.00 (L) 04/27/2017 0531   HGB 11.9 (L) 04/27/2017 0531   HCT 36.4 (L) 04/27/2017 0531   PLT 151 04/27/2017 0531   MCV 91.0 04/27/2017 0531   MCH 29.8 04/27/2017 0531   MCHC 32.7 04/27/2017 0531   RDW 13.7 04/27/2017 0531   LYMPHSABS 1.0 07/23/2012 1021   MONOABS 0.6 07/23/2012 1021   EOSABS 0.2 07/23/2012 1021   BASOSABS 0.0 07/23/2012 1021    BMET    Component Value Date/Time   NA 134 (L) 04/27/2017 0531   K 3.9 04/27/2017 0531   CL 102 04/27/2017 0531   CO2  24 04/27/2017 0531   GLUCOSE 98 04/27/2017 0531   BUN 25 (H) 04/27/2017 0531   CREATININE 1.96 (H) 04/27/2017 0531   CREATININE 1.46 (H) 07/03/2012 1436   CALCIUM 8.7 (L) 04/27/2017 0531   GFRNONAA 29 (L) 04/27/2017 0531   GFRAA 34 (L) 04/27/2017 0531     Discharge Instructions:   The patient is discharged to home with extensive instructions on wound care and progressive ambulation.  They are instructed not to drive or perform any heavy lifting until returning to see the physician in his office.  Discharge Instructions    Call MD for:  redness, tenderness, or signs of infection (pain, swelling, bleeding, redness, odor or green/yellow discharge around incision site)    Complete by:  As directed    Call MD for:  severe or increased pain, loss or decreased feeling  in affected limb(s)    Complete by:  As directed    Call MD for:  temperature >100.5    Complete by:  As directed    Discharge wound care:    Complete by:  As directed    Take off dressing from groin when you get home from the hospital. Shower daily and wash groin with soap and water and pat dry. You do not have to reapply a dressing.   Driving Restrictions    Complete by:  As directed    No driving for  1 week and while on pain medication   Increase activity slowly    Complete by:  As directed    Walk with assistance use walker or cane as needed   Lifting restrictions    Complete by:  As directed    No heavy lifting for 4 weeks   Resume previous diet    Complete by:  As directed       Discharge Diagnosis:  Endoleak T82.539 AAA; Aortic Expansion  I71.4  Secondary Diagnosis: Patient Active Problem List   Diagnosis Date Noted  . Abdominal aortic aneurysm (AAA) ([place]) 04/26/2017  . Atherosclerosis of native arteries of the extremities with intermittent claudication 07/06/2014  . Pain in limb 07/06/2014  . Weakness of both legs 07/06/2014  . Numbness in both legs 07/06/2014  . Acute on chronic renal insufficiency  02/20/2013  . Essential hypertension 12/01/2012  . AAA (abdominal aortic aneurysm) ([place]) 10/28/2012  . Coronary artery disease 08/04/2012    Class: Diagnosis of  . History of stroke without residual deficits 08/04/2012    Class: Diagnosis of  . Tobacco dependence 08/04/2012  . Abdominal aneurysm without mention of rupture 01/08/2012  . CLOSTRIDIUM DIFFICILE COLITIS 08/30/2009  . DIVERTICULOSIS-COLON 08/30/2009  . WEIGHT LOSS-ABNORMAL 08/30/2009  . PERSONAL HX COLONIC POLYPS 08/30/2009   Past Medical History:  Diagnosis Date  . AAA (abdominal aortic aneurysm) ([place])   . AAA (abdominal aortic aneurysm) ([place])   . Arthritis   . CKD (chronic kidney disease)    CKD III-IV (Dr. Posey Pronto, [facility])  . Coronary artery disease    s/p CABG Dr. Servando Snare, Dr. Acie Fredrickson  . Falls frequently   . Gall stones    patiet unsure  . GERD (gastroesophageal reflux disease)   . Gout   . History of Clostridium difficile infection   . History of kidney stones   . History of ulcerative colitis   . Hyperlipidemia   . Hypertension   . Kidney stone on left side    has not passed as of 09/23/12  . Stroke [facility]) 2012   affecting RUE     Allergies as of 04/27/2017      Reactions   No Known Allergies       Medication List    TAKE these medications   acetaminophen 500 MG tablet Commonly known as:  TYLENOL Take 1,000 mg by mouth every 8 (eight) hours as needed for mild pain or headache.   allopurinol 100 MG tablet Commonly known as:  ZYLOPRIM TAKE 200 mg BY MOUTH DAILY AFTER MEALS FOR GOUT   aspirin EC 81 MG tablet Take 81 mg by mouth daily.   B-12 PO Take 1 tablet by mouth daily.   cholecalciferol 1000 units tablet Commonly known as:  VITAMIN D Take 1,000 Units by mouth daily.   escitalopram 20 MG tablet Commonly known as:  LEXAPRO Take 20 mg by mouth at bedtime.   gabapentin 100 MG capsule Commonly known as:  NEURONTIN Take 100 mg by mouth at bedtime.   guaiFENesin 600 MG 12 hr  tablet Commonly known as:  MUCINEX Take 600 mg by mouth daily as needed for cough or to loosen phlegm.   metoprolol tartrate 100 MG tablet Commonly known as:  LOPRESSOR TAKE 200 MG BY MOUTH EVERY DAY FOR HYPERTENSION   multivitamin with minerals Tabs tablet Take 1 tablet by mouth daily.   oxyCODONE-acetaminophen 5-325 MG tablet Commonly known as:  PERCOCET/ROXICET Take 1-2 tablets by mouth every 4 (four) hours as needed for  moderate pain.   ranitidine 150 MG capsule Commonly known as:  ZANTAC Take 150 mg by mouth at bedtime.   sodium bicarbonate 650 MG tablet Take 650 mg by mouth 2 (two) times daily.   sulfamethoxazole-trimethoprim 800-160 MG tablet Commonly known as:  BACTRIM DS,SEPTRA DS Take 1 tablet by mouth 2 (two) times daily.   tamsulosin 0.4 MG Caps capsule Commonly known as:  FLOMAX Take 0.4 mg by mouth at bedtime.       Percocet #8 No Refill  Disposition: home  Patient's condition: is Good  Follow up: 1. Dr. Donnetta Hutching in 6 months with non contrast CT   Virgina Jock, [place] [facility] [phone] 04/29/2017  2:32 PM   - For VQI Registry use --- Instructions: Press F2 to tab through selections.  Delete question if not applicable.   Post-op:  Time to Extubation: [ x] In OR, [ ]  < 12 hrs, [ ]  12-24 hrs, [ ]  >=24 hrs Vasopressors Req. Post-op: No MI: No., [ ]  Troponin only, [ ]  EKG or Clinical New Arrhythmia: No CHF: No ICU Stay: 0 days Transfusion: No    Complications: Resp failure: No., [ ]  Pneumonia, [ ]  Ventilator Chg in renal function: No., [ ]  Inc. Cr > 0.5, [ ]  Temp. Dialysis, [ ]  Permanent dialysis Leg ischemia: No., no Surgery needed, [ ]  Yes, Surgery needed, [ ]  Amputation Bowel ischemia: No., [ ]  Medical Rx, [ ]  Surgical Rx Wound complication: No., [ ]  Superficial separation/infection, [ ]  Return to OR Return to OR: No  Return to OR for bleeding: No Stroke: No., [ ]  Minor, [ ]  Major  Discharge medications: Statin  use:  No If No: [ ]  For Medical reasons, [ ]  Non-compliant, [x ] Not-indicated ASA use:  Yes  If No: [ ]  For Medical reasons, [ ]  Non-compliant, [ ]  Not-indicated Plavix use:  No If No: [ ]  For Medical reasons, [ ]  Non-compliant, [x ] Not-indicated Beta blocker use:  Yes If No: [ ]  For Medical reasons, [ ]  Non-compliant, [ ]  Not-indicated

## 2017-04-29 NOTE — Addendum Note (Signed)
Addended by: Lianne Cure A on: 04/29/2017 10:13 AM   Modules accepted: Orders

## 2017-05-04 ENCOUNTER — Inpatient Hospital Stay (HOSPITAL_COMMUNITY)
Admission: EM | Admit: 2017-05-04 | Discharge: 2017-05-06 | DRG: 065 | Disposition: A | Discharge status: Home / Self Care | Payer: Medicare Other | Attending: Student in an Organized Health Care Education/Training Program | Admitting: Student in an Organized Health Care Education/Training Program

## 2017-05-04 ENCOUNTER — Other Ambulatory Visit: Payer: Self-pay

## 2017-05-04 ENCOUNTER — Encounter (HOSPITAL_COMMUNITY): Payer: Self-pay

## 2017-05-04 ENCOUNTER — Observation Stay (HOSPITAL_COMMUNITY): Payer: Medicare Other

## 2017-05-04 ENCOUNTER — Emergency Department (HOSPITAL_COMMUNITY): Payer: Medicare Other

## 2017-05-04 DIAGNOSIS — I129 Hypertensive chronic kidney disease with stage 1 through stage 4 chronic kidney disease, or unspecified chronic kidney disease: Secondary | ICD-10-CM | POA: Diagnosis present

## 2017-05-04 DIAGNOSIS — M109 Gout, unspecified: Secondary | ICD-10-CM | POA: Diagnosis present

## 2017-05-04 DIAGNOSIS — N184 Chronic kidney disease, stage 4 (severe): Secondary | ICD-10-CM | POA: Diagnosis present

## 2017-05-04 DIAGNOSIS — R270 Ataxia, unspecified: Secondary | ICD-10-CM

## 2017-05-04 DIAGNOSIS — H532 Diplopia: Secondary | ICD-10-CM

## 2017-05-04 DIAGNOSIS — Z79899 Other long term (current) drug therapy: Secondary | ICD-10-CM

## 2017-05-04 DIAGNOSIS — K219 Gastro-esophageal reflux disease without esophagitis: Secondary | ICD-10-CM | POA: Diagnosis present

## 2017-05-04 DIAGNOSIS — I639 Cerebral infarction, unspecified: Secondary | ICD-10-CM | POA: Diagnosis not present

## 2017-05-04 DIAGNOSIS — R29702 NIHSS score 2: Secondary | ICD-10-CM | POA: Diagnosis present

## 2017-05-04 DIAGNOSIS — H5123 Internuclear ophthalmoplegia, bilateral: Secondary | ICD-10-CM | POA: Diagnosis present

## 2017-05-04 DIAGNOSIS — I2510 Atherosclerotic heart disease of native coronary artery without angina pectoris: Secondary | ICD-10-CM | POA: Diagnosis present

## 2017-05-04 DIAGNOSIS — Z8673 Personal history of transient ischemic attack (TIA), and cerebral infarction without residual deficits: Secondary | ICD-10-CM

## 2017-05-04 DIAGNOSIS — Z951 Presence of aortocoronary bypass graft: Secondary | ICD-10-CM

## 2017-05-04 LAB — URINALYSIS, ROUTINE W REFLEX MICROSCOPIC
Bilirubin Urine: NEGATIVE
Glucose, UA: NEGATIVE mg/dL
HGB URINE DIPSTICK: NEGATIVE
Ketones, ur: NEGATIVE mg/dL
LEUKOCYTES UA: NEGATIVE
Nitrite: NEGATIVE
PROTEIN: NEGATIVE mg/dL
SPECIFIC GRAVITY, URINE: 1.014 (ref 1.005–1.030)
pH: 7 (ref 5.0–8.0)

## 2017-05-04 LAB — COMPREHENSIVE METABOLIC PANEL
ALBUMIN: 3.4 g/dL — AB (ref 3.5–5.0)
ALK PHOS: 80 U/L (ref 38–126)
ALT: 13 U/L — AB (ref 17–63)
AST: 19 U/L (ref 15–41)
Anion gap: 8 (ref 5–15)
BUN: 26 mg/dL — ABNORMAL HIGH (ref 6–20)
CALCIUM: 8.4 mg/dL — AB (ref 8.9–10.3)
CHLORIDE: 102 mmol/L (ref 101–111)
CO2: 25 mmol/L (ref 22–32)
Creatinine, Ser: 1.88 mg/dL — ABNORMAL HIGH (ref 0.61–1.24)
GFR calc non Af Amer: 31 mL/min — ABNORMAL LOW (ref >60)
GFR, EST AFRICAN AMERICAN: 36 mL/min — AB (ref >60)
GLUCOSE: 105 mg/dL — AB (ref 65–99)
Potassium: 3.9 mmol/L (ref 3.5–5.1)
SODIUM: 135 mmol/L (ref 135–145)
Total Bilirubin: 0.4 mg/dL (ref 0.3–1.2)
Total Protein: 6.4 g/dL — ABNORMAL LOW (ref 6.5–8.1)

## 2017-05-04 LAB — DIFFERENTIAL
BASOS PCT: 1 %
Basophils Absolute: 0 10*3/uL (ref 0.0–0.1)
Eosinophils Absolute: 0.2 10*3/uL (ref 0.0–0.7)
Eosinophils Relative: 3 %
Lymphocytes Relative: 20 %
Lymphs Abs: 1.4 10*3/uL (ref 0.7–4.0)
Monocytes Absolute: 0.5 10*3/uL (ref 0.1–1.0)
Monocytes Relative: 8 %
NEUTROS PCT: 68 %
Neutro Abs: 4.8 10*3/uL (ref 1.7–7.7)

## 2017-05-04 LAB — APTT: aPTT: 38 seconds — ABNORMAL HIGH (ref 24–36)

## 2017-05-04 LAB — I-STAT CHEM 8, ED
BUN: 29 mg/dL — AB (ref 6–20)
CHLORIDE: 103 mmol/L (ref 101–111)
Calcium, Ion: 1.13 mmol/L — ABNORMAL LOW (ref 1.15–1.40)
Creatinine, Ser: 1.9 mg/dL — ABNORMAL HIGH (ref 0.61–1.24)
Glucose, Bld: 100 mg/dL — ABNORMAL HIGH (ref 65–99)
HEMATOCRIT: 37 % — AB (ref 39.0–52.0)
Hemoglobin: 12.6 g/dL — ABNORMAL LOW (ref 13.0–17.0)
POTASSIUM: 4 mmol/L (ref 3.5–5.1)
SODIUM: 140 mmol/L (ref 135–145)
TCO2: 27 mmol/L (ref 0–100)

## 2017-05-04 LAB — ETHANOL

## 2017-05-04 LAB — CBC
HCT: 38.3 % — ABNORMAL LOW (ref 39.0–52.0)
Hemoglobin: 12.6 g/dL — ABNORMAL LOW (ref 13.0–17.0)
MCH: 30.1 pg (ref 26.0–34.0)
MCHC: 32.9 g/dL (ref 30.0–36.0)
MCV: 91.4 fL (ref 78.0–100.0)
PLATELETS: 151 10*3/uL (ref 150–400)
RBC: 4.19 MIL/uL — AB (ref 4.22–5.81)
RDW: 13.9 % (ref 11.5–15.5)
WBC: 7 10*3/uL (ref 4.0–10.5)

## 2017-05-04 LAB — PROTIME-INR
INR: 1.07
Prothrombin Time: 13.9 seconds (ref 11.4–15.2)

## 2017-05-04 LAB — I-STAT TROPONIN, ED: Troponin i, poc: 0 ng/mL (ref 0.00–0.08)

## 2017-05-04 NOTE — H&P (Signed)
Date: 05/04/2017               Patient Name:  Marc [name] MRN: [id number]  DOB: 1930/11/04 Age / Sex: 83 y.o., male   PCP: Leonard Downing, MD         Medical Service: Internal Medicine Teaching Service         Attending Physician: Dr. Aldine Contes, MD    First Contact: Dr. Lovena Le Pager: [phone]  Second Contact: Dr. Posey Pronto Pager: [phone]       After Hours (After 5p/  First Contact Pager: [phone]  weekends / holidays): Second Contact Pager: [phone]   Chief Complaint: Double vision and gait instability for one day.  History of Present Illness:Marc [name] is a 83 y.o.man eith PMHx Significant for HTN, CKD,CAD status post CABG, AAA status post graft placement on 04/26/17,prior strokes who presents to the ED with new onset diplopia and ataxia that began yesterday evening.   Patient was in his usual state of health and recovering from his recent surgery very well till around 8 PM on 05/02/2017, and he notices double vision. He went to bed, double vision persisted next morning, his daughter noticed some abnormal eye movements and gait instability while walking, called the EMS. Patient denied any headache, focal weakness or numbness. He does endorse mild dizziness, denies any dyspnea, chest pain or palpitations. He also has an history of intermittent diarrhea for 6 month. Denies any recent change in his appetite. Denies any urinary symptoms.  In the ED. He was found to have internuclear ophthalmoplegia and elevated blood pressure. An MRI was positive for a small brainstem infarct. He was admitted for stroke workup.   Meds:  Current Meds  Medication Sig  . acetaminophen (TYLENOL) 500 MG tablet Take 1,000 mg by mouth every 8 (eight) hours as needed for mild pain or headache.   . allopurinol (ZYLOPRIM) 100 MG tablet TAKE 200 mg BY MOUTH DAILY AFTER MEALS FOR GOUT  . aspirin EC 81 MG tablet Take 81 mg by mouth daily.  . cholecalciferol (VITAMIN D) 1000 units tablet  Take 1,000 Units by mouth daily.  . Cyanocobalamin (B-12 PO) Take 1 tablet by mouth daily.  Marland Kitchen escitalopram (LEXAPRO) 20 MG tablet Take 20 mg by mouth at bedtime.   . gabapentin (NEURONTIN) 100 MG capsule Take 100 mg by mouth at bedtime.   Marland Kitchen guaiFENesin (MUCINEX) 600 MG 12 hr tablet Take 600 mg by mouth daily as needed for cough or to loosen phlegm.   . metoprolol (LOPRESSOR) 100 MG tablet TAKE 200 MG BY MOUTH EVERY DAY FOR HYPERTENSION  . Multiple Vitamin (MULTIVITAMIN WITH MINERALS) TABS Take 1 tablet by mouth daily.  Marland Kitchen oxyCODONE-acetaminophen (PERCOCET/ROXICET) 5-325 MG tablet Take 1-2 tablets by mouth every 4 (four) hours as needed for moderate pain.  . ranitidine (ZANTAC) 150 MG capsule Take 150 mg by mouth at bedtime.   . sodium bicarbonate 650 MG tablet Take 650 mg by mouth 2 (two) times daily.   . tamsulosin (FLOMAX) 0.4 MG CAPS capsule Take 0.4 mg by mouth at bedtime.     Allergies: Allergies as of 05/04/2017 - Review Complete 05/04/2017  Allergen Reaction Noted  . No known allergies  04/25/2017   Past Medical History:  Diagnosis Date  . AAA (abdominal aortic aneurysm) ([place])   . AAA (abdominal aortic aneurysm) ([place])   . Arthritis   . CKD (chronic kidney disease)    CKD III-IV (Dr. Posey Pronto, [facility])  .  Coronary artery disease    s/p CABG Dr. Servando Snare, Dr. Acie Fredrickson  . Falls frequently   . Gall stones    patiet unsure  . GERD (gastroesophageal reflux disease)   . Gout   . History of Clostridium difficile infection   . History of kidney stones   . History of ulcerative colitis   . Hyperlipidemia   . Hypertension   . Kidney stone on left side    has not passed as of 09/23/12  . Stroke [facility]) 2012   affecting RUE    Family History: Noncontributory.  Social History: Lives with his daughter, still smokes 4-5 cigarettes per day, used to smoke 1-2 pack per day. Denies any alcohol or illicit drug use.  Review of Systems: A complete ROS was negative except as per HPI.    Physical Exam: Blood pressure (!) 182/76, pulse (!) 104, temperature 98.5 F (36.9 C), resp. rate (!) 8, height 5\' 7"  (1.702 m), weight 158 lb (71.7 kg), SpO2 95 %. Vitals:   05/04/17 2030 05/04/17 2111 05/04/17 2200 05/04/17 2306  BP: (!) 183/79  (!) 196/85 (!) 182/76  Pulse:   (!) 50 (!) 104  Resp: 19  19 (!) 8  Temp:  98.5 F (36.9 C)    TempSrc:      SpO2:   96% 95%  Weight:      Height:       General: Vital signs reviewed.  Patient is well-developed elderly man, in no acute distress and cooperative with exam.  Head: Normocephalic and atraumatic. Eyes: Central diplopia, both eyes were unable to abduct/cross midline. conjunctivae normal, no scleral icterus.  pupil round and reactive to light bilaterally. Neck: Supple, trachea midline, normal ROM, no JVD, masses, thyromegaly, or carotid bruit present.  Cardiovascular: RRR, S1 normal, S2 normal, no murmurs, gallops, or rubs. Pulmonary/Chest: Clear to auscultation bilaterally, no wheezes, rales, or rhonchi. Abdominal: Soft, non-tender, non-distended, BS +, no masses, organomegaly, or guarding present.  Extremities: No lower extremity edema bilaterally,  pulses symmetric and intact bilaterally. No cyanosis or clubbing. Neurological: A&O x3, Strength is normal and symmetric bilaterally, cranial nerve II-XII are grossly intact, unable to adduct/ past midline in both eyes, central diplopia. no focal motor deficit, sensory intact to light touch bilaterally.  Skin: Warm, dry and intact. No rashes or erythema. Psychiatric: Normal mood and affect. speech and behavior is normal.   Labs. CBC    Component Value Date/Time   WBC 7.0 05/04/2017 1957   RBC 4.19 (L) 05/04/2017 1957   HGB 12.6 (L) 05/04/2017 2010   HCT 37.0 (L) 05/04/2017 2010   PLT 151 05/04/2017 1957   MCV 91.4 05/04/2017 1957   MCH 30.1 05/04/2017 1957   MCHC 32.9 05/04/2017 1957   RDW 13.9 05/04/2017 1957   LYMPHSABS 1.4 05/04/2017 1957   MONOABS 0.5 05/04/2017 1957     EOSABS 0.2 05/04/2017 1957   BASOSABS 0.0 05/04/2017 1957   CMP Latest Ref Rng & Units 05/04/2017 05/04/2017 04/27/2017  Glucose 65 - 99 mg/dL 100(H) 105(H) 98  BUN 6 - 20 mg/dL 29(H) 26(H) 25(H)  Creatinine 0.61 - 1.24 mg/dL 1.90(H) 1.88(H) 1.96(H)  Sodium 135 - 145 mmol/L 140 135 134(L)  Potassium 3.5 - 5.1 mmol/L 4.0 3.9 3.9  Chloride 101 - 111 mmol/L 103 102 102  CO2 22 - 32 mmol/L - 25 24  Calcium 8.9 - 10.3 mg/dL - 8.4(L) 8.7(L)  Total Protein 6.5 - 8.1 g/dL - 6.4(L) -  Total Bilirubin 0.3 - 1.2  mg/dL - 0.4 -  Alkaline Phos 38 - 126 U/L - 80 -  AST 15 - 41 U/L - 19 -  ALT 17 - 63 U/L - 13(L) -   Troponin.0.00 Ethanol. <5  EKG:   CT head WO contrast. FINDINGS: Brain: Moderate cerebral and mild cerebellar atrophy. Patchy and confluent areas of decreased attenuation are noted throughout the deep and periventricular white matter of the cerebral hemispheres bilaterally, compatible with chronic microvascular ischemic disease. No evidence of acute infarction, hemorrhage, hydrocephalus, extra-axial collection or mass lesion/mass effect.  Vascular: No hyperdense vessel or unexpected calcification.  Skull: Normal. Negative for fracture or focal lesion.  Sinuses/Orbits: No acute finding.  Other: None.  IMPRESSION: 1. No acute intracranial abnormalities. 2. Moderate cerebral and mild cerebellar atrophy with extensive chronic microvascular ischemic changes in the cerebral white matter redemonstrated, as above.  MRI Brain. FINDINGS: Brain: Study mildly degraded by motion artifact.  Diffuse prominence of the CSF containing spaces compatible with moderate age-related cerebral atrophy. Patchy and confluent T2/FLAIR hyperintensity within the periventricular and deep white matter both cerebral hemispheres most consistent chronic microvascular ischemic disease, also moderate nature. Scattered superimposed remote lacunar infarcts present within the left basal ganglia, bilateral  thalami, and bilateral cerebellar hemispheres.  On axial DWI sequence, there is a single subtle 5 mm focus of diffusion abnormality at the dorsal aspect of the pons/midbrain at the floor of the superior aspect of the fourth ventricle (series 4, image 17). This is faintly seen on coronal DWI sequence (series 5, image 16). Given symptoms, findings suspicious for possible tiny acute small vessel type infarct. No associated hemorrhage.  No other evidence for acute or subacute ischemia. Gray-white matter differentiation otherwise maintained. No other areas of remote cortical infarction. Single punctate chronic microhemorrhage noted within the left aspect of the pons. No other evidence for acute or chronic hemorrhage.  No mass lesion, midline shift or mass effect. Diffuse ventricular prominence related to global parenchymal volume loss without hydrocephalus. No extra-axial fluid collection.  Pituitary gland and suprasellar region within normal limits.  Vascular: Major intracranial vascular flow voids are maintained.  Skull and upper cervical spine: Craniocervical junction within normal limits. Visualized upper cervical spine within normal limits. Bone marrow signal intensity within normal limits. No scalp soft tissue abnormality.  Sinuses/Orbits: Globes and orbital soft tissues within normal limits. Patient status post lens extraction bilaterally. Mild scattered mucosal thickening within the ethmoidal air cells and right maxillary sinus. Paranasal sinuses are otherwise clear. No mastoid effusion. Inner ear structures normal.  IMPRESSION: 1. Subtle punctate 5 mm focus of diffusion abnormality at the dorsal aspect of the pons/midbrain at the floor of the fourth ventricle, suspicious for possible tiny acute ischemic small vessel type infarct. No associated hemorrhage. 2. No other acute intracranial process. 3. Moderate cerebral atrophy with chronic small vessel ischemic disease with remote  lacunar infarcts involving the left basal ganglia, bilateral thalami, and bilateral cerebellar hemispheres.   Assessment & Plan by Problem: Marc [name] is a 83 y.o.man eith PMHx Significant for HTN, CKD,CAD status post CABG, AAA status post graft placement on 04/26/17,prior strokes who presents to the ED with new onset diplopia and ataxia that began yesterday evening.   Internuclear ophthalmoplegia/brainstem infarct. A CT scan was negative for any hemorrhage, MRI shows a small pons/midbrain infarct, most likely the explanation for his symptoms. Neurology is following. There are recommending MRA, carotid Doppler, echo, lipid panel and A1c. MRI was done recently, we did not order MRA because of his  kidney disease and recent MRI. MRI was positive for only a small infarct involving a small vessel of the posterior circulation. Echo was recently done on 04/23/2017, which was negative for any vegetations or emboli, normal systolic function and grade 1 diastolic dysfunction. -Admit to telemetry. -Permissive hypertension. -rest of  stroke workup. -Aspirin 325 mg daily.  Hypertension.His home dose of metoprolol and Flomax were held because of permissive hypertension.  CKD.He has stage IIIB CKD. Creatinine at baseline. Did get a contrasted MRI. -Gentle IV fluids to flush out kidneys. -Repeat BMP in the morning  CAD.No active chest pain. EKG without any acute change and troponin negative.  AAA. He has an history of stent and graft repair approximately 5 years ago, on recent duplex ultrasound follow-up and found to have some enlargement of his aneurysm sac-Recently underwent Extension of right iliac stent graft for expansion of aneurysm sac. According to daughter he was doing well since his surgery.  CODE STATUS. Full VT prophylaxis. SCDs Diet. Heart healthy   Dispo: Admit patient to Observation with expected length of stay less than 2 midnights.  SignedLorella Nimrod, MD 05/04/2017,  11:58 PM  Pager: [phone]

## 2017-05-04 NOTE — ED Notes (Signed)
Urinal at bedside for sample, Pt unable to void at this time

## 2017-05-04 NOTE — ED Notes (Signed)
Pt states does not need to void at this time. Urinal and call light at bedside.

## 2017-05-04 NOTE — ED Provider Notes (Signed)
[facility] DEPT Provider Note   CSN: [id number] Arrival date & time: 05/04/17  8182     History   Chief Complaint Chief Complaint  Patient presents with  . Eye Problem  . Gait Problem    HPI TAYLAN MAREZ is a 83 y.o. male.  HPI  83 year old male with a history of hypertension, hyperlipidemia, CK D, CAD status post CABG, AAA status post graft placement on June 1 of this year, prior strokes who presents to the ED with new onset diplopia and ataxia that began yesterday evening. No associated aggravating or relieving factors. Daughter reports that she's noticed the patient's eyes making weird movements. Patient denies any other associated symptoms including focal weakness, headache, chest pain, shortness of breath, nausea, vomiting, abdominal pain.    Past Medical History:  Diagnosis Date  . AAA (abdominal aortic aneurysm) ([place])   . AAA (abdominal aortic aneurysm) ([place])   . Arthritis   . CKD (chronic kidney disease)    CKD III-IV (Dr. Posey Pronto, [facility])  . Coronary artery disease    s/p CABG Dr. Servando Snare, Dr. Acie Fredrickson  . Falls frequently   . Gall stones    patiet unsure  . GERD (gastroesophageal reflux disease)   . Gout   . History of Clostridium difficile infection   . History of kidney stones   . History of ulcerative colitis   . Hyperlipidemia   . Hypertension   . Kidney stone on left side    has not passed as of 09/23/12  . Stroke [facility]) 2012   affecting RUE    Patient Active Problem List   Diagnosis Date Noted  . Abdominal aortic aneurysm (AAA) ([place]) 04/26/2017  . Atherosclerosis of native arteries of the extremities with intermittent claudication 07/06/2014  . Pain in limb 07/06/2014  . Weakness of both legs 07/06/2014  . Numbness in both legs 07/06/2014  . Acute on chronic renal insufficiency 02/20/2013  . Essential hypertension 12/01/2012  . AAA (abdominal aortic aneurysm) ([place]) 10/28/2012  . Coronary artery disease 08/04/2012    Class:  Diagnosis of  . History of stroke without residual deficits 08/04/2012    Class: Diagnosis of  . Tobacco dependence 08/04/2012  . Abdominal aneurysm without mention of rupture 01/08/2012  . CLOSTRIDIUM DIFFICILE COLITIS 08/30/2009  . DIVERTICULOSIS-COLON 08/30/2009  . WEIGHT LOSS-ABNORMAL 08/30/2009  . PERSONAL HX COLONIC POLYPS 08/30/2009    Past Surgical History:  Procedure Laterality Date  . ABDOMINAL AORTIC ENDOVASCULAR STENT GRAFT  09/29/2012   Procedure: ABDOMINAL AORTIC ENDOVASCULAR STENT GRAFT;  Surgeon: Rosetta Posner, MD;  Location: [place];  Service: Vascular;  Laterality: N/A;  GORE; ultrasound guided.  . ABDOMINAL AORTIC ENDOVASCULAR STENT GRAFT Right 04/26/2017   Procedure: ABDOMINAL AORTIC ENDOVASCULAR STENT GRAFT- REVISE RIGHT AORTIC EXPANSION;  Surgeon: Rosetta Posner, MD;  Location: [place];  Service: Vascular;  Laterality: Right;  . APPENDECTOMY  07-2009   Ruptured appendix  . CARDIAC CATHETERIZATION    . CARDIOVASCULAR STRESS TEST  07/17/12  . COLONOSCOPY    . CORONARY ARTERY BYPASS GRAFT  08/05/2012   Procedure: CORONARY ARTERY BYPASS GRAFTING (CABG);  Surgeon: Grace Isaac, MD;  Location: [facility];  Service: Open Heart Surgery;  Laterality: N/A;  . ENDARTERECTOMY FEMORAL  09/29/2012   Procedure: ENDARTERECTOMY FEMORAL;  Surgeon: Rosetta Posner, MD;  Location: [place];  Service: Vascular;  Laterality: Right;  . FEMORAL ARTERY EXPLORATION  09/29/2012   Procedure: FEMORAL ARTERY EXPLORATION;  Surgeon: Rosetta Posner, MD;  Location:  [facility] OR;  Service: Vascular;  Laterality: Right;  . FRACTURE SURGERY     rt lower leg injured when hit by ca  . PATCH ANGIOPLASTY  09/29/2012   Procedure: PATCH ANGIOPLASTY;  Surgeon: Rosetta Posner, MD;  Location: [facility] OR;  Service: Vascular;  Laterality: Right;       Home Medications    Prior to Admission medications   Medication Sig Start Date End Date Taking? Authorizing Provider  acetaminophen (TYLENOL) 500 MG tablet Take 1,000 mg by mouth every 8  (eight) hours as needed for mild pain or headache.     [provider]  allopurinol (ZYLOPRIM) 100 MG tablet TAKE 200 mg BY MOUTH DAILY AFTER MEALS FOR GOUT 01/09/17   [provider]  aspirin EC 81 MG tablet Take 81 mg by mouth daily.    [provider]  cholecalciferol (VITAMIN D) 1000 units tablet Take 1,000 Units by mouth daily.    [provider]  Cyanocobalamin (B-12 PO) Take 1 tablet by mouth daily.    [provider]  escitalopram (LEXAPRO) 20 MG tablet Take 20 mg by mouth at bedtime.     [provider]  gabapentin (NEURONTIN) 100 MG capsule Take 100 mg by mouth at bedtime.  02/15/14   [provider]  guaiFENesin (MUCINEX) 600 MG 12 hr tablet Take 600 mg by mouth daily as needed for cough or to loosen phlegm.     [provider]  metoprolol (LOPRESSOR) 100 MG tablet TAKE 200 MG BY MOUTH EVERY DAY FOR HYPERTENSION 12/19/16   [provider]  Multiple Vitamin (MULTIVITAMIN WITH MINERALS) TABS Take 1 tablet by mouth daily.    [provider]  oxyCODONE-acetaminophen (PERCOCET/ROXICET) 5-325 MG tablet Take 1-2 tablets by mouth every 4 (four) hours as needed for moderate pain. 04/27/17   Alvia Grove, PA-C  ranitidine (ZANTAC) 150 MG capsule Take 150 mg by mouth at bedtime.     [provider]  sodium bicarbonate 650 MG tablet Take 650 mg by mouth 2 (two) times daily.     [provider]  sulfamethoxazole-trimethoprim (BACTRIM DS,SEPTRA DS) 800-160 MG tablet Take 1 tablet by mouth 2 (two) times daily. 04/27/17 05/04/17  Alvia Grove, PA-C  tamsulosin (FLOMAX) 0.4 MG CAPS capsule Take 0.4 mg by mouth at bedtime. 01/28/17   [provider]    Family History Family History  Problem Relation Age of Onset  . Stroke Mother   . Hypertension Mother   . Heart disease Sister        Aneurysm of the Brain    Social History Social History  Substance Use Topics  . Smoking status:  Current Some Day Smoker    Packs/day: 1.00    Years: 68.00    Types: Cigarettes  . Smokeless tobacco: Never Used     Comment: has 1-3 a week  . Alcohol use No     Allergies   No known allergies   Review of Systems Review of Systems All other systems are reviewed and are negative for acute change except as noted in the HPI   Physical Exam Updated Vital Signs BP (!) 183/79   Pulse (!) 55   Temp 98.5 F (36.9 C)   Resp 19   Ht 5\' 7"  (1.702 m)   Wt 71.7 kg (158 lb)   SpO2 97%   BMI 24.75 kg/m   Physical Exam  Constitutional: He is oriented to person, place, and time. He appears well-developed and  well-nourished. No distress.  HENT:  Head: Normocephalic and atraumatic.  Nose: Nose normal.  Eyes: Conjunctivae and EOM are normal. Pupils are equal, round, and reactive to light. Right eye exhibits no discharge. Left eye exhibits no discharge. No scleral icterus.  Neck: Normal range of motion. Neck supple.  Cardiovascular: Normal rate and regular rhythm.  Exam reveals no gallop and no friction rub.   No murmur heard. Pulmonary/Chest: Effort normal and breath sounds normal. No stridor. No respiratory distress. He has no rales.  Abdominal: Soft. He exhibits no distension. There is no tenderness.  Musculoskeletal: He exhibits no edema or tenderness.  Neurological: He is alert and oriented to person, place, and time.  Mental Status: Alert and oriented to person, place, and time. Attention and concentration normal. Speech clear. Recent memory is intact  Cranial Nerves  II Visual Fields: Impaired by diplopia but no hemianopsia. III, IV, VI: Pupils equal and reactive to light and near. Inability to adduct bilaterally with associated nystagmus on contralateral eye. V Facial Sensation: Normal. No weakness of masticatory muscles  VII: Mild right facial droop VIII Auditory Acuity: Grossly normal  IX/X: The uvula is midline; the palate elevates symmetrically  XI: Normal  sternocleidomastoid and trapezius strength  XII: The tongue is midline. No atrophy or fasciculations.   Motor System: Muscle Strength: 5/5 and symmetric in the upper and lower extremities. No pronation or drift.  Muscle Tone: Tone and muscle bulk are normal in the upper and lower extremities.   Reflexes: DTRs: 1+ and symmetrical in all four extremities. Plantar responses are flexor bilaterally.  Coordination: Intact finger-to-nose, heel-to-shin. No tremor.  Sensation: Intact to light touch, and pinprick. Positive Romberg test.  Gait: Ataxic    Skin: Skin is warm and dry. No rash noted. He is not diaphoretic. No erythema.  Psychiatric: He has a normal mood and affect.  Vitals reviewed.    ED Treatments / Results  Labs (all labs ordered are listed, but only abnormal results are displayed) Labs Reviewed  APTT - Abnormal; Notable for the following:       Result Value   aPTT 38 (*)    All other components within normal limits  CBC - Abnormal; Notable for the following:    RBC 4.19 (*)    Hemoglobin 12.6 (*)    HCT 38.3 (*)    All other components within normal limits  COMPREHENSIVE METABOLIC PANEL - Abnormal; Notable for the following:    Glucose, Bld 105 (*)    BUN 26 (*)    Creatinine, Ser 1.88 (*)    Calcium 8.4 (*)    Total Protein 6.4 (*)    Albumin 3.4 (*)    ALT 13 (*)    GFR calc non Af Amer 31 (*)    GFR calc Af Amer 36 (*)    All other components within normal limits  I-STAT CHEM 8, ED - Abnormal; Notable for the following:    BUN 29 (*)    Creatinine, Ser 1.90 (*)    Glucose, Bld 100 (*)    Calcium, Ion 1.13 (*)    Hemoglobin 12.6 (*)    HCT 37.0 (*)    All other components within normal limits  ETHANOL  PROTIME-INR  DIFFERENTIAL  RAPID URINE DRUG SCREEN, HOSP PERFORMED  URINALYSIS, ROUTINE W REFLEX MICROSCOPIC  I-STAT TROPOININ, ED    EKG  EKG Interpretation None       Radiology Ct Head Wo Contrast  Result Date: 05/04/2017 CLINICAL DATA:  83 year old male with history of visual changes and new onset of unsteady gait. EXAM: CT HEAD WITHOUT CONTRAST TECHNIQUE: Contiguous axial images were obtained from the base of the skull through the vertex without intravenous contrast. COMPARISON:  Head CT 02/23/2011. FINDINGS: Brain: Moderate cerebral and mild cerebellar atrophy. Patchy and confluent areas of decreased attenuation are noted throughout the deep and periventricular white matter of the cerebral hemispheres bilaterally, compatible with chronic microvascular ischemic disease. No evidence of acute infarction, hemorrhage, hydrocephalus, extra-axial collection or mass lesion/mass effect. Vascular: No hyperdense vessel or unexpected calcification. Skull: Normal. Negative for fracture or focal lesion. Sinuses/Orbits: No acute finding. Other: None. IMPRESSION: 1. No acute intracranial abnormalities. 2. Moderate cerebral and mild cerebellar atrophy with extensive chronic microvascular ischemic changes in the cerebral white matter redemonstrated, as above. Electronically Signed   By: Vinnie Langton M.D.   On: 05/04/2017 20:11    Procedures Procedures (including critical care time)  Medications Ordered in ED Medications - No data to display   Initial Impression / Assessment and Plan / ED Course  I have reviewed the triage vital signs and the nursing notes.  Pertinent labs & imaging results that were available during my care of the patient were reviewed by me and considered in my medical decision making (see chart for details).     Presentation concerning for brain stem CVA. CT head was unremarkable. Labs at patient's baseline and grossly reassuring.  Discussed case with Dr. Leonel Ramsay who evaluated the patient in the emergency department and agreed. Recommended admission and stroke workup.  Discussed case with medicine for admission.  Final Clinical Impressions(s) / ED Diagnoses   Final diagnoses:  Diplopia  Ataxia  INO  (internuclear ophthalmoplegia), bilateral      Cardama, Grayce Sessions, MD 05/04/17 2137

## 2017-05-04 NOTE — ED Notes (Signed)
Patient transported to CT 

## 2017-05-04 NOTE — ED Notes (Signed)
Neuro at bedside.

## 2017-05-04 NOTE — Consult Note (Signed)
Neurology Consultation Reason for Consult: Diplopia Referring Physician: [place], P  CC: Double vision  History is obtained from: Patient  HPI: Marc [name] is a 83 y.o. male began having double vision yesterday evening. This persisted throughout the day and therefore he came in seeking medical care today. His eyes were disconjugate, a CT head was performed which was negative.  He does note some difficulty walking, but denies any numbness, weakness, nausea, vomiting, other symptoms.   LKW: 6/8, evening tpa given?: no, out of window   ROS: A 14 point ROS was performed and is negative except as noted in the HPI.   Past Medical History:  Diagnosis Date  . AAA (abdominal aortic aneurysm) ([place])   . AAA (abdominal aortic aneurysm) ([place])   . Arthritis   . CKD (chronic kidney disease)    CKD III-IV (Dr. Posey Pronto, [facility])  . Coronary artery disease    s/p CABG Dr. Servando Snare, Dr. Acie Fredrickson  . Falls frequently   . Gall stones    patiet unsure  . GERD (gastroesophageal reflux disease)   . Gout   . History of Clostridium difficile infection   . History of kidney stones   . History of ulcerative colitis   . Hyperlipidemia   . Hypertension   . Kidney stone on left side    has not passed as of 09/23/12  . Stroke [facility]) 2012   affecting RUE     Family History  Problem Relation Age of Onset  . Stroke Mother   . Hypertension Mother   . Heart disease Sister        Aneurysm of the Brain     Social History:  reports that he has been smoking Cigarettes.  He has a 68.00 pack-year smoking history. He has never used smokeless tobacco. He reports that he does not drink alcohol or use drugs.   Exam: Current vital signs: BP (!) 196/85   Pulse (!) 50   Temp 98.5 F (36.9 C)   Resp 19   Ht 5\' 7"  (1.702 m)   Wt 71.7 kg (158 lb)   SpO2 96%   BMI 24.75 kg/m  Vital signs in last 24 hours: Temp:  [98.5 F (36.9 C)] 98.5 F (36.9 C) (06/09 2111) Pulse Rate:  [50-55] 50 (06/09  2200) Resp:  [16-20] 19 (06/09 2200) BP: (165-196)/(67-85) 196/85 (06/09 2200) SpO2:  [96 %-98 %] 96 % (06/09 2200) Weight:  [71.7 kg (158 lb)] 71.7 kg (158 lb) (06/09 1828)   Physical Exam  Constitutional: Appears well-developed and well-nourished.  Psych: Affect appropriate to situation Eyes: No scleral injection HENT: No OP obstrucion Head: Normocephalic.  Cardiovascular: Normal rate and regular rhythm.  Respiratory: Effort normal and breath sounds normal to anterior ascultation GI: Soft.  No distension. There is no tenderness.  Skin: WDI  Neuro: Mental Status: Patient is awake, alert, oriented to person, place, month, year, and situation. Patient is able to give a clear and coherent history. No signs of aphasia or neglect Cranial Nerves: II: Visual Fields are full. Pupils are equal, round, and reactive to light.   III,IV, VI: He has bilateral internuclear ophthalmoplegia with preserved upgaze and downgaze. V: Facial sensation is symmetric to temperature VII: Facial movement is symmetric.  VIII: hearing is intact to voice X: Uvula elevates symmetrically XI: Shoulder shrug is symmetric. XII: tongue is midline without atrophy or fasciculations.  Motor: Tone is normal. Bulk is normal. 5/5 strength was present in all four extremities.  Sensory: Sensation  is symmetric to light touch and temperature in the arms and legs. Cerebellar: FNF and HKS are intact on the right,? Some difficulty with finger-nose-finger on the left versus difficulties due to vision change, intact heel-knee-shin on the left   I have reviewed labs in epic and the results pertinent to this consultation are: Elevated creatinine at 1.88  I have reviewed the images obtained: CT head-negative  Impression: Bilateral internuclear ophthalmoplegia. In this clinical setting and his age with this history this is almost certainly a small brainstem stroke. He'll need to be admitted for stroke  workup.  Recommendations: 1. HgbA1c, fasting lipid panel 2. MRI, MRA  of the brain without contrast 3. Frequent neuro checks 4. Echocardiogram 5. Carotid dopplers 6. Prophylactic therapy-Antiplatelet med: Aspirin - dose 325mg  PO or 300mg  PR 7. Risk factor modification 8. Telemetry monitoring 9. PT consult, OT consult, Speech consult 10. please page stroke NP  Or  PA  Or MD  from 8am -4 pm as this patient will be followed by the stroke team at this point.   You can look them up on [email or web address]      Roland Rack, MD [facility] [phone]  If 7pm- 7am, please page neurology on call as listed in [place].

## 2017-05-04 NOTE — ED Triage Notes (Signed)
Pt from home via EMS with vision changes and new onset unsteady gait. LSN 8 PM last night. Pt normally ambulatory however since last night has been unsteady on his feet and has needed a walker to ambulate. Per EMS, ataxia noted, grips equal and strong. 12 lead unremarkable, 160/78, 18 RR, HR 55, 96% RA, CBG 103. Hx of CVA with R sided deficits, pt on blood thinners, unsure which. Recent aortic stent placement June 2018.

## 2017-05-04 NOTE — ED Notes (Signed)
Attempted report x1. 

## 2017-05-04 NOTE — ED Notes (Signed)
Patient transported to MRI 

## 2017-05-04 NOTE — ED Notes (Signed)
Pt returned from MRI °

## 2017-05-05 ENCOUNTER — Observation Stay (HOSPITAL_COMMUNITY): Payer: Medicare Other

## 2017-05-05 ENCOUNTER — Encounter (HOSPITAL_COMMUNITY): Payer: Self-pay

## 2017-05-05 ENCOUNTER — Encounter (HOSPITAL_COMMUNITY): Payer: Medicare Other

## 2017-05-05 DIAGNOSIS — I129 Hypertensive chronic kidney disease with stage 1 through stage 4 chronic kidney disease, or unspecified chronic kidney disease: Secondary | ICD-10-CM | POA: Diagnosis present

## 2017-05-05 DIAGNOSIS — I714 Abdominal aortic aneurysm, without rupture: Secondary | ICD-10-CM | POA: Diagnosis not present

## 2017-05-05 DIAGNOSIS — Z8673 Personal history of transient ischemic attack (TIA), and cerebral infarction without residual deficits: Secondary | ICD-10-CM | POA: Diagnosis not present

## 2017-05-05 DIAGNOSIS — I638 Other cerebral infarction: Secondary | ICD-10-CM | POA: Diagnosis not present

## 2017-05-05 DIAGNOSIS — I2510 Atherosclerotic heart disease of native coronary artery without angina pectoris: Secondary | ICD-10-CM | POA: Diagnosis present

## 2017-05-05 DIAGNOSIS — Z951 Presence of aortocoronary bypass graft: Secondary | ICD-10-CM

## 2017-05-05 DIAGNOSIS — K219 Gastro-esophageal reflux disease without esophagitis: Secondary | ICD-10-CM | POA: Diagnosis present

## 2017-05-05 DIAGNOSIS — F17210 Nicotine dependence, cigarettes, uncomplicated: Secondary | ICD-10-CM | POA: Diagnosis not present

## 2017-05-05 DIAGNOSIS — H5123 Internuclear ophthalmoplegia, bilateral: Secondary | ICD-10-CM | POA: Diagnosis present

## 2017-05-05 DIAGNOSIS — N184 Chronic kidney disease, stage 4 (severe): Secondary | ICD-10-CM | POA: Diagnosis present

## 2017-05-05 DIAGNOSIS — N183 Chronic kidney disease, stage 3 (moderate): Secondary | ICD-10-CM

## 2017-05-05 DIAGNOSIS — M109 Gout, unspecified: Secondary | ICD-10-CM | POA: Diagnosis present

## 2017-05-05 DIAGNOSIS — Z79899 Other long term (current) drug therapy: Secondary | ICD-10-CM | POA: Diagnosis not present

## 2017-05-05 DIAGNOSIS — I10 Essential (primary) hypertension: Secondary | ICD-10-CM | POA: Diagnosis not present

## 2017-05-05 DIAGNOSIS — I639 Cerebral infarction, unspecified: Secondary | ICD-10-CM | POA: Diagnosis present

## 2017-05-05 DIAGNOSIS — H532 Diplopia: Secondary | ICD-10-CM | POA: Diagnosis present

## 2017-05-05 LAB — RAPID URINE DRUG SCREEN, HOSP PERFORMED
AMPHETAMINES: NOT DETECTED
BENZODIAZEPINES: NOT DETECTED
Barbiturates: NOT DETECTED
Cocaine: NOT DETECTED
OPIATES: NOT DETECTED
TETRAHYDROCANNABINOL: NOT DETECTED

## 2017-05-05 LAB — BASIC METABOLIC PANEL
Anion gap: 10 (ref 5–15)
BUN: 26 mg/dL — ABNORMAL HIGH (ref 6–20)
CALCIUM: 9 mg/dL (ref 8.9–10.3)
CO2: 24 mmol/L (ref 22–32)
CREATININE: 1.78 mg/dL — AB (ref 0.61–1.24)
Chloride: 103 mmol/L (ref 101–111)
GFR, EST AFRICAN AMERICAN: 38 mL/min — AB (ref >60)
GFR, EST NON AFRICAN AMERICAN: 33 mL/min — AB (ref >60)
Glucose, Bld: 92 mg/dL (ref 65–99)
Potassium: 3.8 mmol/L (ref 3.5–5.1)
SODIUM: 137 mmol/L (ref 135–145)

## 2017-05-05 MED ORDER — GABAPENTIN 100 MG PO CAPS
200.0000 mg | ORAL_CAPSULE | Freq: Every day | ORAL | Status: DC
  Administered 2017-05-05: 200 mg via ORAL
  Filled 2017-05-05: qty 2

## 2017-05-05 MED ORDER — GABAPENTIN 100 MG PO CAPS: 200.0000 mg | ORAL_CAPSULE | Freq: Every day | ORAL | Status: DC

## 2017-05-05 MED ORDER — ASPIRIN EC 325 MG PO TBEC
325.0000 mg | DELAYED_RELEASE_TABLET | Freq: Every day | ORAL | Status: DC
  Administered 2017-05-05: 325 mg via ORAL
  Filled 2017-05-05 (×2): qty 1

## 2017-05-05 MED ORDER — CLOPIDOGREL BISULFATE 75 MG PO TABS
75.0000 mg | ORAL_TABLET | Freq: Every day | ORAL | Status: DC
  Administered 2017-05-06: 75 mg via ORAL
  Filled 2017-05-05: qty 1

## 2017-05-05 MED ORDER — FAMOTIDINE 20 MG PO TABS: 20.0000 mg | ORAL_TABLET | Freq: Two times a day (BID) | ORAL | Status: DC | PRN

## 2017-05-05 MED ORDER — ADULT MULTIVITAMIN W/MINERALS CH
1.0000 | ORAL_TABLET | Freq: Every day | ORAL | Status: DC
  Administered 2017-05-05 – 2017-05-06 (×2): 1 via ORAL
  Filled 2017-05-05 (×2): qty 1

## 2017-05-05 MED ORDER — ESCITALOPRAM OXALATE 10 MG PO TABS
20.0000 mg | ORAL_TABLET | Freq: Every day | ORAL | Status: DC
  Administered 2017-05-05 (×2): 20 mg via ORAL
  Filled 2017-05-05 (×2): qty 2

## 2017-05-05 MED ORDER — VITAMIN D 1000 UNITS PO TABS
1000.0000 [IU] | ORAL_TABLET | Freq: Every day | ORAL | Status: DC
  Administered 2017-05-05 – 2017-05-06 (×2): 1000 [IU] via ORAL
  Filled 2017-05-05 (×2): qty 1

## 2017-05-05 MED ORDER — STROKE: EARLY STAGES OF RECOVERY BOOK
Freq: Once | Status: AC
  Administered 2017-05-05: 1

## 2017-05-05 MED ORDER — GABAPENTIN 100 MG PO CAPS
100.0000 mg | ORAL_CAPSULE | Freq: Every day | ORAL | Status: DC
  Administered 2017-05-05: 100 mg via ORAL
  Filled 2017-05-05: qty 1

## 2017-05-05 MED ORDER — ROSUVASTATIN CALCIUM 20 MG PO TABS
20.0000 mg | ORAL_TABLET | Freq: Every day | ORAL | Status: DC
  Administered 2017-05-05 – 2017-05-06 (×2): 20 mg via ORAL
  Filled 2017-05-05 (×2): qty 1

## 2017-05-05 MED ORDER — SODIUM CHLORIDE 0.9 % IV SOLN
INTRAVENOUS | Status: AC
  Administered 2017-05-05: 1 mL via INTRAVENOUS

## 2017-05-05 MED ORDER — SODIUM BICARBONATE 650 MG PO TABS
650.0000 mg | ORAL_TABLET | Freq: Two times a day (BID) | ORAL | Status: DC
  Administered 2017-05-05 – 2017-05-06 (×4): 650 mg via ORAL
  Filled 2017-05-05 (×4): qty 1

## 2017-05-05 NOTE — Progress Notes (Signed)
   05/04/17 2111  Step #1 Pre-Swallow Screen  History of dysphagia No  Dysphagia diet prior to admission No  Awake and alert, responding to speech? Yes  Positioned upright, with some head control? Yes  Cough on command? Yes  Maintain control of their saliva? Yes  Lick top and bottom lip? Yes  Breathe freely and maintain O2 sats? Yes  Voice quality clear (No "wet" gurgly or hoarse sounds) Yes  R Upper  Breath Sounds Clear  L Upper Breath Sounds Clear  R Lower Breath Sounds Clear  L Lower Breath Sounds Clear  Temp 98.5 F (36.9 C)  Step #2 Swallow Screen  Patient was kept strictly NPO prior to this screen Yes  Water via cup No problems  Water via straw No problems  Cracker No problems  Lung sounds changed after swallow screen No  Passed swallow screen Yes, see row information

## 2017-05-05 NOTE — Evaluation (Signed)
Physical Therapy Evaluation Patient Details Name: Marc [name] MRN: [id number] DOB: [birth year] Today's Date: 05/05/2017   History of Present Illness  Patient admitted 05/04/17 with difficulty ambulating and double vision and was found to have intranuclear ophthalmoplegia on exam and a small pontine/midbrain infarct on his MRI which is the most likely cause for his symptoms per MD notes.,     PMH:  previous strokes, hypertension, ongoing tobacco use, hyperlipidemia, history of C. difficile, coronary artery disease, chronic kidney disease, and AAA.       Clinical Impression  Patient presents with problems listed below.  Will benefit from acute PT to maximize independence prior to discharge. Patient with vision changes with disconjugate movement of eyes, and difficulty with horizontal movements of eyes.  Patient with diplopia impacting mobility, balance, and gait.  Feel eye patch would be helpful.  Recommend f/u HHPT at d/c for continued therapy.     Follow Up Recommendations Home health PT;Supervision for mobility/OOB    Equipment Recommendations  None recommended by PT    Recommendations for Other Services       Precautions / Restrictions Precautions Precautions: Fall Precaution Comments: diplopia impacting gait and safety.  Would benefit from patch to assist with vision. Restrictions Weight Bearing Restrictions: No      Mobility  Bed Mobility Overal bed mobility: Needs Assistance Bed Mobility: Supine to Sit;Sit to Supine     Supine to sit: Min guard Sit to supine: Min guard   General bed mobility comments: Assist for safety  Transfers Overall transfer level: Needs assistance Equipment used: Rolling walker (2 wheeled) Transfers: Sit to/from Stand Sit to Stand: Min guard         General transfer comment: Verbal cues for hand placement.  Assist for safety.  Ambulation/Gait Ambulation/Gait assistance: Min guard Ambulation Distance (Feet): 180 Feet Assistive device:  Rolling walker (2 wheeled) Gait Pattern/deviations: Step-through pattern;Decreased stride length;Drifts right/left;Trunk flexed Gait velocity: decreased Gait velocity interpretation: Below normal speed for age/gender General Gait Details: Verbal cues for patient to hold head upright.  Patient closing 1 eye during gait to eliminate diplopia.  Patient steady with use of RW.  Stairs            Wheelchair Mobility    Modified Rankin (Stroke Patients Only) Modified Rankin (Stroke Patients Only) Pre-Morbid Rankin Score: Slight disability Modified Rankin: Moderately severe disability     Balance Overall balance assessment: Needs assistance Sitting-balance support: No upper extremity supported Sitting balance-Leahy Scale: Good     Standing balance support: Bilateral upper extremity supported Standing balance-Leahy Scale: Poor                               Pertinent Vitals/Pain Pain Assessment: No/denies pain    Home Living Family/patient expects to be discharged to:: Private residence Living Arrangements: Spouse/significant other Available Help at Discharge: Family;Available 24 hours/day (Wife, daughter, son) Type of Home: House Home Access: Ramped entrance     Home Layout: One level Home Equipment: [place] - 2 wheels;Shower seat;Bedside commode;Wheelchair - manual      Prior Function Level of Independence: Independent with assistive device(s)         Comments: Reports he occasionally used RW..  Does not drive.  Daughter and grandson helps with transportation.     Hand Dominance        Extremity/Trunk Assessment   Upper Extremity Assessment Upper Extremity Assessment: Defer to OT evaluation    Lower Extremity  Assessment Lower Extremity Assessment: Overall WFL for tasks assessed (Heel-to-shin normal)       Communication   Communication: No difficulties  Cognition Arousal/Alertness: Awake/alert Behavior During Therapy: WFL for tasks  assessed/performed;Flat affect Overall Cognitive Status: Within Functional Limits for tasks assessed                                        General Comments      Exercises     Assessment/Plan    PT Assessment Patient needs continued PT services  PT Problem List Decreased balance;Decreased mobility;Decreased knowledge of use of DME;Decreased knowledge of precautions       PT Treatment Interventions DME instruction;Gait training;Functional mobility training;Therapeutic activities;Therapeutic exercise;Balance training;Patient/family education    PT Goals (Current goals can be found in the Care Plan section)  Acute Rehab PT Goals Patient Stated Goal: To go home PT Goal Formulation: With patient Time For Goal Achievement: 05/12/17 Potential to Achieve Goals: Good    Frequency Min 3X/week   Barriers to discharge        Co-evaluation               AM-PAC PT "6 Clicks" Daily Activity  Outcome Measure Difficulty turning over in bed (including adjusting bedclothes, sheets and blankets)?: None Difficulty moving from lying on back to sitting on the side of the bed? : A Little Difficulty sitting down on and standing up from a chair with arms (e.g., wheelchair, bedside commode, etc,.)?: A Little Help needed moving to and from a bed to chair (including a wheelchair)?: A Little Help needed walking in hospital room?: A Little Help needed climbing 3-5 steps with a railing? : A Lot 6 Click Score: 18    End of Session Equipment Utilized During Treatment: Gait belt Activity Tolerance: Patient tolerated treatment well Patient left: in bed;with call bell/phone within reach;with bed alarm set Nurse Communication: Mobility status PT Visit Diagnosis: Unsteadiness on feet (R26.81);Other abnormalities of gait and mobility (R26.89)    Time: 6301-6010 PT Time Calculation (min) (ACUTE ONLY): 22 min   Charges:   PT Evaluation $PT Eval Moderate Complexity: 1 Procedure      PT G Codes:        Carita Pian. Sanjuana Kava, [facility] Acute Rehab Services Pager [place] 05/05/2017, 8:49 PM

## 2017-05-05 NOTE — Progress Notes (Signed)
STROKE TEAM PROGRESS NOTE   HISTORY OF PRESENT ILLNESS (per record) Pedram Goodchild Enoch is a 83 y.o. male began having double vision yesterday evening. This persisted throughout the day and therefore he came in seeking medical care today. His eyes were disconjugate, a CT head was performed which was negative. He does note some difficulty walking, but denies any numbness, weakness, nausea, vomiting, other symptoms.  LKW: 6/8, evening tpa given?: no, out of window   SUBJECTIVE (INTERVAL HISTORY) No family members present. The patient is still experiencing diplopia. This resolves when covering one eye.   OBJECTIVE Temp:  [98.5 F (36.9 C)-98.7 F (37.1 C)] 98.7 F (37.1 C) (06/10 0001) Pulse Rate:  [49-104] 52 (06/10 0608) Cardiac Rhythm: Sinus bradycardia (06/10 0024) Resp:  [8-22] 16 (06/10 0416) BP: (165-200)/(64-165) 167/72 (06/10 0608) SpO2:  [95 %-99 %] 96 % (06/10 0608) Weight:  [68.5 kg (151 lb 1.6 oz)-71.7 kg (158 lb)] 69 kg (152 lb 3.2 oz) (06/10 0024)  CBC:  Recent Labs Lab 05/04/17 1957 05/04/17 2010  WBC 7.0  --   NEUTROABS 4.8  --   HGB 12.6* 12.6*  HCT 38.3* 37.0*  MCV 91.4  --   PLT 151  --     Basic Metabolic Panel:  Recent Labs Lab 05/04/17 1957 05/04/17 2010 05/05/17 0339  NA 135 140 137  K 3.9 4.0 3.8  CL 102 103 103  CO2 25  --  24  GLUCOSE 105* 100* 92  BUN 26* 29* 26*  CREATININE 1.88* 1.90* 1.78*  CALCIUM 8.4*  --  9.0    Lipid Panel:    Component Value Date/Time   CHOL 163 04/24/2017 0955   TRIG 189 (H) 04/24/2017 0955   HDL 36 (L) 04/24/2017 0955   CHOLHDL 4.5 04/24/2017 0955   CHOLHDL 3 08/19/2013 1254   VLDL 29.6 08/19/2013 1254   LDLCALC 89 04/24/2017 0955   HgbA1c:  Lab Results  Component Value Date   HGBA1C 5.7 (H) 08/04/2012   Urine Drug Screen:    Component Value Date/Time   LABOPIA NONE DETECTED 05/04/2017 2339   COCAINSCRNUR NONE DETECTED 05/04/2017 2339   LABBENZ NONE DETECTED 05/04/2017 2339   AMPHETMU NONE  DETECTED 05/04/2017 2339   THCU NONE DETECTED 05/04/2017 2339   LABBARB NONE DETECTED 05/04/2017 2339    Alcohol Level     Component Value Date/Time   ETH <5 05/04/2017 1934    IMAGING  Ct Head Wo Contrast 05/04/2017 1. No acute intracranial abnormalities.  2. Moderate cerebral and mild cerebellar atrophy with extensive chronic microvascular ischemic changes in the cerebral white matter redemonstrated, as above.    Mr Brain Wo Contrast 05/04/2017 1. Subtle punctate 5 mm focus of diffusion abnormality at the dorsal aspect of the pons/midbrain at the floor of the fourth ventricle, suspicious for possible tiny acute ischemic small vessel type infarct. No associated hemorrhage.  2. No other acute intracranial process.  3. Moderate cerebral atrophy with chronic small vessel ischemic disease with remote lacunar infarcts involving the left basal ganglia, bilateral thalami, and bilateral cerebellar hemispheres.    Mr Jodene Nam Head Wo Contrast Mr Jodene Nam Neck Wo Contrast 05/05/2017 1. No vertebral artery or significant basilar artery stenosis. Proximal Basilar artery atherosclerosis has increased since 2012.  2. Chronic right PCA atherosclerosis has progressed and is now severe, although the vessel remains patent.  3. Increased proximal left ICA atherosclerosis in the neck since 2012, now with stenosis estimated at 60%.  4. Increased bilateral MCA atherosclerosis since  2012, with new mild stenosis at both distal M1 segments.    PHYSICAL EXAM Pleasant frail elderly Caucasian male not in distress. . Afebrile. Head is nontraumatic. Neck is supple without bruit.    Cardiac exam no murmur or gallop. Lungs are clear to auscultation. Distal pulses are well felt.  Neurological Exam ;  Awake  Alert oriented x 3. Normal speech and language. By lateral intermittent nuclear ophthalmoplegia with absent adduction of both eyes with nystagmus on lateral gaze with overc. No restriction of vertical eye movements No  ptosis. Pupils are equal reactive..fundi were not visualized. Vision acuity and fields appear normal. Hearing is normal. Palatal movements are normal. Face symmetric. Tongue midline. Normal strength, tone, reflexes and coordination. Normal sensation. Gait deferred.   ASSESSMENT/PLAN Mr. EDIS HUISH is a 83 y.o. male with history of previous strokes, hypertension, ongoing tobacco use, hyperlipidemia, history of C. difficile, coronary artery disease, chronic kidney disease, and AAA presenting with diplopia with disconjugate eye movements. He did not receive IV t-PA due to late presentation.  Stroke:  5 mm focus of diffusion abnormality at the dorsal aspect of the pons/midbrain. Likely small vessel disease.  Resultant  Bilateral internal nuclear ophthalmoplegia    CT head - No acute intracranial abnormalities.   MRI Head -  5 mm focus of diffusion abnormality at the dorsal aspect of the pons/midbrain. Remote infarcts.  MRA Head & Neck - 60% stenosis left ICA. Severe right PCA stenosis.  Carotid Doppler - cancel - MRA neck  2D Echo - 04/23/2017 - no cardiac source of emboli identified.  LDL - 89  HgbA1c pending  VTE prophylaxis - SCDs  Diet Heart Room service appropriate? Yes; Fluid consistency: Thin  aspirin 81 mg daily prior to admission, now on aspirin 325 mg daily Change to Plavix  Patient counseled to be compliant with his antithrombotic medications  Ongoing aggressive stroke risk factor management  Therapy recommendations: pending   Disposition: Pending  Hypertension  Stable  Permissive hypertension (OK if < 220/120) but gradually normalize in 5-7 days  Long-term BP goal normotensive  Hyperlipidemia  Home meds:  No lipid lowering medications prior to admission  LDL 89, goal < 70  Now on Crestor 20 mg daily  Continue statin at discharge   Other Stroke Risk Factors  Advanced age  Cigarette smoker - advised to stop smoking  Hx stroke/TIA  Family hx  stroke (mother)  Coronary artery disease   Other Active Problems  Chronic kidney disease - 45 / 1.78  60% left internal carotid artery stenosis - will need to be followed.  Hospital day # 0  Mikey Bussing PA-C [facility] Pager [phone] 05/05/2017, 5:05 PM I have personally examined this patient, reviewed notes, independently viewed imaging studies, participated in medical decision making and plan of care.ROS completed by me personally and pertinent positives fully documented  I have made any additions or clarifications directly to the above note. Agree with note above. He presented with diplopia do you to bilateral intranuclear ophthalmoplegia from tiny brainstem infarct likely from small vessel disease. Change aspirin to Plavix for stroke prevention and continue o stroke workup. Greater than 50% time during this 35 minute visit was spent on counseling and coordination of care about his lacunar infarct, stroke prevention and treatment discussion   Antony Contras, MD Medical Director [facility] Pager: [phone] 05/05/2017 5:29 PM    To contact Stroke Continuity provider, please refer to [email or web address] After hours, contact General Neurology

## 2017-05-05 NOTE — Plan of Care (Signed)
Problem: Education: Goal: Knowledge of patient specific risk factors addressed and post discharge goals established will improve Outcome: Progressing RN discussed HTN, HLD, CAD, sleep apnea, importance of medication compliance and seeing PCP regularly with patient and patients children. Patient v/u.  Problem: Nutrition: Goal: Risk of aspiration will decrease Outcome: Completed/Met Date Met: 05/05/17 Patient passed bedside stroke swallow screen in ED. RN will notify MD and continue to monitor patient

## 2017-05-05 NOTE — Progress Notes (Signed)
SCDs ordered for VTE per Dr. Reesa Chew. MD notified of patients HR and BP. RN obtaining EKG due to patient cardiac rhythm being SB since arrival to unit. RN to notify MD if SBP >220 and PRN orders will be placed at that time. RN will continue to monitor.

## 2017-05-05 NOTE — Progress Notes (Signed)
Patient arrived to unit alert and oriented x4. Patients son and daughter at bedside. Patient and family oriented to unit. Patients PMH, drug allergies, and medications reviewed and confirmed by patient and patient daughter/POA, [name]. RN explained and discussed patients current treatment plan  with patient and family. Patient and family v/u. Call light and telephone placed within patients reach. q 2 hour neuro checks initiated. RN will continue to monitor patient.

## 2017-05-05 NOTE — Progress Notes (Signed)
Patient c/o 6/10 constant, progressively worsening burning, tingling pain in right and left foot. MD paged. MD increasing gabapentin to 200 mg PO q HS and adjusting administration time. RN will continue to monitor patient.

## 2017-05-05 NOTE — Progress Notes (Signed)
   Subjective: No changes this morning. Patient still having blurry vision and diplopia.  Objective:  Vital signs in last 24 hours: Vitals:   05/05/17 0600 05/05/17 0608 05/05/17 0800 05/05/17 1000  BP: (!) 179/165 (!) 167/72 (!) 191/67 (!) 163/72  Pulse: (!) 52 (!) 52 95 (!) 53  Resp:   18 20  Temp:   97.8 F (36.6 C) 98.1 F (36.7 C)  TempSrc:   Oral Oral  SpO2: 95% 96% 97% 98%  Weight:      Height:       Physical Exam  Constitutional: He is oriented to person, place, and time. He appears well-developed.  HENT:  Head: Normocephalic and atraumatic.  Eyes:  Extraocular movements limited with adduction  Cardiovascular: Normal rate and regular rhythm.  Exam reveals no friction rub.   No murmur heard. Respiratory: Effort normal and breath sounds normal. No respiratory distress. He has no wheezes.  GI: Soft. Bowel sounds are normal. He exhibits no distension. There is no tenderness.  Musculoskeletal: He exhibits no edema.  Neurological: He is alert and oriented to person, place, and time.     Assessment/Plan:  Active Problems:   CVA (cerebral vascular accident) ([place])  # Internuclear ophthalmoplegia/brainstem infarct.  A CT scan was negative for any hemorrhage, MRI shows a small pons/midbrain infarct, most likely the explanation for his symptoms. Neurology is following. We'll proceed with stroke workup to best manage secondary prevention options. -- Aspirin 325 mg daily -- Rosuvastatin 20 mg daily -- Stroke workup -- Follow neuro recs   # Hypertension. -- Permissive hypertension  # CKD -- Continue to monitor  # CAD. No active chest pain. EKG without any acute change and troponin negative.  # AAA.  He has an history of stent and graft repair approximately 5 years ago, on recent duplex ultrasound follow-up and found to have some enlargement of his aneurysm sac-Recently underwent Extension of right iliac stent graft for expansion of aneurysm sac. According to  daughter he was doing well since his surgery.  CODE STATUS. Full VT prophylaxis. SCDs Diet. Heart healthy  Dispo: Anticipated discharge tomorrow pending stroke workup.  Ophelia Shoulder, MD 05/05/2017, 10:57 AM Pager: [phone]

## 2017-05-05 NOTE — Progress Notes (Signed)
OT Cancellation Note  Patient Details Name: Marc [name] MRN: [id number] DOB: Jun 02, 1930   Cancelled Treatment:    Reason Eval/Treat Not Completed: Patient at procedure or test/ unavailable. Pt leaving unit for MRA on arrival. Will check back as able for OT evaluation. Thank you for this referral!  Norman Herrlich, MS OTR/L  Pager: [name] 05/05/2017, 8:38 AM

## 2017-05-06 DIAGNOSIS — H532 Diplopia: Secondary | ICD-10-CM

## 2017-05-06 DIAGNOSIS — Z79899 Other long term (current) drug therapy: Secondary | ICD-10-CM

## 2017-05-06 DIAGNOSIS — I639 Cerebral infarction, unspecified: Principal | ICD-10-CM

## 2017-05-06 DIAGNOSIS — I10 Essential (primary) hypertension: Secondary | ICD-10-CM

## 2017-05-06 DIAGNOSIS — H5123 Internuclear ophthalmoplegia, bilateral: Secondary | ICD-10-CM

## 2017-05-06 LAB — HEMOGLOBIN A1C
Hgb A1c MFr Bld: 5.4 % (ref 4.8–5.6)
Mean Plasma Glucose: 108 mg/dL

## 2017-05-06 MED ORDER — ROSUVASTATIN CALCIUM 20 MG PO TABS: 20.0000 mg | ORAL_TABLET | Freq: Every day | ORAL | 1 refills | Status: AC

## 2017-05-06 MED ORDER — CLOPIDOGREL BISULFATE 75 MG PO TABS: 75.0000 mg | ORAL_TABLET | Freq: Every day | ORAL | 1 refills | Status: AC

## 2017-05-06 NOTE — Progress Notes (Signed)
Occupational Therapy Treatment Patient Details Name: Marc [name] MRN: [id number] DOB: [birth year] Today's Date: 05/06/2017    History of present illness Patient admitted 05/04/17 with difficulty ambulating and double vision and was found to have intranuclear ophthalmoplegia on exam and a small pontine/midbrain infarct on his MRI which is the most likely cause for his symptoms per MD notes.,     PMH:  previous strokes, hypertension, ongoing tobacco use, hyperlipidemia, history of C. difficile, coronary artery disease, chronic kidney disease, and AAA.     OT comments  Completed education with pt/son regarding use of patch, home safety, need for S with all mobility and ADL. Continue to recommend follow up with [place]. Written information given.   Follow Up Recommendations  Home health OT;Supervision/Assistance - 24 hour    Equipment Recommendations  None recommended by OT    Recommendations for Other Services      Precautions / Restrictions Precautions Precautions: Fall Precaution Comments: diplopia       Mobility Bed Mobility                  Transfers Overall transfer level: Needs assistance Equipment used: Rolling walker (2 wheeled) Transfers: Sit to/from [facility] Sit to Stand: Min guard Stand pivot transfers: Min guard            Balance Overall balance assessment: Needs assistance         Standing balance support: Bilateral upper extremity supported Standing balance-Leahy Scale: Poor Standing balance comment: reliant on RW                           ADL either performed or assessed with clinical judgement   ADL                                       Functional mobility during ADLs: Min guard;Rolling walker;Cueing for safety General ADL Comments: Educated pt/son on home safety, home modifications to increase safety and reduce risk of falls; pt needs S with mobility for ADL at this time due to fall risk. Pt/son  verbalized understanding.     Vision   Vision Assessment?: Yes Additional Comments: increased adduction of L eye today. Pt given eye patch and educated to alternate patch every 2 hours. When alternating patch, pt instructed on visual activities to address occular ROM/eye hand coordination. Also educated on activities to address compensation for impaired depth perception   Perception     Praxis      Cognition Arousal/Alertness: Awake/alert Behavior During Therapy: WFL for tasks assessed/performed;Flat affect Overall Cognitive Status: Within Functional Limits for tasks assessed                                          Exercises     Shoulder Instructions       General Comments      Pertinent Vitals/ Pain       Pain Assessment: No/denies pain  Home Living                                          Prior Functioning/Environment  Frequency  Min 3X/week        Progress Toward Goals  OT Goals(current goals can now be found in the care plan section)  Progress towards OT goals: Progressing toward goals  Acute Rehab OT Goals Patient Stated Goal: To go home OT Goal Formulation: With patient Time For Goal Achievement: 05/20/17 Potential to Achieve Goals: Good ADL Goals Pt Will Perform Grooming: with set-up;standing Pt Will Perform Lower Body Bathing: with set-up;sit to/from stand Pt Will Perform Lower Body Dressing: with set-up;sit to/from stand Pt Will Transfer to Toilet: with supervision;ambulating Pt/caregiver will Perform Home Exercise Program: With written HEP provided Additional ADL Goal #1: pt/family will independently verbalize understanding of need to alternate eye patch q 2 hrs to improve funcitonal vision  Plan Discharge plan remains appropriate    Co-evaluation                 AM-PAC PT "6 Clicks" Daily Activity     Outcome Measure   Help from another person eating meals?: A Little Help from  another person taking care of personal grooming?: A Little Help from another person toileting, which includes using toliet, bedpan, or urinal?: A Little Help from another person bathing (including washing, rinsing, drying)?: A Little Help from another person to put on and taking off regular upper body clothing?: None Help from another person to put on and taking off regular lower body clothing?: A Little 6 Click Score: 19    End of Session Equipment Utilized During Treatment: Rolling walker  OT Visit Diagnosis: Unsteadiness on feet (R26.81);Other abnormalities of gait and mobility (R26.89);Low vision, both eyes (H54.2)   Activity Tolerance Patient tolerated treatment well   Patient Left in chair;with call bell/phone within reach;with family/visitor present   Nurse Communication Mobility status        Time: 1429-1500 OT Time Calculation (min): 31 min  Charges: OT General Charges $OT Visit: 1 Procedure OT Treatments $Self Care/Home Management : 8-22 mins $Therapeutic Activity: 8-22 mins  [facility], OT/L  [phone] 05/06/2017   Marc [name],HILLARY 05/06/2017, 3:07 PM

## 2017-05-06 NOTE — Progress Notes (Signed)
STROKE TEAM PROGRESS NOTE   HISTORY OF PRESENT ILLNESS (per record) Marc [name] is a 83 y.o. male began having double vision yesterday evening. This persisted throughout the day and therefore he came in seeking medical care today. His eyes were disconjugate, a CT head was performed which was negative. He does note some difficulty walking, but denies any numbness, weakness, nausea, vomiting, other symptoms.  LKW: 6/8, evening tpa given?: no, out of window   SUBJECTIVE (INTERVAL HISTORY) No family members present. The patient is still experiencing diplopia.but he is wearing an eye patch which has helped  OBJECTIVE Temp:  [98 F (36.7 C)-98.2 F (36.8 C)] 98.2 F (36.8 C) (06/11 0906) Pulse Rate:  [51-55] 51 (06/11 0906) Cardiac Rhythm: Sinus bradycardia (06/11 0700) Resp:  [6-18] 6 (06/11 0906) BP: (156-178)/(64-77) 177/64 (06/11 0906) SpO2:  [98 %-100 %] 98 % (06/11 0906)  CBC:   Recent Labs Lab 05/04/17 1957 05/04/17 2010  WBC 7.0  --   NEUTROABS 4.8  --   HGB 12.6* 12.6*  HCT 38.3* 37.0*  MCV 91.4  --   PLT 151  --     Basic Metabolic Panel:   Recent Labs Lab 05/04/17 1957 05/04/17 2010 05/05/17 0339  NA 135 140 137  K 3.9 4.0 3.8  CL 102 103 103  CO2 25  --  24  GLUCOSE 105* 100* 92  BUN 26* 29* 26*  CREATININE 1.88* 1.90* 1.78*  CALCIUM 8.4*  --  9.0    Lipid Panel:     Component Value Date/Time   CHOL 163 04/24/2017 0955   TRIG 189 (H) 04/24/2017 0955   HDL 36 (L) 04/24/2017 0955   CHOLHDL 4.5 04/24/2017 0955   CHOLHDL 3 08/19/2013 1254   VLDL 29.6 08/19/2013 1254   LDLCALC 89 04/24/2017 0955   HgbA1c:  Lab Results  Component Value Date   HGBA1C 5.4 05/05/2017   Urine Drug Screen:     Component Value Date/Time   LABOPIA NONE DETECTED 05/04/2017 2339   COCAINSCRNUR NONE DETECTED 05/04/2017 2339   LABBENZ NONE DETECTED 05/04/2017 2339   AMPHETMU NONE DETECTED 05/04/2017 2339   THCU NONE DETECTED 05/04/2017 2339   LABBARB NONE  DETECTED 05/04/2017 2339    Alcohol Level     Component Value Date/Time   ETH <5 05/04/2017 1934    IMAGING  Ct Head Wo Contrast 05/04/2017 1. No acute intracranial abnormalities.  2. Moderate cerebral and mild cerebellar atrophy with extensive chronic microvascular ischemic changes in the cerebral white matter redemonstrated, as above.    Mr Brain Wo Contrast 05/04/2017 1. Subtle punctate 5 mm focus of diffusion abnormality at the dorsal aspect of the pons/midbrain at the floor of the fourth ventricle, suspicious for possible tiny acute ischemic small vessel type infarct. No associated hemorrhage.  2. No other acute intracranial process.  3. Moderate cerebral atrophy with chronic small vessel ischemic disease with remote lacunar infarcts involving the left basal ganglia, bilateral thalami, and bilateral cerebellar hemispheres.    Mr Jodene Nam Head Wo Contrast Mr Jodene Nam Neck Wo Contrast 05/05/2017 1. No vertebral artery or significant basilar artery stenosis. Proximal Basilar artery atherosclerosis has increased since 2012.  2. Chronic right PCA atherosclerosis has progressed and is now severe, although the vessel remains patent.  3. Increased proximal left ICA atherosclerosis in the neck since 2012, now with stenosis estimated at 60%.  4. Increased bilateral MCA atherosclerosis since 2012, with new mild stenosis at both distal M1 segments.    PHYSICAL  EXAM Pleasant frail elderly Caucasian male not in distress. . Afebrile. Head is nontraumatic. Neck is supple without bruit.    Cardiac exam no murmur or gallop. Lungs are clear to auscultation. Distal pulses are well felt.  Neurological Exam ;  Awake  Alert oriented x 3. Normal speech and language. Biateral intermittent nuclear ophthalmoplegia with absent adduction of both eyes with nystagmus on lateral gaze with overc. No restriction of vertical eye movements No ptosis. Pupils are equal reactive..fundi were not visualized. Vision acuity and  fields appear normal. Hearing is normal. Palatal movements are normal. Face symmetric. Tongue midline. Normal strength, tone, reflexes and coordination. Normal sensation. Gait deferred.   ASSESSMENT/PLAN Marc [name] is a 83 y.o. male with history of previous strokes, hypertension, ongoing tobacco use, hyperlipidemia, history of C. difficile, coronary artery disease, chronic kidney disease, and AAA presenting with diplopia with disconjugate eye movements. He did not receive IV t-PA due to late presentation.  Stroke:  5 mm focus of diffusion abnormality at the dorsal aspect of the pons/midbrain. Likely small vessel disease.  Resultant  Bilateral internal nuclear ophthalmoplegia    CT head - No acute intracranial abnormalities.   MRI Head -  5 mm focus of diffusion abnormality at the dorsal aspect of the pons/midbrain. Remote infarcts.  MRA Head & Neck - 60% stenosis left ICA. Severe right PCA stenosis.  Carotid Doppler - cancel - MRA neck  2D Echo - 04/23/2017 - no cardiac source of emboli identified.  LDL - 89  HgbA1c pending  VTE prophylaxis - SCDs Diet Heart Room service appropriate? Yes; Fluid consistency: Thin Diet - low sodium heart healthy  aspirin 81 mg daily prior to admission, now on aspirin 325 mg daily Change to Plavix  Patient counseled to be compliant with his antithrombotic medications  Ongoing aggressive stroke risk factor management  Therapy recommendations: pending   Disposition: Pending  Hypertension  Stable  Permissive hypertension (OK if < 220/120) but gradually normalize in 5-7 days  Long-term BP goal normotensive  Hyperlipidemia  Home meds:  No lipid lowering medications prior to admission  LDL 89, goal < 70  Now on Crestor 20 mg daily  Continue statin at discharge   Other Stroke Risk Factors  Advanced age  Cigarette smoker - advised to stop smoking  Hx stroke/TIA  Family hx stroke (mother)  Coronary artery  disease   Other Active Problems  Chronic kidney disease - 10 / 1.78  60% left internal carotid artery stenosis - will need to be followed.  Hospital day # 1   He presented with diplopia do you to bilateral internuclear ophthalmoplegia from tiny brainstem infarct likely from small vessel disease. Continue Plavix for stroke prevention  discharge home today with home therapy. Follow-up as an outpatient in stroke clinic in 6 weeks. Kindly call for questions. Stroke team will sign off.   Antony Contras, MD Medical Director [facility] Pager: [phone] 05/06/2017 12:53 PM    To contact Stroke Continuity provider, please refer to [email or web address] After hours, contact General Neurology

## 2017-05-06 NOTE — Progress Notes (Signed)
  Date: 05/06/2017  Patient name: Marc [name]  Medical record number: [id number]  Date of birth: 04-17-1930   I have seen and evaluated this patient and I have discussed the plan of care with the house staff. Please see Dr. Tanna Furry note for complete details. I concur with his findings.  Patient appears ready to be discharged home today.  Appropriate medications are on board.  He reports ability to get these medications at home including plavix and rosuvastatin which will be new.  He should have neurology and PCP follow up in the near future.  Awaiting final neurology recommendations prior to discharge.   Sid Falcon, MD 05/06/2017, 9:45 AM

## 2017-05-06 NOTE — Progress Notes (Signed)
Pt being discharged from hospital per orders from MD. Pt and son educated on discharge instructions. Pt and son verbalized understanding of discharge instructions. All questions and concerns were addressed. Pt's IV was removed prior to discharge. Pt exited hospital via wheelchair.

## 2017-05-06 NOTE — Progress Notes (Signed)
   Subjective:  Patient feeling well this morning. He has an eye patch over his right eye and this is working well for him. He is not having blurry vision from his left eye. He says he got good sleep last night.  He wanted to know when he was going home home. He had a few questions regarding his medications and these were answered to his satisfaction.  Objective:  Vital signs in last 24 hours: Vitals:   05/05/17 2116 05/06/17 0114 05/06/17 0300 05/06/17 0600  BP: (!) 178/74 (!) 156/77 (!) 172/71 (!) 168/74  Pulse: (!) 51 (!) 52 (!) 55 (!) 52  Resp: 16  18 16   Temp: 98.1 F (36.7 C) 98 F (36.7 C)  98.1 F (36.7 C)  TempSrc: Oral Oral  Oral  SpO2: 100% 100% 98% 99%  Weight:      Height:       Physical Exam  Constitutional: He is oriented to person, place, and time. He appears well-developed.  Eye patch on right eye  HENT:  Head: Normocephalic and atraumatic.  Eyes:  Extraocular movements limited with adduction  Cardiovascular: Normal rate and regular rhythm.  Exam reveals no friction rub.   No murmur heard. Respiratory: Effort normal and breath sounds normal. No respiratory distress. He has no wheezes.  Breath sounds distant   GI: Soft. Bowel sounds are normal. He exhibits no distension. There is no tenderness.  Bowel Sounds positive  Musculoskeletal: He exhibits no edema.  Neurological: He is alert and oriented to person, place, and time.     Assessment/Plan:  Active Problems:   CVA (cerebral vascular accident) ([place])  # Internuclear ophthalmoplegia/brainstem infarct.  A CT scan was negative for any hemorrhage, MRI shows a small pons/midbrain infarct, most likely the explanation for his symptoms. Pt was on aspirin 81 mg daily for secondary prevention. Will switch to Plavix. Physical therapy recs home health PT. Will also start statin as patient was not previously on statin therapy at time of admission.  -- Plavix 75 mg -- Rosuvastatin 20 mg daily -- Stroke workup- CT  head negative, MRI brain 5 mm punctate infarct at the dorsal aspect of the pons, MRA head and neck- Chronic right PCA atherosclerosis  which is severe and increased proximal left ICA atherosclerosis with 60% stenosis -- PT recs home health physical therapy   # Hypertension. -- Permissive hypertension -- Can resume BP medications in 48 more hours.  # CKD -- Continue to monitor  # CAD. No active chest pain. EKG without any acute change and troponin negative.  # AAA.  He has an history of stent and graft repair approximately 5 years ago, on recent duplex ultrasound follow-up and found to have some enlargement of his aneurysm sac-Recently underwent Extension of right iliac stent graft for expansion of aneurysm sac. According to daughter he was doing well since his surgery.  CODE STATUS. Full VT prophylaxis. SCDs Diet. Heart healthy  Dispo: Anticipated discharge today.   Ophelia Shoulder, MD 05/06/2017, 7:24 AM Pager: [phone]

## 2017-05-06 NOTE — Progress Notes (Signed)
Patients son up to the unit and asking that his sister [name] be called about patient discharging home. CM called [name] and she is home with her mother and concerned about her mother being confused and her father coming home. CM informed her that MD feels he is medically ready for d/c. CM offered to send a private duty list and she refused.  CM went over that her father picked [facility]. Daughter asking to have it changed to [facility]. CM informed Santiago Glad of change and called [facility]. CM faxed them the information requested. Daughter also requesting [place] aide. CM spoke to MD and SW also added to assist daughter with needs  of Mother and patient.  Son to provide transportation home.

## 2017-05-06 NOTE — Progress Notes (Signed)
Occupational Therapy Evaluation Patient Details Name: Marc [name] MRN: [id number] DOB: [birth year] Today's Date: 05/06/2017    History of Present Illness Patient admitted 05/04/17 with difficulty ambulating and double vision and was found to have intranuclear ophthalmoplegia on exam and a small pontine/midbrain infarct on his MRI which is the most likely cause for his symptoms per MD notes.,     PMH:  previous strokes, hypertension, ongoing tobacco use, hyperlipidemia, history of C. difficile, coronary artery disease, chronic kidney disease, and AAA.     Clinical Impression   PTA, pt independent with ADL and mobility. Pt presents with complaints of diplopia due to apparent INO per MD. Vision deficits affected mobility and ADL. Pt will need to alternate eye patch q 2 hrs and follow up with [employer] [facility] [employer]. Pt may need to follow up with outpt OT after [place]. In process of ordering patch for pt. Will return to complete education on use of eye patch and discuss home safety given high risk for falls.     Follow Up Recommendations  Supervision/Assistance - 24 hour;Home health OT    Equipment Recommendations  None recommended by OT    Recommendations for Other Services       Precautions / Restrictions Precautions Precautions: Fall Precaution Comments: vision deficits Restrictions Weight Bearing Restricti: No      Mobility Bed Mobility Overal bed mobility: Needs Assistance Bed Mobility: Supine to Sit;Sit to Supine     Supine to sit: Min guard Sit to supine: Min guard   General bed mobility comments: Assist for safety  Transfers Overall transfer level: Needs assistance Equipment used: Rolling walker (2 wheeled) Transfers: Sit to/from Stand Sit to Stand: Min guard         General transfer comment: Verbal cues for hand placement.  Assist for safety.    Balance Overall balance assessment: Needs assistance Sitting-balance support: No upper extremity supported Sitting balance-Leahy Scale:  Good     Standing balance support: Bilateral upper extremity supported Standing balance-Leahy Scale: Poor                             ADL either performed or assessed with clinical judgement   ADL Overall ADL's : Needs assistance/impaired Eating/Feeding: Set up;Sitting   Grooming: Set up;Standing;Supervision/safety   Upper Body Bathing: Set up;Supervision/ safety;Sitting   Lower Body Bathing: Min guard;Sit to/from stand   Upper Body Dressing : Set up;Supervision/safety;Sitting   Lower Body Dressing: Min guard;Sit to/from stand   Toilet Transfer: Minimal assistance;RW;Ambulation;BSC   Toileting- Water quality scientist and Hygiene: Supervision/safety;Sit to/from stand       Functional mobility during ADLs: Min guard;Rolling walker;Cueing for safety General ADL Comments: vision deficits affecting mobility/safety with ADL     Vision Baseline Vision/History: Wears glasses Wears Glasses: At all times Patient Visual Report: Diplopia;Blurring of vision Vision Assessment?: Yes Eye Alignment: Impaired (comment) Ocular Range of Motion: Restricted on the right;Restricted on the left;Impaired-to be further tested in functional context Alignment/Gaze Preference: Other (comment) Tracking/Visual Pursuits: Right eye does not track medially;Left eye does not track medially;Decreased smoothness of horizontal tracking;Impaired - to be further tested in functional context Saccades: Additional head turns occurred during testing Convergence: Impaired (comment) Visual Fields: No apparent deficits Diplopia Assessment: Disappears with one eye closed;Objects split side to side Depth Perception: Undershoots Additional Comments: INO per chart     Perception     Praxis      Pertinent Vitals/Pain Pain Assessment: No/denies pain  Hand Dominance Right   Extremity/Trunk Assessment Upper Extremity Assessment Upper Extremity Assessment: Overall WFL for tasks assessed   Lower  Extremity Assessment Lower Extremity Assessment: Overall WFL for tasks assessed   Cervical / Trunk Assessment Cervical / Trunk Assessment: Normal   Communication Communication Communication: HOH   Cognition Arousal/Alertness: Awake/alert Behavior During Therapy: WFL for tasks assessed/performed;Flat affect Overall Cognitive Status: Within Functional Limits for tasks assessed                                     General Comments       Exercises     Shoulder Instructions      Home Living Family/patient expects to be discharged to:: Private residence Living Arrangements: Spouse/significant other Available Help at Discharge: Family;Available 24 hours/day (Wife, daughter, son) Type of Home: House Home Access: Ramped entrance     Independence: One level     Bathroom Shower/Tub: Teacher, early years/pre: Handicapped height Bathroom Accessibility: Yes How Accessible: Accessible via walker Home Equipment: [place] - 2 wheels;Shower seat;Bedside commode;Wheelchair - manual          Prior Functioning/Environment Level of Independence: Independent with assistive device(s)        Comments: Reports he occasionally used RW..  Does not drive.  Daughter and grandson helps with transportation.        OT Problem List: Impaired balance (sitting and/or standing);Impaired vision/perception;Decreased safety awareness      OT Treatment/Interventions: Self-care/ADL training;Therapeutic exercise;DME and/or AE instruction;Therapeutic activities;Visual/perceptual remediation/compensation;Patient/family education;Balance training    OT Goals(Current goals can be found in the care plan section) Acute Rehab OT Goals Patient Stated Goal: To go home OT Goal Formulation: With patient Time For Goal Achievement: 05/20/17 Potential to Achieve Goals: Good  OT Frequency: Min 3X/week   Barriers to D/C:            Co-evaluation              AM-PAC PT "6  Clicks" Daily Activity     Outcome Measure Help from another person eating meals?: A Little Help from another person taking care of personal grooming?: A Little Help from another person toileting, which includes using toliet, bedpan, or urinal?: A Little Help from another person bathing (including washing, rinsing, drying)?: A Little Help from another person to put on and taking off regular upper body clothing?: None Help from another person to put on and taking off regular lower body clothing?: A Little 6 Click Score: 19   End of Session Nurse Communication: Other (comment) (need for eye patch/alternate q 2 hrs)  Activity Tolerance: Patient tolerated treatment well Patient left: in chair;with call bell/phone within reach  OT Visit Diagnosis: Unsteadiness on feet (R26.81);Other abnormalities of gait and mobility (R26.89);Low vision, both eyes (H54.2)                Time: 0630-1601 OT Time Calculation (min): 24 min Charges:  OT General Charges $OT Visit: 1 Procedure OT Evaluation $OT Eval Moderate Complexity: 1 Procedure OT Treatments $Self Care/Home Management : 8-22 mins G-Codes:     [facility], OT/L  [phone] 05/06/2017  Elaysha Bevard,HILLARY 05/06/2017, 11:28 AM

## 2017-05-06 NOTE — Discharge Summary (Signed)
Name: Marc [name] MRN: [id number] DOB: [birth year] 83 y.o. PCP: Leonard Downing, MD  Date of Admission: 05/04/2017  6:22 PM Date of Discharge: 05/06/2017 Attending Physician: Sid Falcon, MD  Discharge Diagnosis: 1. CVA- 5 mm focus of diffusion abnormality at the dorsal aspect of the pons/midbrain 2. Hypertension 3. Chronic medical conditions Active Problems:   CVA (cerebral vascular accident) ([place])   Diplopia   INO (internuclear ophthalmoplegia), bilateral   Discharge Medications: Allergies as of 05/06/2017      Reactions   No Known Allergies       Medication List    STOP taking these medications   aspirin EC 81 MG tablet   oxyCODONE-acetaminophen 5-325 MG tablet Commonly known as:  PERCOCET/ROXICET     TAKE these medications   acetaminophen 500 MG tablet Commonly known as:  TYLENOL Take 1,000 mg by mouth every 8 (eight) hours as needed for mild pain or headache.   allopurinol 100 MG tablet Commonly known as:  ZYLOPRIM TAKE 200 mg BY MOUTH DAILY AFTER MEALS FOR GOUT   B-12 PO Take 1 tablet by mouth daily.   cholecalciferol 1000 units tablet Commonly known as:  VITAMIN D Take 1,000 Units by mouth daily.   clopidogrel 75 MG tablet Commonly known as:  PLAVIX Take 1 tablet (75 mg total) by mouth daily. Start taking on:  05/07/2017   escitalopram 20 MG tablet Commonly known as:  LEXAPRO Take 20 mg by mouth at bedtime.   gabapentin 100 MG capsule Commonly known as:  NEURONTIN Take 100 mg by mouth at bedtime.   guaiFENesin 600 MG 12 hr tablet Commonly known as:  MUCINEX Take 600 mg by mouth daily as needed for cough or to loosen phlegm.   metoprolol tartrate 100 MG tablet Commonly known as:  LOPRESSOR TAKE 200 MG BY MOUTH EVERY DAY FOR HYPERTENSION   multivitamin with minerals Tabs tablet Take 1 tablet by mouth daily.   ranitidine 150 MG capsule Commonly known as:  ZANTAC Take 150 mg by mouth at bedtime.   rosuvastatin 20 MG  tablet Commonly known as:  CRESTOR Take 1 tablet (20 mg total) by mouth daily at 6 PM.   sodium bicarbonate 650 MG tablet Take 650 mg by mouth 2 (two) times daily.   tamsulosin 0.4 MG Caps capsule Commonly known as:  FLOMAX Take 0.4 mg by mouth at bedtime.       Disposition and follow-up:   [name] was discharged from [facility] in Good condition.  At the hospital follow up visit please address:  1.  Please ensure the patient has restarted his blood pressure medications. Please follow-up on his increased left ICA stenosis as he will likely need this followed in the outpatient setting.   2.  Labs / imaging needed at time of follow-up: None  3.  Pending labs/ test needing follow-up: Hemoglobin A1c  Follow-up Appointments: 1. Patient will need follow-up with his PCP and his neurologist.  Hospital Course by problem list: Active Problems:   CVA (cerebral vascular accident) ([place])   Diplopia   INO (internuclear ophthalmoplegia), bilateral   1. CVA The patient presented to the [facility] emergency department on 05/04/2017 with a chief complaint of diplopia. Stat CT head without contrast was negative. Neurological examination showed difficulty with abduction of the eyes bilaterally consistent with intranuclear ophthalmoplegia. The patient was not a TPA candidate given time since last known normal. MRI brain showed small punctate infarct at the dorsal  aspect of the pons. Remaining stroke workup showed significant atherosclerosis disease. The patient was on aspirin 81 mg the time of presentation. He will be switched to clopidogrel 75 mg daily and started on rosuvastatin 20 mg daily. At the time of discharge the patient was wearing an eye patch over the right eye and stated that his vision was not blurry from his left eye. He was slightly hypertensive but this was in the setting of permissive hypertension. He'll be discharged home.  2. Hypertension Patient is a  history of hypertension. His antihypertensive medications were held in the inpatient setting to allow for permissive hypertension. These will need to be restarted approximately 48 hours after discharge. At his follow-up visit please ensure he has restarted his blood pressure medications.  3. Chronic medical conditions No other changes were made to his chronic medical conditions except for as stated above under point #1 and under point #2.  Discharge Vitals:   BP (!) 177/64 (BP Location: Left Arm)   Pulse (!) 51   Temp 98.2 F (36.8 C) (Oral)   Resp (!) 6   Ht 5' 7.25" (1.708 m)   Wt 152 lb 3.2 oz (69 kg)   SpO2 98%   BMI 23.66 kg/m   Pertinent Labs, Studies, and Procedures:  1. CT head without contrast- No acute intracranial abnormality  2. MRI brain- Subtle punctate 5 mm focus of diffusion abnormality at the dorsal aspect of the pons 3. MRA head and neck- no vertebral artery or significant basilar artery stenosis, chronic right PCA atherosclerosis has progressed and is now severe, increased proximal left ICA atherosclerosis without stenosis estimated at 60%.  ** For full details imaging report please see Epic or request the official documentation**  Discharge Instructions: Discharge Instructions    Diet - low sodium heart healthy    Complete by:  As directed    Discharge instructions    Complete by:  As directed    You have had a small stroke.  We have made some changes to your medications. Please stop the aspirin. In place of aspirin we have started Plavix. Please take this medicine daily to help prevent another stroke. We have also started a medication called Crestor. This medication will help lower your cholesterol. Please take this medication every day. It will also help prevent another stroke.  Please wait two more days after discharge before you start the metoprolol back. This will give your brain more time to heal. After two days please start this medication back.  Please  follow-up and make appointments with your neurologist and with your primary care physician.   I hope you continue to recover and we wish you the best in the future!      Signed: Ophelia Shoulder, MD 05/06/2017, 12:21 PM   Pager: [phone]

## 2017-05-06 NOTE — Care Management Note (Signed)
Case Management Note  Patient Details  Name: Marc [name] MRN: [id number] Date of Birth: 1930-06-29  Subjective/Objective:   Pt admitted with CVA. He is from home with his wife. Per patient he has DME at home: walker, wheelchair, shower chair.                 Action/Plan: Pt discharging home with orders for [facility] services. CM provided him a list of [place] agencies. He selected [place]. Santiago Glad with [facility] notified and accepted the referral. Patient asking that his daughter be called to set up the [facility]. Santiago Glad provided with her number: [phone]. Family to provide transportation home.   Expected Discharge Date:  05/06/17               Expected Discharge Plan:  [place]  In-House Referral:     Discharge planning Services  CM Consult  Post Acute Care Choice:  Home Health Choice offered to:     DME Arranged:    DME Agency:     HH Arranged:  PT, OT HH Agency:  [place]  Status of Service:  Completed, signed off  If discussed at [place] of Stay Meetings, dates discussed:    Additional Comments:  Pollie Friar, RN 05/06/2017, 1:02 PM

## 2017-06-07 ENCOUNTER — Telehealth: Payer: Self-pay | Admitting: Vascular Surgery

## 2017-06-07 NOTE — Telephone Encounter (Signed)
Sched appts 11/05/17. CTA 12:20 at [place]. MD at 1:30. Mailed CT packet.

## 2017-06-07 NOTE — Telephone Encounter (Signed)
-----   Message from Mena Goes, RN sent at 04/29/2017  9:53 AM EDT ----- Regarding: cancel previous message and make appt in 6 months   ----- Message ----- From: Alvia Grove, PA-C Sent: 04/29/2017   9:49 AM To: Vvs Charge Pool  Please disregard message on 04/27/17 and cancel 4 week appt and CT  Patient s/p extension of right iliac stent graft 04/26/17   Needs non contrast CT abd/pelvis and office visit with Dr. Donnetta Hutching in 6 months.   Thanks Maudie Mercury

## 2017-10-08 ENCOUNTER — Ambulatory Visit: Payer: Medicare Other | Admitting: Vascular Surgery

## 2017-10-08 ENCOUNTER — Other Ambulatory Visit (HOSPITAL_COMMUNITY): Payer: Medicare Other

## 2017-11-05 ENCOUNTER — Inpatient Hospital Stay: Admission: RE | Admit: 2017-11-05 | Payer: Medicare Other | Source: Ambulatory Visit

## 2017-11-05 ENCOUNTER — Telehealth: Payer: Self-pay | Admitting: *Deleted

## 2017-11-05 ENCOUNTER — Ambulatory Visit: Payer: Medicare Other | Admitting: Vascular Surgery

## 2017-11-05 NOTE — Telephone Encounter (Signed)
Call from patient's family [name]) upset that patient's 6 month  follow up appointment has been pushed back to January.(12/10 due to weather) He is to have CT and then see Dr. Donnetta Hutching and they do  want to keep this as  To be done  on same day. She claims he is not having any problems, such as infection, decrease temp or color. Offered her an appointment to be seen by NP if any problems. Go to the ED for any acute issues. On call if any cancellations.

## 2017-12-24 ENCOUNTER — Encounter: Payer: Self-pay | Admitting: Vascular Surgery

## 2017-12-24 ENCOUNTER — Ambulatory Visit (INDEPENDENT_AMBULATORY_CARE_PROVIDER_SITE_OTHER): Payer: Medicare Other | Admitting: Vascular Surgery

## 2017-12-24 ENCOUNTER — Other Ambulatory Visit: Payer: Self-pay

## 2017-12-24 ENCOUNTER — Ambulatory Visit
Admission: RE | Admit: 2017-12-24 | Discharge: 2017-12-24 | Disposition: A | Discharge status: Home / Self Care | Payer: Medicare Other | Source: Ambulatory Visit | Attending: Vascular Surgery | Admitting: Vascular Surgery

## 2017-12-24 VITALS — BP 165/89 | HR 56 | Temp 97.1°F | Resp 20 | Ht 67.0 in | Wt 154.0 lb

## 2017-12-24 DIAGNOSIS — I714 Abdominal aortic aneurysm, without rupture, unspecified: Secondary | ICD-10-CM

## 2017-12-24 DIAGNOSIS — Z95828 Presence of other vascular implants and grafts: Secondary | ICD-10-CM

## 2017-12-24 MED ORDER — IOPAMIDOL (ISOVUE-370) INJECTION 76%
75.0000 mL | Freq: Once | INTRAVENOUS | Status: AC | PRN
  Administered 2017-12-24: 75 mL via INTRAVENOUS

## 2017-12-24 NOTE — Progress Notes (Signed)
[facility] [place]  Patient name: Marc [name] MRN: [id number] DOB: Oct 24, 1930 Sex: male  REASON FOR VISIT: Follow up stent graft repair abdominal aortic aneurysm  HPI: Marc [name] is a 84 y.o. male here today for follow-up.  He is with his families.  He had a remote history of stent graft repair of abdominal aortic aneurysm.  CT follow-up showed expansion of his aneurysm sac and probable type I the endoleak at the right iliac artery.  June 2018 he underwent attention and is here for follow-up.  He does have known renal insufficiency and therefore had a Noncon he continues to be quite frail but is able to do his activities of daily living with the help of his family  Past Medical History:  Diagnosis Date  . AAA (abdominal aortic aneurysm) ([place])   . AAA (abdominal aortic aneurysm) ([place])   . Arthritis   . CKD (chronic kidney disease)    CKD III-IV (Dr. Posey Pronto, [facility])  . Coronary artery disease    s/p CABG Dr. Servando Snare, Dr. Acie Fredrickson  . Falls frequently   . Gall stones    patiet unsure  . GERD (gastroesophageal reflux disease)   . Gout   . History of Clostridium difficile infection   . History of kidney stones   . History of ulcerative colitis   . Hyperlipidemia   . Hypertension   . Kidney stone on left side    has not passed as of 09/23/12  . Stroke [facility]) 2012   affecting RUE    Family History  Problem Relation Age of Onset  . Stroke Mother   . Hypertension Mother   . Heart disease Sister        Aneurysm of the Brain    SOCIAL HISTORY: Social History   Tobacco Use  . Smoking status: Current Some Day Smoker    Packs/day: 1.00    Years: 68.00    Pack years: 68.00    Types: Cigarettes  . Smokeless tobacco: Never Used  . Tobacco comment: has 1-3 a week  Substance Use Topics  . Alcohol use: No    Alcohol/week: 0.0 oz    Allergies  Allergen Reactions  . No Known Allergies     Current  Outpatient Medications  Medication Sig Dispense Refill  . acetaminophen (TYLENOL) 500 MG tablet Take 1,000 mg by mouth every 8 (eight) hours as needed for mild pain or headache.     . allopurinol (ZYLOPRIM) 100 MG tablet TAKE 200 mg BY MOUTH DAILY AFTER MEALS FOR GOUT  3  . cholecalciferol (VITAMIN D) 1000 units tablet Take 1,000 Units by mouth daily.    . clopidogrel (PLAVIX) 75 MG tablet Take 1 tablet (75 mg total) by mouth daily. 30 tablet 1  . Cyanocobalamin (B-12 PO) Take 1 tablet by mouth daily.    Marland Kitchen escitalopram (LEXAPRO) 20 MG tablet Take 20 mg by mouth at bedtime.     . gabapentin (NEURONTIN) 100 MG capsule Take 100 mg by mouth at bedtime.     Marland Kitchen guaiFENesin (MUCINEX) 600 MG 12 hr tablet Take 600 mg by mouth daily as needed for cough or to loosen phlegm.     . metoprolol (LOPRESSOR) 100 MG tablet TAKE 200 MG BY MOUTH EVERY DAY FOR HYPERTENSION  7  . Multiple Vitamin (MULTIVITAMIN WITH MINERALS) TABS Take 1 tablet by mouth daily.    . ranitidine (ZANTAC) 150 MG capsule Take 150 mg by mouth at bedtime.     Marland Kitchen  rosuvastatin (CRESTOR) 20 MG tablet Take 1 tablet (20 mg total) by mouth daily at 6 PM. 30 tablet 1  . sodium bicarbonate 650 MG tablet Take 650 mg by mouth 2 (two) times daily.     . tamsulosin (FLOMAX) 0.4 MG CAPS capsule Take 0.4 mg by mouth at bedtime.  7   No current facility-administered medications for this visit.     REVIEW OF SYSTEMS:  [X]  denotes positive finding, [ ]  denotes negative finding Cardiac  Comments:  Chest pain or chest pressure:    Shortness of breath upon exertion:    Short of breath when lying flat:    Irregular heart rhythm:        Vascular    Pain in calf, thigh, or hip brought on by ambulation:    Pain in feet at night that wakes you up from your sleep:     Blood clot in your veins:    Leg swelling:           PHYSICAL EXAM: Vitals:   12/24/17 1534  BP: (!) 165/89  Pulse: (!) 56  Resp: 20  Temp: (!) 97.1 F (36.2 C)  TempSrc: Oral    SpO2: 90%  Weight: 154 lb (69.9 kg)  Height: 5\' 7"  (1.702 m)    GENERAL: The patient is a well-nourished male, in no acute distress. The vital signs are documented above. CARDIOVASCULAR: Palpable femoral pulses.  Absent distal pulses PULMONARY: There is good air exchange  MUSCULOSKELETAL: There are no major deformities or cyanosis. NEUROLOGIC: No focal weakness or paresthesias are detected. SKIN: Does have a callus on the plantar aspect of his fifth metatarsal head and has a bunion over his left great metatarsal head with some superficial ulceration. PSYCHIATRIC: The patient has a normal affect.  DATA:  CT scan today shows no expansion of his aneurysm sac with a maximal diameter of 7 cm and good apposition of his proximal and distal seal zones.  MEDICAL ISSUES: Stable status post extension of right iliac stent.  Will see him again in 1 year with duplex of his stent graft    Rosetta Posner, MD [facility] Tel [phone] Pager [phone]

## 2018-02-18 DIAGNOSIS — Z89412 Acquired absence of left great toe: Secondary | ICD-10-CM | POA: Insufficient documentation

## 2018-03-28 DIAGNOSIS — L84 Corns and callosities: Secondary | ICD-10-CM | POA: Insufficient documentation

## 2018-03-28 DIAGNOSIS — L603 Nail dystrophy: Secondary | ICD-10-CM | POA: Insufficient documentation

## 2018-03-28 DIAGNOSIS — I739 Peripheral vascular disease, unspecified: Secondary | ICD-10-CM | POA: Insufficient documentation

## 2018-04-09 DIAGNOSIS — L03116 Cellulitis of left lower limb: Secondary | ICD-10-CM | POA: Insufficient documentation

## 2018-04-24 DIAGNOSIS — Z789 Other specified health status: Secondary | ICD-10-CM | POA: Insufficient documentation

## 2018-04-24 DIAGNOSIS — Z7409 Other reduced mobility: Secondary | ICD-10-CM | POA: Insufficient documentation

## 2018-05-14 DIAGNOSIS — M2042 Other hammer toe(s) (acquired), left foot: Secondary | ICD-10-CM

## 2018-05-14 DIAGNOSIS — Z89422 Acquired absence of other left toe(s): Secondary | ICD-10-CM | POA: Insufficient documentation

## 2018-05-14 DIAGNOSIS — M2041 Other hammer toe(s) (acquired), right foot: Secondary | ICD-10-CM | POA: Insufficient documentation

## 2018-09-16 IMAGING — CT CT ABD-PELV W/O CM
2 of 4 series · 11 of 36 positions shown, 17 images · non-contrast
Comparison: 11/02/2014

CLINICAL DATA: Endovascular repair of an abdominal aortic aneurysm.

EXAM:
CT ABDOMEN AND PELVIS WITHOUT CONTRAST
TECHNIQUE: Multidetector CT imaging of the abdomen and pelvis was performed
following the standard protocol without IV contrast.

[Series 3: abd/pelvis w/o · axial · non-contrast · 0.70mm/px · z∈[-382,+36]mm · 10 of 193 slices shown, 15 images]
[im 13/193  soft-tissue]
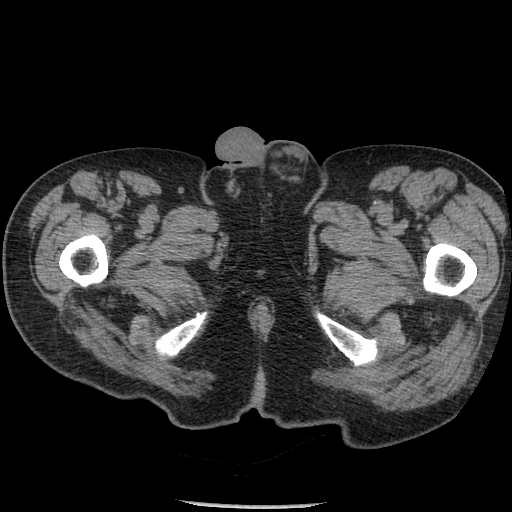
[im 13/193  bone]
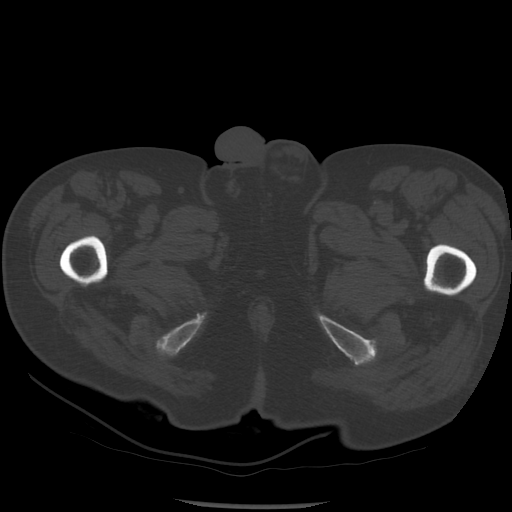
[im 39/193  soft-tissue]
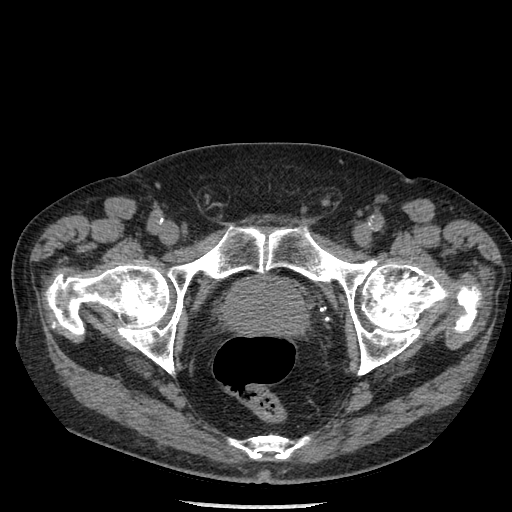
[im 52/193  soft-tissue]
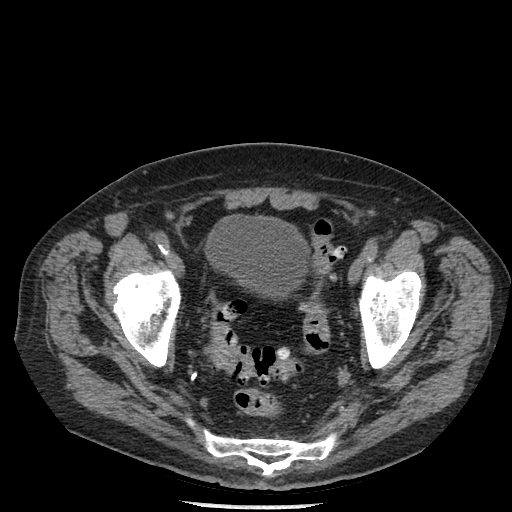
[im 77/193  soft-tissue]
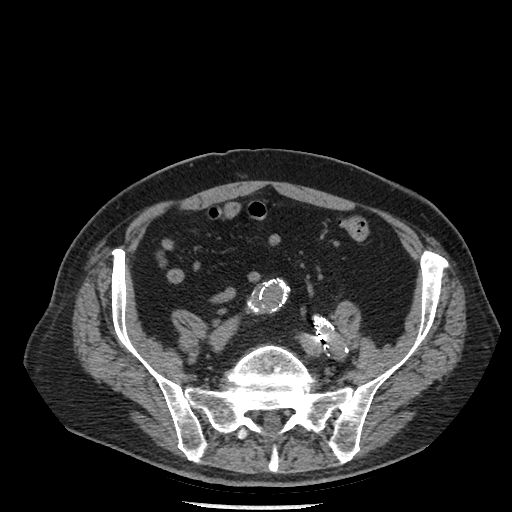
[im 103/193  soft-tissue]
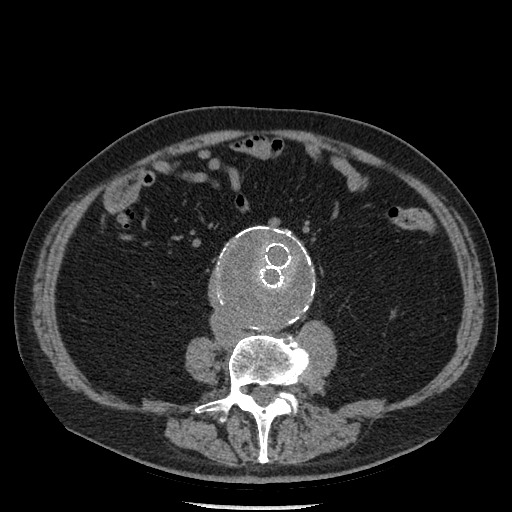
[im 116/193  soft-tissue]
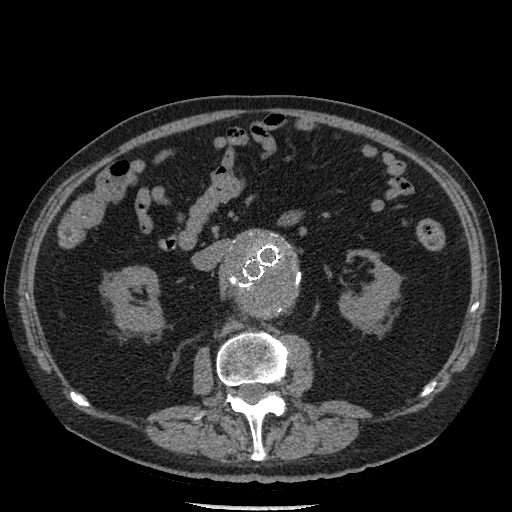
[im 141/193  soft-tissue]
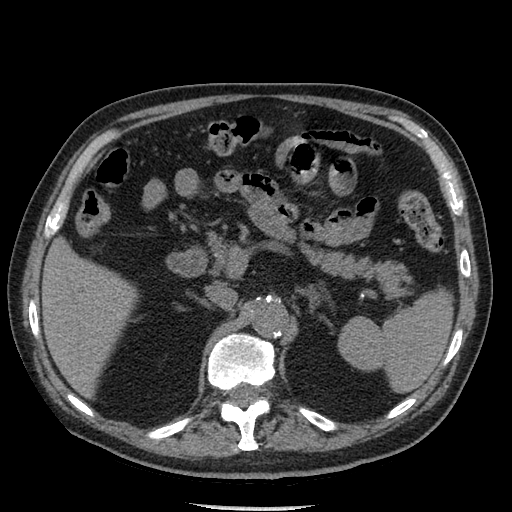
[im 141/193  lung]
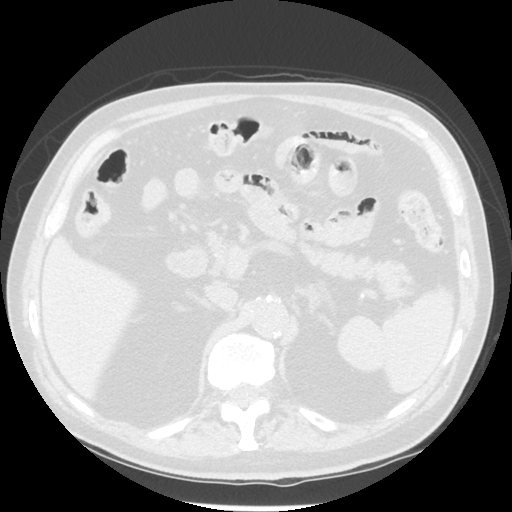
[im 154/193  soft-tissue]
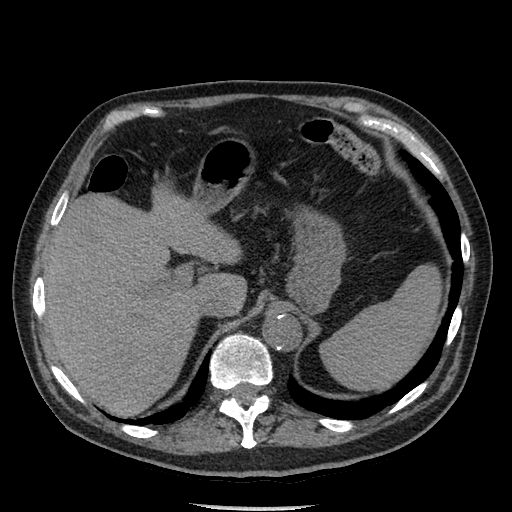
[im 154/193  lung]
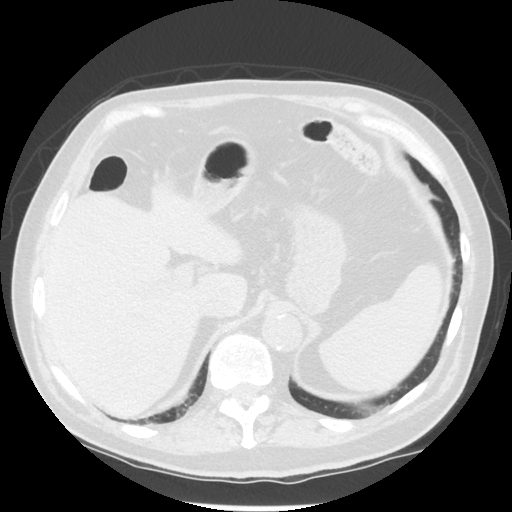
[im 167/193  lung]
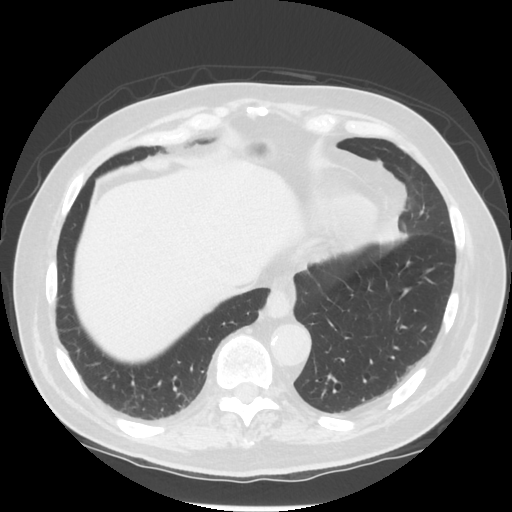
[im 180/193  soft-tissue]
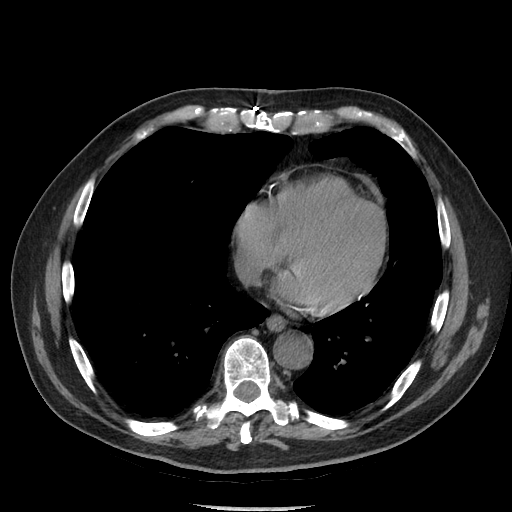
[im 180/193  lung]
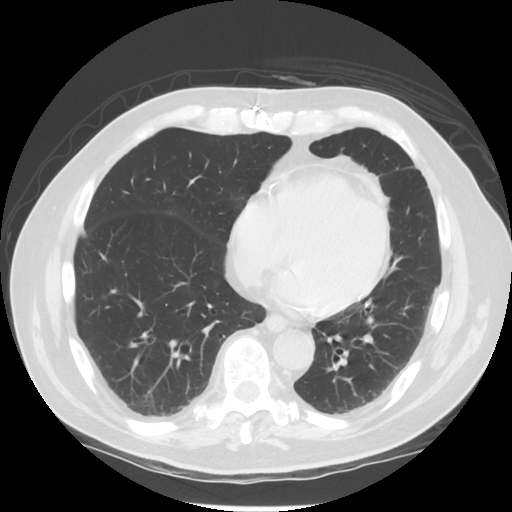
[im 180/193  bone]
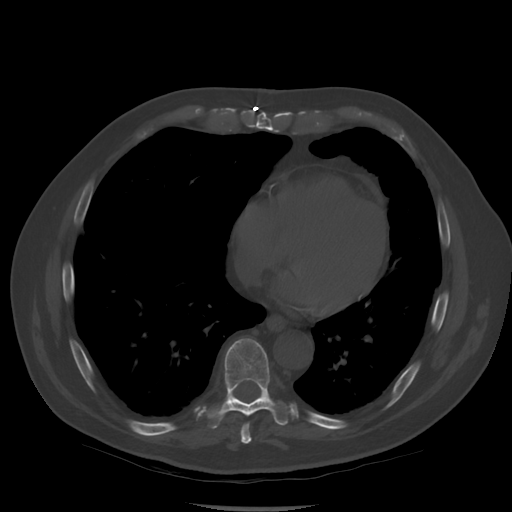

[Series 601: coronal body · coronal · 0.94mm/px · 1 of 119 slices shown, 2 images]
[im 40/119  soft-tissue]
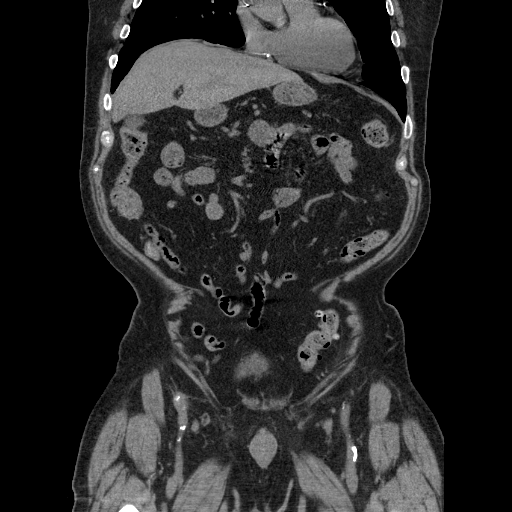
[im 40/119  bone]
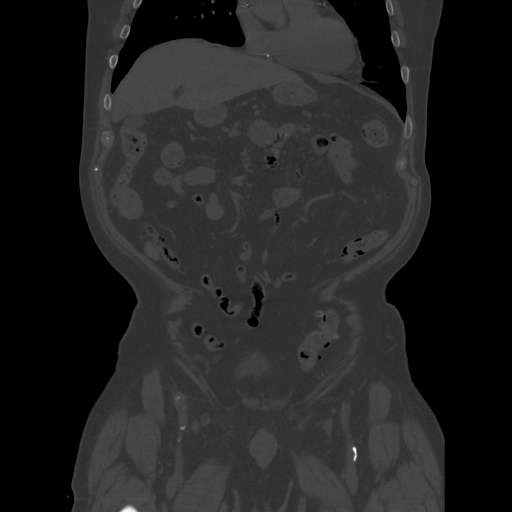

[11 of 36 positions shown; findings below may reference images not displayed]

FINDINGS: Lower chest: 6 mm nodule in the left lower lobe on sequence 4, image
15 has not significantly change since 04/28/2013. No large pleural
effusions. Prior median sternotomy. There is chronic scarring along
the periphery of the right major fissure. Coronary artery
calcifications.

Hepatobiliary: Small density in the gallbladder could represent a
small stone. No significant gallbladder distension. No gallbladder
inflammation. Normal appearance of the liver.

Pancreas: Normal appearance of the pancreas without inflammation or
duct dilatation.

Spleen: Normal appearance of spleen without enlargement.

Adrenals/Urinary Tract: Normal adrenal glands. Two small stones in
the right kidney upper pole without hydronephrosis. 3 mm stone in
the left kidney upper pole. Mild perinephric edema or stranding
appears chronic.

Stomach/Bowel: Appendix has been removed. Cecum is located high in
the right abdomen. Scattered colonic diverticula without acute
colonic inflammation.

Vascular/Lymphatic: Endovascular repair of the abdominal aortic
aneurysm with a stent graft. Stent graft is positioned in the
infrarenal abdominal aorta with limbs extending into the common
iliac arteries bilaterally. The aneurysm sac has markedly increased
in size, now measuring 7.4 x 7.0 cm on sequence 2, image 88 and
previously measured 6.1 x 5.6 cm. In addition, there is a new focal
outpouching along the right posterior aneurysm at the 8 o'clock
position. This may represent a contained rupture or pseudoaneurysm
of the aneurysm sac roughly measuring 2.9 x 2.4 x 4.0 cm on sequence
3, image 88. Again noted is high-density material immediately
surrounding the graft which is similar to the previous examination
but now more conspicuous. In addition, the right common iliac artery
appears to be more aneurysmal measuring up to 2.3 cm on sequence 3,
image 118 and previously measured 2.0 cm. This could be a potential
site for an endoleak. No gross abnormality to the venous structures
on this noncontrast examination. No significant lymph node
enlargement in the abdomen or pelvis.

Reproductive: Prostate is enlarged measuring 6.4 cm in transverse
dimension.

Other: No free fluid. No free air. Postsurgical changes in the right
groin.

Musculoskeletal: Severe disc space loss at L4-L5. Bilateral pars
defects at L5 with mild anterolisthesis at L5-S1.
IMPRESSION: Enlargement of the abdominal aortic aneurysm sac. Findings are
suggestive for an endoleak. Aneurysm sac now measures up to 7.4 cm
and previously measured 6.1 cm. In addition, there is a focal bulge
along the right posterior aspect of the aneurysm sac which is
concerning for a contained rupture or pseudoaneurysm. There is also
interval enlargement of the right common iliac artery near the
distal aspect of the right limb graft. Right common iliac artery
measures up to 2.3 cm. Recommend a CTA of the abdomen and pelvis to
further characterize the abdominal aortic aneurysm and endoleak.

Nonobstructive renal calculi.

Diverticulosis.

These results were called by telephone at the time of interpretation
on 04/02/2017 at [DATE] to Dr. Mayte Dulin, who verbally acknowledged
these results.

## 2019-01-01 ENCOUNTER — Ambulatory Visit: Payer: Medicare Other | Admitting: Podiatry

## 2019-01-08 ENCOUNTER — Ambulatory Visit: Payer: Medicare Other | Admitting: Podiatry

## 2019-01-08 ENCOUNTER — Encounter: Payer: Self-pay | Admitting: Podiatry

## 2019-01-08 VITALS — BP 126/63 | HR 59 | Resp 16

## 2019-01-08 DIAGNOSIS — B351 Tinea unguium: Secondary | ICD-10-CM

## 2019-01-08 DIAGNOSIS — M79676 Pain in unspecified toe(s): Secondary | ICD-10-CM | POA: Diagnosis not present

## 2019-01-08 NOTE — Progress Notes (Signed)
Presents today chief complaint of painful elongated toenails.  Nails are thick yellow and painful he says.  He is also having pain to the fifth metatarsal base.  Objective: Vital signs are stable alert and oriented x3 pulses are nonpalpable but does have venous distention.  Pain on palpation thick mycotic nails.  Assessment: Pain in limb second onychomycosis and vascular disease.  Plan: Recommended lidocaine patches over-the-counter dorsal aspect of the left foot for the pain in the left foot also debrided toenails 1 through 5 bilaterally.

## 2020-03-03 ENCOUNTER — Ambulatory Visit: Payer: Medicare Other

## 2020-12-17 ENCOUNTER — Encounter (HOSPITAL_COMMUNITY): Payer: Self-pay

## 2020-12-17 ENCOUNTER — Other Ambulatory Visit: Payer: Self-pay

## 2020-12-17 ENCOUNTER — Inpatient Hospital Stay (HOSPITAL_COMMUNITY)
Admission: EM | Admit: 2020-12-17 | Discharge: 2020-12-27 | DRG: 177 | Disposition: A | Discharge status: Skilled Nursing Facility | Payer: Medicare Other | Attending: Internal Medicine | Admitting: Internal Medicine

## 2020-12-17 ENCOUNTER — Emergency Department (HOSPITAL_COMMUNITY): Payer: Medicare Other

## 2020-12-17 DIAGNOSIS — I255 Ischemic cardiomyopathy: Secondary | ICD-10-CM | POA: Diagnosis present

## 2020-12-17 DIAGNOSIS — J189 Pneumonia, unspecified organism: Secondary | ICD-10-CM | POA: Diagnosis present

## 2020-12-17 DIAGNOSIS — I2510 Atherosclerotic heart disease of native coronary artery without angina pectoris: Secondary | ICD-10-CM | POA: Diagnosis present

## 2020-12-17 DIAGNOSIS — E785 Hyperlipidemia, unspecified: Secondary | ICD-10-CM | POA: Diagnosis present

## 2020-12-17 DIAGNOSIS — G4733 Obstructive sleep apnea (adult) (pediatric): Secondary | ICD-10-CM | POA: Diagnosis present

## 2020-12-17 DIAGNOSIS — Z7902 Long term (current) use of antithrombotics/antiplatelets: Secondary | ICD-10-CM

## 2020-12-17 DIAGNOSIS — Z515 Encounter for palliative care: Secondary | ICD-10-CM | POA: Diagnosis not present

## 2020-12-17 DIAGNOSIS — I714 Abdominal aortic aneurysm, without rupture, unspecified: Secondary | ICD-10-CM | POA: Diagnosis present

## 2020-12-17 DIAGNOSIS — Z951 Presence of aortocoronary bypass graft: Secondary | ICD-10-CM

## 2020-12-17 DIAGNOSIS — N179 Acute kidney failure, unspecified: Secondary | ICD-10-CM | POA: Diagnosis not present

## 2020-12-17 DIAGNOSIS — I129 Hypertensive chronic kidney disease with stage 1 through stage 4 chronic kidney disease, or unspecified chronic kidney disease: Secondary | ICD-10-CM | POA: Diagnosis present

## 2020-12-17 DIAGNOSIS — R64 Cachexia: Secondary | ICD-10-CM | POA: Diagnosis not present

## 2020-12-17 DIAGNOSIS — R296 Repeated falls: Secondary | ICD-10-CM | POA: Diagnosis present

## 2020-12-17 DIAGNOSIS — N184 Chronic kidney disease, stage 4 (severe): Secondary | ICD-10-CM | POA: Diagnosis not present

## 2020-12-17 DIAGNOSIS — E872 Acidosis: Secondary | ICD-10-CM | POA: Diagnosis not present

## 2020-12-17 DIAGNOSIS — J1282 Pneumonia due to coronavirus disease 2019: Secondary | ICD-10-CM | POA: Diagnosis present

## 2020-12-17 DIAGNOSIS — R001 Bradycardia, unspecified: Secondary | ICD-10-CM | POA: Diagnosis present

## 2020-12-17 DIAGNOSIS — I10 Essential (primary) hypertension: Secondary | ICD-10-CM | POA: Diagnosis present

## 2020-12-17 DIAGNOSIS — J9601 Acute respiratory failure with hypoxia: Secondary | ICD-10-CM | POA: Diagnosis not present

## 2020-12-17 DIAGNOSIS — Z8673 Personal history of transient ischemic attack (TIA), and cerebral infarction without residual deficits: Secondary | ICD-10-CM

## 2020-12-17 DIAGNOSIS — L97519 Non-pressure chronic ulcer of other part of right foot with unspecified severity: Secondary | ICD-10-CM | POA: Diagnosis present

## 2020-12-17 DIAGNOSIS — F17210 Nicotine dependence, cigarettes, uncomplicated: Secondary | ICD-10-CM | POA: Diagnosis present

## 2020-12-17 DIAGNOSIS — Z6825 Body mass index (BMI) 25.0-25.9, adult: Secondary | ICD-10-CM

## 2020-12-17 DIAGNOSIS — G9341 Metabolic encephalopathy: Secondary | ICD-10-CM | POA: Diagnosis not present

## 2020-12-17 DIAGNOSIS — U071 COVID-19: Secondary | ICD-10-CM | POA: Diagnosis not present

## 2020-12-17 DIAGNOSIS — K219 Gastro-esophageal reflux disease without esophagitis: Secondary | ICD-10-CM | POA: Diagnosis present

## 2020-12-17 DIAGNOSIS — Z79899 Other long term (current) drug therapy: Secondary | ICD-10-CM | POA: Diagnosis not present

## 2020-12-17 DIAGNOSIS — K5190 Ulcerative colitis, unspecified, without complications: Secondary | ICD-10-CM | POA: Diagnosis not present

## 2020-12-17 DIAGNOSIS — R609 Edema, unspecified: Secondary | ICD-10-CM | POA: Diagnosis not present

## 2020-12-17 DIAGNOSIS — R9431 Abnormal electrocardiogram [ECG] [EKG]: Secondary | ICD-10-CM | POA: Diagnosis present

## 2020-12-17 DIAGNOSIS — I25810 Atherosclerosis of coronary artery bypass graft(s) without angina pectoris: Secondary | ICD-10-CM | POA: Diagnosis not present

## 2020-12-17 DIAGNOSIS — Z66 Do not resuscitate: Secondary | ICD-10-CM | POA: Diagnosis not present

## 2020-12-17 DIAGNOSIS — M109 Gout, unspecified: Secondary | ICD-10-CM | POA: Diagnosis present

## 2020-12-17 DIAGNOSIS — R7989 Other specified abnormal findings of blood chemistry: Secondary | ICD-10-CM | POA: Diagnosis present

## 2020-12-17 DIAGNOSIS — Z8249 Family history of ischemic heart disease and other diseases of the circulatory system: Secondary | ICD-10-CM

## 2020-12-17 DIAGNOSIS — Z7189 Other specified counseling: Secondary | ICD-10-CM | POA: Diagnosis not present

## 2020-12-17 LAB — BLOOD GAS, ARTERIAL
Acid-base deficit: 1.7 mmol/L (ref 0.0–2.0)
Bicarbonate: 22.1 mmol/L (ref 20.0–28.0)
O2 Saturation: 70.9 %
Patient temperature: 98.6
pCO2 arterial: 35.8 mmHg (ref 32.0–48.0)
pH, Arterial: 7.407 (ref 7.350–7.450)
pO2, Arterial: 39.7 mmHg — CL (ref 83.0–108.0)

## 2020-12-17 LAB — CBC WITH DIFFERENTIAL/PLATELET
Abs Immature Granulocytes: 0.04 10*3/uL (ref 0.00–0.07)
Basophils Absolute: 0 10*3/uL (ref 0.0–0.1)
Basophils Relative: 0 %
Eosinophils Absolute: 0 10*3/uL (ref 0.0–0.5)
Eosinophils Relative: 0 %
HCT: 31.2 % — ABNORMAL LOW (ref 39.0–52.0)
Hemoglobin: 10.4 g/dL — ABNORMAL LOW (ref 13.0–17.0)
Immature Granulocytes: 1 %
Lymphocytes Relative: 10 %
Lymphs Abs: 0.5 10*3/uL — ABNORMAL LOW (ref 0.7–4.0)
MCH: 29.4 pg (ref 26.0–34.0)
MCHC: 33.3 g/dL (ref 30.0–36.0)
MCV: 88.1 fL (ref 80.0–100.0)
Monocytes Absolute: 0.5 10*3/uL (ref 0.1–1.0)
Monocytes Relative: 9 %
Neutro Abs: 4.4 10*3/uL (ref 1.7–7.7)
Neutrophils Relative %: 80 %
Platelets: 155 10*3/uL (ref 150–400)
RBC: 3.54 MIL/uL — ABNORMAL LOW (ref 4.22–5.81)
RDW: 14.3 % (ref 11.5–15.5)
WBC: 5.5 10*3/uL (ref 4.0–10.5)
nRBC: 0 % (ref 0.0–0.2)

## 2020-12-17 LAB — COMPREHENSIVE METABOLIC PANEL
ALT: 38 U/L (ref 0–44)
AST: 97 U/L — ABNORMAL HIGH (ref 15–41)
Albumin: 3.2 g/dL — ABNORMAL LOW (ref 3.5–5.0)
Alkaline Phosphatase: 66 U/L (ref 38–126)
Anion gap: 13 (ref 5–15)
BUN: 46 mg/dL — ABNORMAL HIGH (ref 8–23)
CO2: 20 mmol/L — ABNORMAL LOW (ref 22–32)
Calcium: 8.1 mg/dL — ABNORMAL LOW (ref 8.9–10.3)
Chloride: 103 mmol/L (ref 98–111)
Creatinine, Ser: 2.95 mg/dL — ABNORMAL HIGH (ref 0.61–1.24)
GFR, Estimated: 20 mL/min — ABNORMAL LOW (ref >60)
Glucose, Bld: 125 mg/dL — ABNORMAL HIGH (ref 70–99)
Potassium: 3.9 mmol/L (ref 3.5–5.1)
Sodium: 136 mmol/L (ref 135–145)
Total Bilirubin: 0.4 mg/dL (ref 0.3–1.2)
Total Protein: 6.3 g/dL — ABNORMAL LOW (ref 6.5–8.1)

## 2020-12-17 LAB — TRIGLYCERIDES: Triglycerides: 93 mg/dL (ref <150)

## 2020-12-17 LAB — LACTIC ACID, PLASMA: Lactic Acid, Venous: 1.3 mmol/L (ref 0.5–1.9)

## 2020-12-17 LAB — SARS CORONAVIRUS 2 BY RT PCR (HOSPITAL ORDER, PERFORMED IN ~~LOC~~ HOSPITAL LAB): SARS Coronavirus 2: POSITIVE — AB

## 2020-12-17 LAB — POC SARS CORONAVIRUS 2 AG -  ED: SARS Coronavirus 2 Ag: POSITIVE — AB

## 2020-12-17 LAB — D-DIMER, QUANTITATIVE: D-Dimer, Quant: 2.71 ug/mL-FEU — ABNORMAL HIGH (ref 0.00–0.50)

## 2020-12-17 LAB — FERRITIN: Ferritin: 666 ng/mL — ABNORMAL HIGH (ref 24–336)

## 2020-12-17 LAB — C-REACTIVE PROTEIN: CRP: 13.3 mg/dL — ABNORMAL HIGH (ref <1.0)

## 2020-12-17 LAB — LACTATE DEHYDROGENASE: LDH: 313 U/L — ABNORMAL HIGH (ref 98–192)

## 2020-12-17 LAB — TROPONIN I (HIGH SENSITIVITY): Troponin I (High Sensitivity): 81 ng/L — ABNORMAL HIGH (ref <18)

## 2020-12-17 LAB — PROCALCITONIN: Procalcitonin: 0.47 ng/mL

## 2020-12-17 LAB — FIBRINOGEN: Fibrinogen: 475 mg/dL (ref 210–475)

## 2020-12-17 MED ORDER — ENOXAPARIN SODIUM 30 MG/0.3ML ~~LOC~~ SOLN
30.0000 mg | SUBCUTANEOUS | Status: DC
  Administered 2020-12-17 – 2020-12-20 (×4): 30 mg via SUBCUTANEOUS
  Filled 2020-12-17 (×4): qty 0.3

## 2020-12-17 MED ORDER — VITAMIN D 25 MCG (1000 UNIT) PO TABS
1000.0000 [IU] | ORAL_TABLET | Freq: Every morning | ORAL | Status: DC
  Administered 2020-12-18 – 2020-12-27 (×8): 1000 [IU] via ORAL
  Filled 2020-12-17 (×8): qty 1

## 2020-12-17 MED ORDER — ONDANSETRON HCL 4 MG PO TABS: 4.0000 mg | ORAL_TABLET | Freq: Four times a day (QID) | ORAL | Status: DC | PRN

## 2020-12-17 MED ORDER — BARICITINIB 1 MG PO TABS
1.0000 mg | ORAL_TABLET | Freq: Every day | ORAL | Status: DC
  Administered 2020-12-17 – 2020-12-27 (×9): 1 mg via ORAL
  Filled 2020-12-17 (×11): qty 1

## 2020-12-17 MED ORDER — AMLODIPINE BESYLATE 5 MG PO TABS
5.0000 mg | ORAL_TABLET | Freq: Every morning | ORAL | Status: DC
  Administered 2020-12-18 – 2020-12-27 (×9): 5 mg via ORAL
  Filled 2020-12-17 (×9): qty 1

## 2020-12-17 MED ORDER — TAMSULOSIN HCL 0.4 MG PO CAPS
0.4000 mg | ORAL_CAPSULE | Freq: Every day | ORAL | Status: DC
  Administered 2020-12-17 – 2020-12-26 (×8): 0.4 mg via ORAL
  Filled 2020-12-17 (×9): qty 1

## 2020-12-17 MED ORDER — SODIUM CHLORIDE 0.9 % IV SOLN
200.0000 mg | Freq: Once | INTRAVENOUS | Status: AC
  Administered 2020-12-17: 200 mg via INTRAVENOUS
  Filled 2020-12-17: qty 200

## 2020-12-17 MED ORDER — GABAPENTIN 100 MG PO CAPS
100.0000 mg | ORAL_CAPSULE | Freq: Every day | ORAL | Status: DC
  Administered 2020-12-17 – 2020-12-26 (×8): 100 mg via ORAL
  Filled 2020-12-17 (×9): qty 1

## 2020-12-17 MED ORDER — METOPROLOL TARTRATE 25 MG PO TABS
25.0000 mg | ORAL_TABLET | Freq: Every morning | ORAL | Status: DC
  Administered 2020-12-18: 25 mg via ORAL
  Filled 2020-12-17: qty 1

## 2020-12-17 MED ORDER — ROSUVASTATIN CALCIUM 20 MG PO TABS
20.0000 mg | ORAL_TABLET | Freq: Every morning | ORAL | Status: DC
  Administered 2020-12-18 – 2020-12-27 (×9): 20 mg via ORAL
  Filled 2020-12-17 (×9): qty 1

## 2020-12-17 MED ORDER — ADULT MULTIVITAMIN W/MINERALS CH
1.0000 | ORAL_TABLET | Freq: Every morning | ORAL | Status: DC
  Administered 2020-12-18 – 2020-12-27 (×8): 1 via ORAL
  Filled 2020-12-17 (×8): qty 1

## 2020-12-17 MED ORDER — DEXAMETHASONE SODIUM PHOSPHATE 10 MG/ML IJ SOLN: 6.0000 mg | INTRAMUSCULAR | Status: DC

## 2020-12-17 MED ORDER — VITAMIN B COMPLEX PO TABS: 1.0000 | ORAL_TABLET | Freq: Every morning | ORAL | Status: DC

## 2020-12-17 MED ORDER — DEXAMETHASONE SODIUM PHOSPHATE 10 MG/ML IJ SOLN
10.0000 mg | Freq: Once | INTRAMUSCULAR | Status: AC
  Administered 2020-12-17: 10 mg via INTRAVENOUS
  Filled 2020-12-17: qty 1

## 2020-12-17 MED ORDER — FAMOTIDINE 20 MG PO TABS
20.0000 mg | ORAL_TABLET | Freq: Every morning | ORAL | Status: DC
  Administered 2020-12-18 – 2020-12-27 (×8): 20 mg via ORAL
  Filled 2020-12-17 (×8): qty 1

## 2020-12-17 MED ORDER — SODIUM CHLORIDE 0.9 % IV SOLN
1.0000 g | Freq: Once | INTRAVENOUS | Status: AC
  Administered 2020-12-17: 1 g via INTRAVENOUS
  Filled 2020-12-17: qty 10

## 2020-12-17 MED ORDER — ESCITALOPRAM OXALATE 20 MG PO TABS
20.0000 mg | ORAL_TABLET | Freq: Every day | ORAL | Status: DC
  Administered 2020-12-17 – 2020-12-26 (×8): 20 mg via ORAL
  Filled 2020-12-17 (×8): qty 1
  Filled 2020-12-17: qty 2

## 2020-12-17 MED ORDER — ACETAMINOPHEN 325 MG PO TABS
650.0000 mg | ORAL_TABLET | Freq: Four times a day (QID) | ORAL | Status: DC | PRN
  Administered 2020-12-17: 650 mg via ORAL
  Filled 2020-12-17: qty 2

## 2020-12-17 MED ORDER — CLOPIDOGREL BISULFATE 75 MG PO TABS
75.0000 mg | ORAL_TABLET | Freq: Every morning | ORAL | Status: DC
  Administered 2020-12-18 – 2020-12-27 (×9): 75 mg via ORAL
  Filled 2020-12-17 (×9): qty 1

## 2020-12-17 MED ORDER — SODIUM CHLORIDE 0.9 % IV SOLN
1000.0000 mL | INTRAVENOUS | Status: DC
  Administered 2020-12-17: 1000 mL via INTRAVENOUS

## 2020-12-17 MED ORDER — B COMPLEX-C PO TABS
1.0000 | ORAL_TABLET | Freq: Every day | ORAL | Status: DC
  Administered 2020-12-18 – 2020-12-27 (×8): 1 via ORAL
  Filled 2020-12-17 (×10): qty 1

## 2020-12-17 MED ORDER — SODIUM BICARBONATE 650 MG PO TABS
650.0000 mg | ORAL_TABLET | Freq: Two times a day (BID) | ORAL | Status: DC
  Administered 2020-12-17 – 2020-12-26 (×14): 650 mg via ORAL
  Filled 2020-12-17 (×16): qty 1

## 2020-12-17 MED ORDER — SODIUM CHLORIDE 0.9 % IV SOLN
500.0000 mg | Freq: Once | INTRAVENOUS | Status: AC
  Administered 2020-12-17: 500 mg via INTRAVENOUS
  Filled 2020-12-17: qty 500

## 2020-12-17 MED ORDER — ALLOPURINOL 100 MG PO TABS
200.0000 mg | ORAL_TABLET | Freq: Every morning | ORAL | Status: DC
  Administered 2020-12-18 – 2020-12-27 (×9): 200 mg via ORAL
  Filled 2020-12-17 (×9): qty 2

## 2020-12-17 MED ORDER — ONDANSETRON HCL 4 MG/2ML IJ SOLN: 4.0000 mg | Freq: Four times a day (QID) | INTRAMUSCULAR | Status: DC | PRN

## 2020-12-17 MED ORDER — SODIUM CHLORIDE 0.9 % IV SOLN
100.0000 mg | Freq: Every day | INTRAVENOUS | Status: AC
  Administered 2020-12-18 – 2020-12-21 (×4): 100 mg via INTRAVENOUS
  Filled 2020-12-17 (×4): qty 20

## 2020-12-17 NOTE — ED Notes (Addendum)
Date and time results received: 12/17/20  9:04 PM  Test: Arterial blood gas Critical Value: 39.7  PO2 arterial  Name of Provider Notified: Zenia Resides, MD  Orders Received? Or Actions Taken?: Actions taken MD aware.

## 2020-12-17 NOTE — ED Provider Notes (Signed)
[place] DEPT Provider Note   CSN: [id number] Arrival date & time: 12/17/20  1825     History No chief complaint on file.   Marc [name] is a 87 y.o. male.  87 year old male presents from  home with several days of shortness of breath.  Patient reportedly had O2 sat of 76% on room air.  Was placed on nonrebreather.  Had a long discussion with the patient's daughter who is his medical power of attorney states that for the last several days he has just been having diffuse weakness and dyspnea.  No reported fever or chills.  No reported vomiting or diarrhea.  Patient is fully vaccinated against COVID.  No reported UTI symptoms.        Past Medical History:  Diagnosis Date  . AAA (abdominal aortic aneurysm) ([place])   . AAA (abdominal aortic aneurysm) ([place])   . Arthritis   . CKD (chronic kidney disease)    CKD III-IV (Dr. Posey Pronto, [facility])  . Coronary artery disease    s/p CABG Dr. Servando Snare, Dr. Acie Fredrickson  . Falls frequently   . Gall stones    patiet unsure  . GERD (gastroesophageal reflux disease)   . Gout   . History of Clostridium difficile infection   . History of kidney stones   . History of ulcerative colitis   . Hyperlipidemia   . Hypertension   . Kidney stone on left side    has not passed as of 09/23/12  . Stroke [facility]) 2012   affecting RUE    Patient Active Problem List   Diagnosis Date Noted  . Hammer toes of both feet 05/14/2018  . History of amputation of lesser toe of left foot ([place]) 05/14/2018  . Impaired mobility and activities of daily living 04/24/2018  . Cellulitis of left foot 04/09/2018  . Foot callus 03/28/2018  . Nail dystrophy 03/28/2018  . PAD (peripheral artery disease) ([place]) 03/28/2018  . Acquired absence of left great toe ([place]) 02/18/2018  . Diplopia   . INO (internuclear ophthalmoplegia), bilateral   . CVA (cerebral vascular accident) ([place]) 05/04/2017  . Abdominal aortic aneurysm (AAA) ([place])  04/26/2017  . Atherosclerosis of native arteries of the extremities with intermittent claudication 07/06/2014  . Pain in limb 07/06/2014  . Weakness of both legs 07/06/2014  . Numbness in both legs 07/06/2014  . Acute on chronic renal insufficiency 02/20/2013  . Essential hypertension 12/01/2012  . AAA (abdominal aortic aneurysm) ([place]) 10/28/2012  . Coronary artery disease 08/04/2012    Class: Diagnosis of  . History of stroke without residual deficits 08/04/2012    Class: Diagnosis of  . Tobacco dependence 08/04/2012  . Abdominal aneurysm without mention of rupture 01/08/2012  . CLOSTRIDIUM DIFFICILE COLITIS 08/30/2009  . DIVERTICULOSIS-COLON 08/30/2009  . WEIGHT LOSS-ABNORMAL 08/30/2009  . PERSONAL HX COLONIC POLYPS 08/30/2009    Past Surgical History:  Procedure Laterality Date  . ABDOMINAL AORTIC ENDOVASCULAR STENT GRAFT  09/29/2012   Procedure: ABDOMINAL AORTIC ENDOVASCULAR STENT GRAFT;  Surgeon: Rosetta Posner, MD;  Location: [place];  Service: Vascular;  Laterality: N/A;  GORE; ultrasound guided.  . ABDOMINAL AORTIC ENDOVASCULAR STENT GRAFT Right 04/26/2017   Procedure: ABDOMINAL AORTIC ENDOVASCULAR STENT GRAFT- REVISE RIGHT AORTIC EXPANSION;  Surgeon: Rosetta Posner, MD;  Location: [place];  Service: Vascular;  Laterality: Right;  . APPENDECTOMY  07-2009   Ruptured appendix  . CARDIAC CATHETERIZATION    . CARDIOVASCULAR STRESS TEST  07/17/12  . COLONOSCOPY    .  CORONARY ARTERY BYPASS GRAFT  08/05/2012   Procedure: CORONARY ARTERY BYPASS GRAFTING (CABG);  Surgeon: Grace Isaac, MD;  Location: [place];  Service: Open Heart Surgery;  Laterality: N/A;  . ENDARTERECTOMY FEMORAL  09/29/2012   Procedure: ENDARTERECTOMY FEMORAL;  Surgeon: Rosetta Posner, MD;  Location: [place];  Service: Vascular;  Laterality: Right;  . FEMORAL ARTERY EXPLORATION  09/29/2012   Procedure: FEMORAL ARTERY EXPLORATION;  Surgeon: Rosetta Posner, MD;  Location: [place];  Service: Vascular;  Laterality: Right;  . FRACTURE  SURGERY     rt lower leg injured when hit by ca  . PATCH ANGIOPLASTY  09/29/2012   Procedure: PATCH ANGIOPLASTY;  Surgeon: Rosetta Posner, MD;  [facility];  Service: Vascular;  Laterality: Right;       Family History  Problem Relation Age of Onset  . Stroke Mother   . Hypertension Mother   . Heart disease Sister        Aneurysm of the Brain    Social History   Tobacco Use  . Smoking status: Current Some Day Smoker    Packs/day: 1.00    Years: 68.00    Pack years: 68.00    Types: Cigarettes  . Smokeless tobacco: Never Used  . Tobacco comment: has 1-3 a week  Vaping Use  . Vaping Use: Never used  Substance Use Topics  . Alcohol use: No    Alcohol/week: 0.0 standard drinks  . Drug use: No    Home Medications Prior to Admission medications   Medication Sig Start Date End Date Taking? Authorizing Provider  acetaminophen (TYLENOL) 500 MG tablet Take 1,000 mg by mouth every 8 (eight) hours as needed for mild pain or headache.     [provider]  allopurinol (ZYLOPRIM) 100 MG tablet TAKE 200 mg BY MOUTH DAILY AFTER MEALS FOR GOUT 01/09/17   [provider]  amLODipine (NORVASC) 5 MG tablet  10/24/18   [provider]  cholecalciferol (VITAMIN D) 1000 units tablet Take 1,000 Units by mouth daily.    [provider]  clopidogrel (PLAVIX) 75 MG tablet Take 1 tablet (75 mg total) by mouth daily. 05/07/17   Ophelia Shoulder, MD  Cyanocobalamin (B-12 PO) Take 1 tablet by mouth daily.    [provider]  escitalopram (LEXAPRO) 20 MG tablet Take 20 mg by mouth at bedtime.     [provider]  gabapentin (NEURONTIN) 100 MG capsule Take 100 mg by mouth at bedtime.  02/15/14   [provider]  guaiFENesin (MUCINEX) 600 MG 12 hr tablet Take 600 mg by mouth daily as needed for cough or to loosen phlegm.     [provider]  metoprolol (LOPRESSOR) 100 MG tablet TAKE 200 MG BY MOUTH EVERY DAY FOR HYPERTENSION 12/19/16    [provider]  Multiple Vitamin (MULTIVITAMIN WITH MINERALS) TABS Take 1 tablet by mouth daily.    [provider]  ranitidine (ZANTAC) 150 MG capsule Take 150 mg by mouth at bedtime.     [provider]  rosuvastatin (CRESTOR) 20 MG tablet Take 1 tablet (20 mg total) by mouth daily at 6 PM. 05/06/17   Ophelia Shoulder, MD  sodium bicarbonate 650 MG tablet Take 650 mg by mouth 2 (two) times daily.     [provider]  tamsulosin (FLOMAX) 0.4 MG CAPS capsule Take 0.4 mg by mouth at bedtime. 01/28/17   [provider]    Allergies    No known allergies  Review of Systems   Review of Systems  Unable to perform ROS: Acuity of condition    Physical Exam Updated Vital Signs BP (!) 142/56   Pulse 72   Resp (!) 25   SpO2 (!) 81%   Physical Exam Vitals and nursing note reviewed.  Constitutional:      General: He is not in acute distress.    Appearance: He is well-nourished. He is cachectic. He is not toxic-appearing.  HENT:     Head: Normocephalic and atraumatic.  Eyes:     General: Lids are normal.     Extraocular Movements: EOM normal.     Conjunctiva/sclera: Conjunctivae normal.     Pupils: Pupils are equal, round, and reactive to light.  Neck:     Thyroid: No thyroid mass.     Trachea: No tracheal deviation.  Cardiovascular:     Rate and Rhythm: Normal rate and regular rhythm.     Heart sounds: Normal heart sounds. No murmur heard. No gallop.   Pulmonary:     Effort: Prolonged expiration and respiratory distress present.     Breath sounds: No stridor. Examination of the right-upper field reveals decreased breath sounds. Examination of the left-upper field reveals decreased breath sounds. Decreased breath sounds present. No wheezing, rhonchi or rales.  Abdominal:     General: Bowel sounds are normal. There is no distension.     Palpations: Abdomen is soft.     Tenderness: There is no abdominal tenderness. There is no CVA tenderness or  rebound.  Musculoskeletal:        General: No tenderness or edema. Normal range of motion.     Cervical back: Normal range of motion and neck supple.  Skin:    General: Skin is warm and dry.     Findings: No abrasion or rash.  Neurological:     General: No focal deficit present.     Mental Status: He is oriented to person, place, and time. He is lethargic.     GCS: GCS eye subscore is 4. GCS verbal subscore is 5. GCS motor subscore is 6.     Cranial Nerves: No cranial nerve deficit.     Sensory: No sensory deficit.     Deep Tendon Reflexes: Strength normal.  Psychiatric:        Attention and Perception: He is inattentive.        Mood and Affect: Mood and affect normal.     ED Results / Procedures / Treatments   Labs (all labs ordered are listed, but only abnormal results are displayed) Labs Reviewed  SARS CORONAVIRUS 2 BY RT PCR (HOSPITAL ORDER, [facility])  CULTURE, BLOOD (ROUTINE X 2)  CULTURE, BLOOD (ROUTINE X 2)  LACTIC ACID, PLASMA  LACTIC ACID, PLASMA  CBC WITH DIFFERENTIAL/PLATELET  COMPREHENSIVE METABOLIC PANEL  D-DIMER, QUANTITATIVE (NOT AT [facility])  PROCALCITONIN  LACTATE DEHYDROGENASE  FERRITIN  TRIGLYCERIDES  FIBRINOGEN  C-REACTIVE PROTEIN  POC SARS CORONAVIRUS 2 AG -  ED    EKG None  Radiology No results found.  Procedures Procedures (including critical care time)  Medications Ordered in ED Medications  0.9 %  sodium chloride infusion (has no administration in time range)    ED Course  I have reviewed the triage vital signs and the nursing notes.  Pertinent labs & imaging results that were available during my care of the patient were reviewed by me and considered in my medical decision making (see chart for details).  MDM Rules/Calculators/A&P                          Chest x-ray shows multifocal pneumonia.  Patient started on empiric antibiotics.  Rapid COVID test is positive.  Started on Decadron and remdesivir.   Blood gas performed and shows severe hypoxemia with a PO2 of 40.  Patient is mentating at this time.  Long discussion with daughter about CODE STATUS and he is a full code at this time.  Patient placed on high flow oxygen.  Will be admitted to the hospital service  CRITICAL CARE Performed by: Leota Jacobsen Total critical care time: 50 minutes Critical care time was exclusive of separately billable procedures and treating other patients. Critical care was necessary to treat or prevent imminent or life-threatening deterioration. Critical care was time spent personally by me on the following activities: development of treatment plan with patient and/or surrogate as well as nursing, discussions with consultants, evaluation of patient's response to treatment, examination of patient, obtaining history from patient or surrogate, ordering and performing treatments and interventions, ordering and review of laboratory studies, ordering and review of radiographic studies, pulse oximetry and re-evaluation of patient's condition.  Final Clinical Impression(s) / ED Diagnoses Final diagnoses:  None    Rx / DC Orders ED Discharge Orders    None       Lacretia Leigh, MD 12/17/20 2104

## 2020-12-17 NOTE — ED Triage Notes (Signed)
Increased weakness SOB x 3 days. 76% RA upon arrival of EMS. Nonrebreather 15l 94%. No covid testing. Pt HOH, A&Ox4 lives at home alone. Pt unable to ambulate, baseline uses walker

## 2020-12-17 NOTE — ED Notes (Signed)
[place], NP replied about page of HR being in the 30's. Orders have been given. EKG exported.
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
## 2020-12-17 NOTE — ED Notes (Signed)
Messaged doctor on amion patient is bradying down to the 30's.

## 2020-12-18 DIAGNOSIS — I714 Abdominal aortic aneurysm, without rupture: Secondary | ICD-10-CM | POA: Diagnosis not present

## 2020-12-18 DIAGNOSIS — U071 COVID-19: Secondary | ICD-10-CM | POA: Diagnosis present

## 2020-12-18 DIAGNOSIS — R001 Bradycardia, unspecified: Secondary | ICD-10-CM | POA: Diagnosis not present

## 2020-12-18 DIAGNOSIS — R9431 Abnormal electrocardiogram [ECG] [EKG]: Secondary | ICD-10-CM | POA: Diagnosis present

## 2020-12-18 DIAGNOSIS — J9601 Acute respiratory failure with hypoxia: Secondary | ICD-10-CM | POA: Diagnosis present

## 2020-12-18 DIAGNOSIS — J189 Pneumonia, unspecified organism: Secondary | ICD-10-CM | POA: Diagnosis present

## 2020-12-18 DIAGNOSIS — G9341 Metabolic encephalopathy: Secondary | ICD-10-CM | POA: Diagnosis not present

## 2020-12-18 DIAGNOSIS — N184 Chronic kidney disease, stage 4 (severe): Secondary | ICD-10-CM | POA: Diagnosis not present

## 2020-12-18 DIAGNOSIS — J1282 Pneumonia due to coronavirus disease 2019: Secondary | ICD-10-CM | POA: Diagnosis present

## 2020-12-18 DIAGNOSIS — I10 Essential (primary) hypertension: Secondary | ICD-10-CM

## 2020-12-18 DIAGNOSIS — I2510 Atherosclerotic heart disease of native coronary artery without angina pectoris: Secondary | ICD-10-CM | POA: Diagnosis not present

## 2020-12-18 LAB — TROPONIN I (HIGH SENSITIVITY)
Troponin I (High Sensitivity): 78 ng/L — ABNORMAL HIGH (ref <18)
Troponin I (High Sensitivity): 57 ng/L — ABNORMAL HIGH (ref <18)
Troponin I (High Sensitivity): 44 ng/L — ABNORMAL HIGH (ref <18)

## 2020-12-18 LAB — COMPREHENSIVE METABOLIC PANEL
ALT: 33 U/L (ref 0–44)
AST: 90 U/L — ABNORMAL HIGH (ref 15–41)
Albumin: 2.6 g/dL — ABNORMAL LOW (ref 3.5–5.0)
Alkaline Phosphatase: 54 U/L (ref 38–126)
Anion gap: 13 (ref 5–15)
BUN: 44 mg/dL — ABNORMAL HIGH (ref 8–23)
CO2: 18 mmol/L — ABNORMAL LOW (ref 22–32)
Calcium: 7.4 mg/dL — ABNORMAL LOW (ref 8.9–10.3)
Chloride: 107 mmol/L (ref 98–111)
Creatinine, Ser: 2.68 mg/dL — ABNORMAL HIGH (ref 0.61–1.24)
GFR, Estimated: 22 mL/min — ABNORMAL LOW (ref >60)
Glucose, Bld: 114 mg/dL — ABNORMAL HIGH (ref 70–99)
Potassium: 4 mmol/L (ref 3.5–5.1)
Sodium: 138 mmol/L (ref 135–145)
Total Bilirubin: 0.6 mg/dL (ref 0.3–1.2)
Total Protein: 5.5 g/dL — ABNORMAL LOW (ref 6.5–8.1)

## 2020-12-18 LAB — PHOSPHORUS: Phosphorus: 5 mg/dL — ABNORMAL HIGH (ref 2.5–4.6)

## 2020-12-18 LAB — MRSA PCR SCREENING: MRSA by PCR: NEGATIVE

## 2020-12-18 LAB — PROCALCITONIN: Procalcitonin: 0.34 ng/mL

## 2020-12-18 LAB — FERRITIN: Ferritin: 682 ng/mL — ABNORMAL HIGH (ref 24–336)

## 2020-12-18 LAB — LACTIC ACID, PLASMA
Lactic Acid, Venous: 1 mmol/L (ref 0.5–1.9)
Lactic Acid, Venous: 1.5 mmol/L (ref 0.5–1.9)

## 2020-12-18 LAB — MAGNESIUM: Magnesium: 2 mg/dL (ref 1.7–2.4)

## 2020-12-18 MED ORDER — ATROPINE SULFATE 1 MG/10ML IJ SOSY
1.0000 mg | PREFILLED_SYRINGE | Freq: Once | INTRAMUSCULAR | Status: AC
  Administered 2020-12-18: 1 mg via INTRAVENOUS
  Filled 2020-12-18: qty 10

## 2020-12-18 MED ORDER — SODIUM CHLORIDE 0.9 % IV SOLN
1000.0000 mL | INTRAVENOUS | Status: DC
  Administered 2020-12-18: 1000 mL via INTRAVENOUS

## 2020-12-18 MED ORDER — IPRATROPIUM-ALBUTEROL 20-100 MCG/ACT IN AERS
1.0000 | INHALATION_SPRAY | Freq: Four times a day (QID) | RESPIRATORY_TRACT | Status: DC
  Administered 2020-12-18 – 2020-12-22 (×14): 1 via RESPIRATORY_TRACT
  Filled 2020-12-18: qty 4

## 2020-12-18 MED ORDER — DOBUTAMINE IN D5W 4-5 MG/ML-% IV SOLN
2.5000 ug/kg/min | INTRAVENOUS | Status: DC
  Administered 2020-12-18: 2.5 ug/kg/min via INTRAVENOUS
  Filled 2020-12-18: qty 250

## 2020-12-18 MED ORDER — ZINC SULFATE 220 (50 ZN) MG PO CAPS
220.0000 mg | ORAL_CAPSULE | Freq: Every day | ORAL | Status: DC
  Administered 2020-12-18 – 2020-12-27 (×9): 220 mg via ORAL
  Filled 2020-12-18 (×9): qty 1

## 2020-12-18 MED ORDER — METHYLPREDNISOLONE SODIUM SUCC 125 MG IJ SOLR
60.0000 mg | Freq: Two times a day (BID) | INTRAMUSCULAR | Status: DC
  Administered 2020-12-18 – 2020-12-22 (×8): 60 mg via INTRAVENOUS
  Filled 2020-12-18 (×8): qty 2

## 2020-12-18 MED ORDER — CHLORHEXIDINE GLUCONATE CLOTH 2 % EX PADS
6.0000 | MEDICATED_PAD | Freq: Every day | CUTANEOUS | Status: DC
  Administered 2020-12-18 – 2020-12-24 (×7): 6 via TOPICAL

## 2020-12-18 MED ORDER — ASCORBIC ACID 500 MG PO TABS
500.0000 mg | ORAL_TABLET | Freq: Every day | ORAL | Status: DC
Start: 2020-12-18 — End: 2020-12-27
  Administered 2020-12-18 – 2020-12-27 (×9): 500 mg via ORAL
  Filled 2020-12-18 (×8): qty 1

## 2020-12-18 MED ORDER — ORAL CARE MOUTH RINSE
15.0000 mL | Freq: Two times a day (BID) | OROMUCOSAL | Status: DC
  Administered 2020-12-18 – 2020-12-27 (×16): 15 mL via OROMUCOSAL

## 2020-12-18 NOTE — Consult Note (Signed)
[facility] Nurse Consult Note: Reason for Consult: Second toe ulceration on right foot.  Chronic, non healing. Is followed by Podiatry (Dr. Keturah Barre. Dial), last appointment was 12/07/20 and next appointment is next month. Last treatment recommendation was a betadine application and I will continue this POC.  Patient with PAD. Wound type: neuropathic ulceration, PAD Pressure Injury POA:N/A Measurement:To be obtained by bedside RN prior to first treatment Wound bed: dry, red Drainage (amount, consistency, odor) None Periwound: intact, dry Dressing procedure/placement/frequency:I will provide Nursing with guidance for the care of this chronic ulcer using a twice daily betadine swabstick. Heels will be placed into Prevalon Boots for pressure injury prevention and a sacral dressing for PI prophylaxis will be placed.  [facility] nursing team will not follow, but will remain available to this patient, the nursing and medical teams.  Please re-consult if needed. Thanks, Maudie Flakes, MSN, RN, [place], Arther Abbott  Pager# [phone]

## 2020-12-18 NOTE — ED Notes (Signed)
Ina Homes, daughter, would like an update on her dad, [phone].

## 2020-12-18 NOTE — ED Notes (Signed)
Patient daughter was update on patient condition.

## 2020-12-18 NOTE — Progress Notes (Addendum)
PROGRESS NOTE    Marc [name]  [id number] DOB: [birth year] DOA: 12/17/2020 PCP: Leonard Downing, MD     Brief Narrative:  Marc [name] is a 87 y.o. WM PMHx CVA, frequent falls, Nephrolithiasis, CKD Stage IV, Essential HTN, CAD s/p CABG, AAA,  Ulcerative Colitis, at baseline pt lives with nephew, ambulates with walker.  Pt has had 2 rounds of COVID vaccine.  Pt presents to ED, per daughter pt with several day h/o weakness and SOB.  No reported fevers nor chills.  No vomiting nor diarrhea.  Symptoms constant, worsening.  Nothing seems to make better or worse.  Today developed respiratory distress, EMS called.   ED Course: Pt with severe hypoxia to 70 on RA, put on NRB -> now on 40L via HFNC.  Breathing and O2 sat improved.  COVID+  CXR shows the expected multifocal PNA.  Creat 2.95 (was 2.5 June last year).  Procalcitonin 0.47, D.Dimer 2.71, CRP 13.3.   Subjective: Afebrile overnight somnolent somewhat arousable   Assessment & Plan: Covid vaccination; vaccinated 2/3   Principal Problem:   Acute hypoxemic respiratory failure due to COVID-19 [facility]) Active Problems:   Coronary artery disease   History of stroke without residual deficits   AAA (abdominal aortic aneurysm) (HCC)   Essential hypertension   CKD (chronic kidney disease) stage 4, GFR 15-29 ml/min (HCC)   Acute metabolic encephalopathy   Acute respiratory failure with hypoxia (HCC)   Pneumonia due to COVID-19 virus   CAP (community acquired pneumonia)   Severe sinus bradycardia   QT prolongation   CKD (chronic kidney disease), stage IV (HCC)  Acute respiratory failure with hypoxia/COVID pneumonia/CAP - Attempt to keep patient euvolemic patient is not hypotensive, KVO normal saline - Baricitinib per COVID protocol - Solu-Medrol 60 mg BID - Remdesivir per pharmacy protocol -Vitamins per COVID protocol -Combivent - Flutter valve - Incentive spirometry - Prone patient 16 hours/day,  if patient cannot tolerate prone 2 to 3 hours every shift -Procalcitonin elevated indicating patient may have superimposed bacterial infection continue antibiotics x7 days for CAP - Trend procalcitonin/lactic acid  OSA - Per daughter Pt has OSA and was suppose to use BIPAP but has not used BiPAP in years.  Severe bradycardia -Per RN note physician has been notified no action taken  - Patient having bradycardic episodes in her 30s with pauses of 10 to 12 seconds. - Metoprolol 25 mg (hold) - Per cardiology Dobutamine 2.5 mcg/kg/min with no titration - Atropine at the bedside - Pacer pads -1/23 echocardiogram pending  CAD - See bradycardia - Cardiology consulted  QT prolongation - QT interval> 500 on EKG - Hold all QT prolonging eating medication -DC Zofran  Acute metabolic encephalopathy - Patient's somnolent but arousable follows some commands  CKD stage IV (baseline Cr 2.95?  Last creatinine 05/05/2017 1.78) - Strict in and out - Daily weight - Normal saline KVO given patient's poor respiratory status Lab Results  Component Value Date   CREATININE 2.68 (H) 12/17/2020   CREATININE 2.95 (H) 12/17/2020   CREATININE 1.78 (H) 05/05/2017   CREATININE 1.90 (H) 05/04/2017   CREATININE 1.88 (H) 05/04/2017        DVT prophylaxis: Lovenox Code Status: Full Family Communication: 1/23 spoke to [name] (daughter) explained plan of care answered all questions Status is: Inpatient    Dispo: The patient is from: Home              Anticipated d/c is to: Home  Anticipated d/c date is: 1/30              Patient currently unstable      Consultants:  Cardiology Dr. Carlyle Dolly  Procedures/Significant Events:    I have personally reviewed and interpreted all radiology studies and my findings are as above.  VENTILATOR SETTINGS: HHFNC 1/23 Flow 40 L/min FiO2 100% SPO2 99%  Cultures   Antimicrobials: Anti-infectives (From admission, onward)   Start      Ordered Stop   12/18/20 1000  remdesivir 100 mg in sodium chloride 0.9 % 100 mL IVPB       "Followed by" Linked Group Details   12/17/20 2121 12/22/20 0959   12/17/20 2230  remdesivir 200 mg in sodium chloride 0.9% 250 mL IVPB       "Followed by" Linked Group Details   12/17/20 2121 12/18/20 0710   12/17/20 1945  cefTRIAXone (ROCEPHIN) 1 g in sodium chloride 0.9 % 100 mL IVPB        12/17/20 1940 12/17/20 2252   12/17/20 1945  azithromycin (ZITHROMAX) 500 mg in sodium chloride 0.9 % 250 mL IVPB        12/17/20 1940 12/18/20 0711       Devices    LINES / TUBES:      Continuous Infusions: . sodium chloride    . DOBUTamine    . remdesivir 100 mg in NS 100 mL 100 mg (12/18/20 0943)     Objective: Vitals:   12/18/20 0530 12/18/20 0545 12/18/20 0600 12/18/20 0930  BP: (!) 128/55 123/62 135/62 (!) 119/58  Pulse: 65 65 66 67  Resp: '17 17 17 20  '$ Temp:      TempSrc:      SpO2: 98% 99% 97% 93%    Intake/Output Summary (Last 24 hours) at 12/18/2020 1304 Last data filed at 12/18/2020 0711 Gross per 24 hour  Intake 1600 ml  Output --  Net 1600 ml   There were no vitals filed for this visit.  Examination:  General: Somnolent but arousable follows some commands, positive acute respiratory distress Eyes: negative scleral hemorrhage, negative anisocoria, negative icterus ENT: Negative Runny nose, negative gingival bleeding, Neck:  Negative scars, masses, torticollis, lymphadenopathy, JVD Lungs: decreased breath sounds bilaterally without positive inspiratory wheezes, negative crackles Cardiovascular: Bradycardic with pauses without murmur gallop or rub normal S1 and S2 Abdomen: negative abdominal pain, nondistended, positive soft, bowel sounds, no rebound, no ascites, no appreciable mass Extremities: No significant cyanosis, clubbing, or edema bilateral lower extremities Skin: Negative rashes, lesions, ulcers Psychiatric: Unable to evaluate secondary to patient's  somnolence Central nervous system: Follows some commands will respond to painful stimuli .     Data Reviewed: Care during the described time interval was provided by me .  I have reviewed this patient's available data, including medical history, events of note, physical examination, and all test results as part of my evaluation.  CBC: Recent Labs  Lab 12/17/20 1951  WBC 5.5  NEUTROABS 4.4  HGB 10.4*  HCT 31.2*  MCV 88.1  PLT 99991111   Basic Metabolic Panel: Recent Labs  Lab 12/17/20 1951 12/17/20 2326 12/18/20 0811  NA 136 138  --   K 3.9 4.0  --   CL 103 107  --   CO2 20* 18*  --   GLUCOSE 125* 114*  --   BUN 46* 44*  --   CREATININE 2.95* 2.68*  --   CALCIUM 8.1* 7.4*  --   MG  --   --  2.0  PHOS  --   --  5.0*   GFR: CrCl cannot be calculated (Unknown ideal weight.). Liver Function Tests: Recent Labs  Lab 12/17/20 1951 12/17/20 2326  AST 97* 90*  ALT 38 33  ALKPHOS 66 54  BILITOT 0.4 0.6  PROT 6.3* 5.5*  ALBUMIN 3.2* 2.6*   No results for input(s): LIPASE, AMYLASE in the last 168 hours. No results for input(s): AMMONIA in the last 168 hours. Coagulation Profile: No results for input(s): INR, PROTIME in the last 168 hours. Cardiac Enzymes: No results for input(s): CKTOTAL, CKMB, CKMBINDEX, TROPONINI in the last 168 hours. BNP (last 3 results) No results for input(s): PROBNP in the last 8760 hours. HbA1C: No results for input(s): HGBA1C in the last 72 hours. CBG: No results for input(s): GLUCAP in the last 168 hours. Lipid Profile: Recent Labs    12/17/20 1951  TRIG 93   Thyroid Function Tests: No results for input(s): TSH, T4TOTAL, FREET4, T3FREE, THYROIDAB in the last 72 hours. Anemia Panel: Recent Labs    12/17/20 1951 12/18/20 0811  FERRITIN 666* 682*   Sepsis Labs: Recent Labs  Lab 12/17/20 1951  PROCALCITON 0.47  LATICACIDVEN 1.0  1.3    Recent Results (from the past 240 hour(s))  SARS Coronavirus 2 by RT PCR (hospital order,  performed in [facility] hospital lab) Nasopharyngeal Nasopharyngeal Swab     Status: Abnormal   Collection Time: 12/17/20  7:51 PM   Specimen: Nasopharyngeal Swab  Result Value Ref Range Status   SARS Coronavirus 2 POSITIVE (A) NEGATIVE Final    Comment: RESULT CALLED TO, READ BACK BY AND VERIFIED WITH: Montel Culver @ 2214 12/17/20 BY [facility] (NOTE) SARS-CoV-2 target nucleic acids are DETECTED  SARS-CoV-2 RNA is generally detectable in upper respiratory specimens  during the acute phase of infection.  Positive results are indicative  of the presence of the identified virus, but do not rule out bacterial infection or co-infection with other pathogens not detected by the test.  Clinical correlation with patient history and  other diagnostic information is necessary to determine patient infection status.  The expected result is negative.  Fact Sheet for Patients:   StrictlyIdeas.no   Fact Sheet for Healthcare Providers:   BankingDealers.co.za    This test is not yet approved or cleared by the [place] FDA and  has been authorized for detection and/or diagnosis of SARS-CoV-2 by FDA under an Emergency Use Authorization (EUA).  This EUA will remain in effect (meaning this  test can be used) for the duration of  the COVID-19 declaration under Section 564(b)(1) of the Act, 21 U.S.C. section 360-bbb-3(b)(1), unless the authorization is terminated or revoked sooner.  Performed at [facility], [place] [address] [place], [place] [address]   Blood Culture (routine x 2)     Status: None (Preliminary result)   Collection Time: 12/17/20  7:51 PM   Specimen: Right Antecubital; Blood  Result Value Ref Range Status   Specimen Description   Final    RIGHT ANTECUBITAL Performed at [place] [address], [place], [place] [address]    Special Requests   Final    BOTTLES DRAWN AEROBIC AND ANAEROBIC Blood  Culture results may not be optimal due to an excessive volume of blood received in culture bottles Performed at [place] [address], [place],  [address]    Culture   Final    NO GROWTH < 12 HOURS Performed at [facility], [place] [address], [place], [place] [address]    Report Status PENDING  Incomplete  Blood Culture (routine x 2)     Status: None (Preliminary result)   Collection Time: 12/17/20  7:51 PM   Specimen: Left Antecubital; Blood  Result Value Ref Range Status   Specimen Description   Final    LEFT ANTECUBITAL Performed at [place] [address] [place], [place] [address]    Special Requests   Final    BOTTLES DRAWN AEROBIC AND ANAEROBIC Blood Culture adequate volume Performed at [place] [address] [place], [place] [address]    Culture   Final    NO GROWTH < 12 HOURS Performed at [place] [address] [place],  [address]    Report Status PENDING  Incomplete         Radiology Studies: DG Chest Port 1 View  Result Date: 12/17/2020 CLINICAL DATA:  Shortness of breath for 3 days EXAM: PORTABLE CHEST 1 VIEW COMPARISON:  04/18/2018 FINDINGS: Single frontal view of the chest demonstrates patchy bilateral airspace disease greatest at the right lung base. No effusion or pneumothorax. Cardiac silhouette is mildly enlarged, likely accentuated by portable AP technique. Postsurgical changes from CABG. IMPRESSION: 1. Bibasilar airspace disease, right greater than left. Favor multifocal pneumonia over edema. Electronically Signed   By: Randa Ngo M.D.   On: 12/17/2020 19:25        Scheduled Meds: . allopurinol  200 mg Oral q morning - 10a  . amLODipine  5 mg Oral q morning - 10a  . vitamin C  500 mg Oral Daily  . atropine  1 mg Intravenous Once  . B-complex with vitamin C  1 tablet Oral Daily  . baricitinib  1 mg Oral Daily  . cholecalciferol  1,000 Units  Oral q morning - 10a  . clopidogrel  75 mg Oral q morning - 10a  . enoxaparin (LOVENOX) injection  30 mg Subcutaneous Q24H  . escitalopram  20 mg Oral QHS  . famotidine  20 mg Oral q morning - 10a  . gabapentin  100 mg Oral QHS  . Ipratropium-Albuterol  1 puff Inhalation QID  . methylPREDNISolone (SOLU-MEDROL) injection  60 mg Intravenous Q12H  . multivitamin with minerals  1 tablet Oral q morning - 10a  . rosuvastatin  20 mg Oral q morning - 10a  . sodium bicarbonate  650 mg Oral BID  . tamsulosin  0.4 mg Oral QHS  . zinc sulfate  220 mg Oral Daily   Continuous Infusions: . sodium chloride    . DOBUTamine    . remdesivir 100 mg in NS 100 mL 100 mg (12/18/20 0943)     LOS: 1 day    Time spent:40 min    Zygmunt Mcglinn, Geraldo Docker, MD [facility] Pager [phone]  If 7PM-7AM, please contact night-coverage [email or web address] Password [facility] 12/18/2020, 1:04 PM
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
## 2020-12-18 NOTE — Progress Notes (Signed)
Assisted family with tele-video via elink

## 2020-12-18 NOTE — ED Notes (Signed)
Updated bed request per the transfer order entered by the admitting physician

## 2020-12-19 ENCOUNTER — Inpatient Hospital Stay (HOSPITAL_COMMUNITY): Payer: Medicare Other

## 2020-12-19 DIAGNOSIS — U071 COVID-19: Secondary | ICD-10-CM | POA: Diagnosis not present

## 2020-12-19 DIAGNOSIS — I25810 Atherosclerosis of coronary artery bypass graft(s) without angina pectoris: Secondary | ICD-10-CM

## 2020-12-19 DIAGNOSIS — I714 Abdominal aortic aneurysm, without rupture: Secondary | ICD-10-CM | POA: Diagnosis not present

## 2020-12-19 DIAGNOSIS — J9601 Acute respiratory failure with hypoxia: Secondary | ICD-10-CM | POA: Diagnosis not present

## 2020-12-19 DIAGNOSIS — R9431 Abnormal electrocardiogram [ECG] [EKG]: Secondary | ICD-10-CM

## 2020-12-19 DIAGNOSIS — R001 Bradycardia, unspecified: Secondary | ICD-10-CM | POA: Diagnosis not present

## 2020-12-19 LAB — ECHOCARDIOGRAM LIMITED
Height: 64.5 in
Weight: 2380.97 oz

## 2020-12-19 LAB — CBC WITH DIFFERENTIAL/PLATELET
Abs Immature Granulocytes: 0.03 10*3/uL (ref 0.00–0.07)
Basophils Absolute: 0 10*3/uL (ref 0.0–0.1)
Basophils Relative: 0 %
Eosinophils Absolute: 0 10*3/uL (ref 0.0–0.5)
Eosinophils Relative: 0 %
HCT: 31.1 % — ABNORMAL LOW (ref 39.0–52.0)
Hemoglobin: 10.3 g/dL — ABNORMAL LOW (ref 13.0–17.0)
Immature Granulocytes: 1 %
Lymphocytes Relative: 12 %
Lymphs Abs: 0.8 10*3/uL (ref 0.7–4.0)
MCH: 29.2 pg (ref 26.0–34.0)
MCHC: 33.1 g/dL (ref 30.0–36.0)
MCV: 88.1 fL (ref 80.0–100.0)
Monocytes Absolute: 0.3 10*3/uL (ref 0.1–1.0)
Monocytes Relative: 5 %
Neutro Abs: 5.4 10*3/uL (ref 1.7–7.7)
Neutrophils Relative %: 82 %
Platelets: 168 10*3/uL (ref 150–400)
RBC: 3.53 MIL/uL — ABNORMAL LOW (ref 4.22–5.81)
RDW: 14.2 % (ref 11.5–15.5)
WBC: 6.5 10*3/uL (ref 4.0–10.5)
nRBC: 0 % (ref 0.0–0.2)

## 2020-12-19 LAB — COMPREHENSIVE METABOLIC PANEL
ALT: 39 U/L (ref 0–44)
AST: 79 U/L — ABNORMAL HIGH (ref 15–41)
Albumin: 2.8 g/dL — ABNORMAL LOW (ref 3.5–5.0)
Alkaline Phosphatase: 56 U/L (ref 38–126)
Anion gap: 13 (ref 5–15)
BUN: 57 mg/dL — ABNORMAL HIGH (ref 8–23)
CO2: 17 mmol/L — ABNORMAL LOW (ref 22–32)
Calcium: 8.1 mg/dL — ABNORMAL LOW (ref 8.9–10.3)
Chloride: 109 mmol/L (ref 98–111)
Creatinine, Ser: 2.4 mg/dL — ABNORMAL HIGH (ref 0.61–1.24)
GFR, Estimated: 25 mL/min — ABNORMAL LOW (ref >60)
Glucose, Bld: 124 mg/dL — ABNORMAL HIGH (ref 70–99)
Potassium: 3.8 mmol/L (ref 3.5–5.1)
Sodium: 139 mmol/L (ref 135–145)
Total Bilirubin: 0.5 mg/dL (ref 0.3–1.2)
Total Protein: 5.8 g/dL — ABNORMAL LOW (ref 6.5–8.1)

## 2020-12-19 LAB — PROCALCITONIN: Procalcitonin: 0.34 ng/mL

## 2020-12-19 LAB — MAGNESIUM: Magnesium: 2.1 mg/dL (ref 1.7–2.4)

## 2020-12-19 LAB — PHOSPHORUS: Phosphorus: 4.6 mg/dL (ref 2.5–4.6)

## 2020-12-19 LAB — D-DIMER, QUANTITATIVE: D-Dimer, Quant: 3.3 ug/mL-FEU — ABNORMAL HIGH (ref 0.00–0.50)

## 2020-12-19 LAB — C-REACTIVE PROTEIN: CRP: 10.5 mg/dL — ABNORMAL HIGH (ref <1.0)

## 2020-12-19 LAB — FERRITIN: Ferritin: 731 ng/mL — ABNORMAL HIGH (ref 24–336)

## 2020-12-19 NOTE — Progress Notes (Signed)
  Echocardiogram 2D Echocardiogram has been performed.  Marc [name] 12/19/2020, 2:49 PM

## 2020-12-19 NOTE — Progress Notes (Signed)
Progress Note  Patient Name: Marc [name] Date of Encounter: 12/19/2020  [facility] Cardiologist: Werner Lean, MD   Subjective   No CV complaints. Breathing "better". O2 sat 93% with nasal cannula on chin when I saw him, but nurse reports mid 80s throughout the day.  Inpatient Medications    Scheduled Meds: . allopurinol  200 mg Oral q morning - 10a  . amLODipine  5 mg Oral q morning - 10a  . vitamin C  500 mg Oral Daily  . B-complex with vitamin C  1 tablet Oral Daily  . baricitinib  1 mg Oral Daily  . Chlorhexidine Gluconate Cloth  6 each Topical Daily  . cholecalciferol  1,000 Units Oral q morning - 10a  . clopidogrel  75 mg Oral q morning - 10a  . enoxaparin (LOVENOX) injection  30 mg Subcutaneous Q24H  . escitalopram  20 mg Oral QHS  . famotidine  20 mg Oral q morning - 10a  . gabapentin  100 mg Oral QHS  . Ipratropium-Albuterol  1 puff Inhalation QID  . mouth rinse  15 mL Mouth Rinse BID  . methylPREDNISolone (SOLU-MEDROL) injection  60 mg Intravenous Q12H  . multivitamin with minerals  1 tablet Oral q morning - 10a  . rosuvastatin  20 mg Oral q morning - 10a  . sodium bicarbonate  650 mg Oral BID  . tamsulosin  0.4 mg Oral QHS  . zinc sulfate  220 mg Oral Daily   Continuous Infusions: . sodium chloride 1,000 mL (12/18/20 1311)  . DOBUTamine 2.5 mcg/kg/min (12/19/20 0700)  . remdesivir 100 mg in NS 100 mL 100 mg (12/18/20 0943)   PRN Meds: acetaminophen   Vital Signs    Vitals:   12/19/20 0700 12/19/20 0800 12/19/20 0900 12/19/20 0934  BP: (!) 155/61 (!) 129/45 (!) 136/48 (!) 136/48  Pulse:  73 85 82  Resp: (!) '24 19 20 17  '$ Temp:  97.7 F (36.5 C)    TempSrc:  Oral    SpO2:  96% (!) 84% 92%  Weight:      Height:        Intake/Output Summary (Last 24 hours) at 12/19/2020 1002 Last data filed at 12/19/2020 0700 Gross per 24 hour  Intake 45.17 ml  Output 375 ml  Net -329.83 ml   Last 3 Weights 12/18/2020 12/18/2020 12/24/2017   Weight (lbs) 148 lb 13 oz 152 lb 1.9 oz 154 lb  Weight (kg) 67.5 kg 69 kg 69.854 kg      Telemetry    SR w occ PVCs; no bradycardia - Personally Reviewed  ECG    No new tracings - Personally Reviewed  Physical Exam  Elderly, a little frail GEN: No acute distress.   Neck: No JVD Cardiac: RRR, no murmurs, rubs, or gallops.  Respiratory: Clear to auscultation bilaterally. GI: Soft, nontender, non-distended  MS: No edema; No deformity. Neuro:  Nonfocal  Psych: Normal affect   Labs    High Sensitivity Troponin:   Recent Labs  Lab 12/17/20 1951 12/17/20 2120 12/18/20 1212 12/18/20 1623  TROPONINIHS 81* 78* 57* 44*      Chemistry Recent Labs  Lab 12/17/20 1951 12/17/20 2326 12/19/20 0219  NA 136 138 139  K 3.9 4.0 3.8  CL 103 107 109  CO2 20* 18* 17*  GLUCOSE 125* 114* 124*  BUN 46* 44* 57*  CREATININE 2.95* 2.68* 2.40*  CALCIUM 8.1* 7.4* 8.1*  PROT 6.3* 5.5* 5.8*  ALBUMIN 3.2*  2.6* 2.8*  AST 97* 90* 79*  ALT 38 33 39  ALKPHOS 66 54 56  BILITOT 0.4 0.6 0.5  GFRNONAA 20* 22* 25*  ANIONGAP '13 13 13     '$ Hematology Recent Labs  Lab 12/17/20 1951 12/19/20 0219  WBC 5.5 6.5  RBC 3.54* 3.53*  HGB 10.4* 10.3*  HCT 31.2* 31.1*  MCV 88.1 88.1  MCH 29.4 29.2  MCHC 33.3 33.1  RDW 14.3 14.2  PLT 155 168    BNPNo results for input(s): BNP, PROBNP in the last 168 hours.   DDimer  Recent Labs  Lab 12/17/20 1951 12/19/20 0219  DDIMER 2.71* 3.30*     Radiology    DG Chest Port 1 View  Result Date: 12/17/2020 CLINICAL DATA:  Shortness of breath for 3 days EXAM: PORTABLE CHEST 1 VIEW COMPARISON:  04/18/2018 FINDINGS: Single frontal view of the chest demonstrates patchy bilateral airspace disease greatest at the right lung base. No effusion or pneumothorax. Cardiac silhouette is mildly enlarged, likely accentuated by portable AP technique. Postsurgical changes from CABG. IMPRESSION: 1. Bibasilar airspace disease, right greater than left. Favor multifocal  pneumonia over edema. Electronically Signed   By: Randa Ngo M.D.   On: 12/17/2020 19:25    Cardiac Studies   Echocardiogram 2018:  - Left ventricle: The cavity size was normal. There was mild  concentric hypertrophy. Systolic function was normal. Wall motion  was normal; there were no regional wall motion abnormalities.  Doppler parameters are consistent with abnormal left ventricular  relaxation (grade 1 diastolic dysfunction). Doppler parameters  are consistent with indeterminate ventricular filling pressure.  - Aortic valve: Transvalvular velocity was within the normal range.  There was no stenosis. There was mild regurgitation.  Regurgitation pressure half-time: 593 ms.  - Mitral valve: Transvalvular velocity was within the normal range.  There was no evidence for stenosis. There was trivial  regurgitation.  - Left atrium: The atrium was mildly dilated.  - Right ventricle: The cavity size was normal. Wall thickness was  normal. Systolic function was mildly reduced.  - Tricuspid valve: There was mild regurgitation.  - Pulmonary arteries: Systolic pressure was within the normal  range. PA peak pressure: 29 mm Hg (S).   04/24/2017 NM Stress:  Nuclear stress EF: 53%.  There was no ST segment deviation noted during stress.  The left ventricular ejection fraction is mildly decreased (45-54%).  This is a low risk study.  1. EF 53%, no regional wall motion abnormalities noted.  2. Fixed small, mild basal inferolateral perfusion defect. Given normal wall motion, this may represent attenuation. No evidence for significant ischemia.   Low risk study.    Patient Profile     87 y.o. male with a PMH of CAD s/p 2013 CABG (LIMA to LAD, SVG to Diagonal, SVG sequentially to OM1 and OM2, SVG sequentially to distal Circumflex and RCA), prior CVA, AAA s/p graft, PAD, HTN, HLD, ulcerative colitis, CKD Stage IV, OSA, and tobacco use, who is being followed by  cardiology for bradycardia.   Assessment & Plan    1. Bradycardia: patient noted to have bradycardic to the 30s on arrival 12/17/20. He was given a dose of atropine without significant change and started on dopamine. Seen by Dr. Gasper Sells yesterday who observed no evidence of marked bradycardia, pauses, or asystole on telemetry and suspected false readings in the setting of low voltage criteria. Dopamine stopped 12/18/20. No documented bradycardia overnight. Telemetry reviewed - occasional PVCs, rare trigeminy, rare couplets.  -  echocardiogram reviewed, but is a very poor quality non-diagnostic study  2. Acute respiratory failure in the setting of COVID-19: patient presented with several days of weakness and SOB. On the day of arrival was hypoxic. O2 sats in the 70s on arrival to the ED, improved with NRB>HFNC. CXR showed multifocal PNA. He was started on remdesivir. Baricitinib, steroids, antibiotics, and supportive care with nebulizers/cough suppressants.  - Continue management per primary team  3. CAD s/p CABG: no complaints of chest pain. EKG non-ischemic - Continue plavix and statin - BBlocker on hold as above  4. HTN: BP control is fair - continue amlodipine  5. HLD: no recent lipids on file - Continue rosuvastatin  6. Tobacco abuse:  - Continue to encourage smoking cessation  [facility] will sign off.   Medication Recommendations:  Can leave off beta blockers while in hospital, review need for beta blocker at OP follow up Other recommendations (labs, testing, etc):  better to repeat the echo when his respiratory status is back to baseline and he can cooperate with an outpatient study Follow up as an outpatient:  2-3 weeks after hospital DC  For questions or updates, please contact [facility] Please consult [email or web address] for contact info under        Signed, Abigail Butts, PA-C  12/19/2020, 10:02 AM

## 2020-12-19 NOTE — TOC Initial Note (Signed)
Transition of Care [facility]) - Initial/Assessment Note    Patient Details  Name: Marc [name] MRN: [id number] Date of Birth: [birth year]  Transition of [facility]) CM/SW Contact:    Leeroy Cha, RN Phone Number: 12/19/2020, 10:12 AM  Clinical Narrative:                  87 y.o. male with medical history significant of CKD 4, CAD s/p CABG, prior stroke.  At baseline pt lives with nephew, ambulates with walker.  Pt has had 2 rounds of COVID vaccine.  Pt presents to ED, per daughter pt with several day h/o weakness and SOB.  No reported fevers nor chills.  No vomiting nor diarrhea.  Symptoms constant, worsening.  Nothing seems to make better or worse.  Today developed respiratory distress, EMS called.   ED Course: Pt with severe hypoxia to 70 on RA, put on NRB -> now on 40L via HFNC.  Breathing and O2 sat improved.  COVID+  CXR shows the expected multifocal PNA.  Creat 2.95 (was 2.5 June last year). PLAN: TO RETURN TO HOME WITH WIFE WITH SELF CARE FOLLOWING FOR PROGRESSION. Expected Discharge Plan: Home/Self Care Barriers to Discharge: Continued Medical Work up   Patient Goals and CMS Choice Patient states their goals for this hospitalization and ongoing recovery are:: TO [place] CMS Medicare.gov Compare Post Acute Care list provided to:: Patient    Expected Discharge Plan and Services Expected Discharge Plan: Home/Self Care   Discharge Planning Services: CM Consult   Living arrangements for the past 2 months: Single Family Home                                      Prior Living Arrangements/Services Living arrangements for the past 2 months: Single Family Home Lives with:: Spouse Patient language and need for interpreter reviewed:: Yes Do you feel safe going back to the place where you live?: Yes      Need for Family Participation in Patient Care: Yes (Comment) Care giver support system in place?: Yes (comment)   Criminal Activity/Legal  Involvement Pertinent to Current Situation/Hospitalization: No - Comment as needed  Activities of Daily Living      Permission Sought/Granted                  Emotional Assessment Appearance:: Appears stated age Attitude/Demeanor/Rapport: Engaged Affect (typically observed): Calm Orientation: : Oriented to Place,Oriented to Self,Oriented to Situation,Oriented to  Time Alcohol / Substance Use: Not Applicable Psych Involvement: No (comment)  Admission diagnosis:  Acute hypoxemic respiratory failure due to COVID-19 ([place]) [U07.1, J96.01] COVID [U07.1] Patient Active Problem List   Diagnosis Date Noted  . Acute respiratory failure with hypoxia ([place]) 12/18/2020  . Pneumonia due to COVID-19 virus 12/18/2020  . CAP (community acquired pneumonia) 12/18/2020  . Severe sinus bradycardia 12/18/2020  . QT prolongation 12/18/2020  . CKD (chronic kidney disease), stage IV ([place]) 12/18/2020  . CKD (chronic kidney disease) stage 4, GFR 15-29 ml/min ([facility]) 12/17/2020  . Acute hypoxemic respiratory failure due to COVID-19 ([place]) 12/17/2020  . Acute metabolic encephalopathy [id number]  . Hammer toes of both feet 05/14/2018  . History of amputation of lesser toe of left foot ([place]) 05/14/2018  . Impaired mobility and activities of daily living 04/24/2018  . Cellulitis of left foot 04/09/2018  . Foot callus 03/28/2018  . Nail dystrophy 03/28/2018  . PAD (peripheral artery  disease) ([place]) 03/28/2018  . Acquired absence of left great toe ([place]) 02/18/2018  . Diplopia   . INO (internuclear ophthalmoplegia), bilateral   . CVA (cerebral vascular accident) ([place]) 05/04/2017  . Abdominal aortic aneurysm (AAA) ([place]) 04/26/2017  . Atherosclerosis of native arteries of the extremities with intermittent claudication 07/06/2014  . Pain in limb 07/06/2014  . Weakness of both legs 07/06/2014  . Numbness in both legs 07/06/2014  . Acute on chronic renal insufficiency 02/20/2013  . Essential hypertension  12/01/2012  . AAA (abdominal aortic aneurysm) ([place]) 10/28/2012  . Coronary artery disease 08/04/2012    Class: Diagnosis of  . History of stroke without residual deficits 08/04/2012    Class: Diagnosis of  . Tobacco dependence 08/04/2012  . Abdominal aneurysm without mention of rupture 01/08/2012  . CLOSTRIDIUM DIFFICILE COLITIS 08/30/2009  . DIVERTICULOSIS-COLON 08/30/2009  . WEIGHT LOSS-ABNORMAL 08/30/2009  . PERSONAL HX COLONIC POLYPS 08/30/2009   PCP:  Leonard Downing, MD Pharmacy:   [id number] [place] [address]Phone: [phone]: [phone]    Social Determinants of Health (SDOH) Interventions    Readmission Risk Interventions No flowsheet data found.

## 2020-12-19 NOTE — Progress Notes (Signed)
PROGRESS NOTE    Marc [name]  [id number] DOB: 04-28-1930 DOA: 12/17/2020 PCP: Leonard Downing, MD     Brief Narrative:  Marc [name] is a 87 y.o. WM PMHx CVA, frequent falls, Nephrolithiasis, CKD Stage IV, Essential HTN, CAD s/p CABG, AAA,  Ulcerative Colitis, at baseline pt lives with nephew, ambulates with walker.  Pt has had 2 rounds of COVID vaccine.  Pt presents to ED, per daughter pt with several day h/o weakness and SOB.  No reported fevers nor chills.  No vomiting nor diarrhea.  Symptoms constant, worsening.  Nothing seems to make better or worse.  Today developed respiratory distress, EMS called.   ED Course: Pt with severe hypoxia to 70 on RA, put on NRB -> now on 40L via HFNC.  Breathing and O2 sat improved.  COVID+  CXR shows the expected multifocal PNA.  Creat 2.95 (was 2.5 June last year).  Procalcitonin 0.47, D.Dimer 2.71, CRP 13.3.   Subjective: 1/24 afebrile overnight A/O x2 (does not know when, why), but much more awake today.   Assessment & Plan: Covid vaccination; vaccinated 2/3   Principal Problem:   Acute hypoxemic respiratory failure due to COVID-19 [facility]) Active Problems:   Coronary artery disease   History of stroke without residual deficits   AAA (abdominal aortic aneurysm) (HCC)   Essential hypertension   CKD (chronic kidney disease) stage 4, GFR 15-29 ml/min (HCC)   Acute metabolic encephalopathy   Acute respiratory failure with hypoxia (HCC)   Pneumonia due to COVID-19 virus   CAP (community acquired pneumonia)   Severe sinus bradycardia   QT prolongation   CKD (chronic kidney disease), stage IV (HCC)  Acute respiratory failure with hypoxia/COVID pneumonia/CAP - Attempt to keep patient euvolemic patient is not hypotensive, KVO normal saline - Baricitinib per COVID protocol - Solu-Medrol 60 mg BID - Remdesivir per pharmacy protocol -Vitamins per COVID protocol -Combivent - Flutter valve - Incentive  spirometry - Prone patient 16 hours/day, if patient cannot tolerate prone 2 to 3 hours every shift -Procalcitonin elevated indicating patient may have superimposed bacterial infection continue antibiotics x7 days for CAP - Trend procalcitonin/lactic acid Results for Marc [name], Marc [name] (MRN [id number]) as of 12/19/2020 17:13  Ref. Range 12/17/2020 19:51 12/18/2020 16:23 12/19/2020 02:19  Procalcitonin Latest Units: ng/mL 0.47 0.34 0.34  Results for Marc [name], Marc [name] (MRN [id number]) as of 12/19/2020 17:13  Ref. Range 12/17/2020 19:51 12/17/2020 19:51 12/18/2020 16:23  Lactic Acid, Venous Latest Ref Range: 0.5 - 1.9 mmol/L 1.3 1.0 1.5    OSA - Per daughter Pt has OSA and was suppose to use BIPAP but has not used BiPAP in years.  Severe bradycardia -Per RN note physician has been notified no action taken  - Patient having bradycardic episodes in her 30s with pauses of 10 to 12 seconds. - Metoprolol 25 mg (hold) - Per cardiology Dobutamine 2.5 mcg/kg/min with no titration - Atropine at the bedside - Pacer pads -1/24 echocardiogram nondiagnostic -Per cardiology can leave off beta-blockers while in hospital review need for beta-blockers at outpatient follow-up   CAD - See bradycardia - Cardiology consulted  QT prolongation - QT interval> 500 on EKG - Hold all QT prolonging eating medication -DC Zofran  Acute metabolic encephalopathy - Patient's somnolent but arousable follows some commands -1/24 patient's mental status appears to be clearing.  CKD stage IV (baseline Cr 2.95?  Last creatinine 05/05/2017 1.78) - Strict in and out -1.6 L - Daily weight Filed Weights   12/18/20  1300 12/18/20 1525  Weight: 69 kg 67.5 kg  - Normal saline KVO given patient's poor respiratory status Lab Results  Component Value Date   CREATININE 2.40 (H) 12/19/2020   CREATININE 2.68 (H) 12/17/2020   CREATININE 2.95 (H) 12/17/2020   CREATININE 1.78 (H) 05/05/2017   CREATININE 1.90 (H) 05/04/2017  -Renal  status improved      DVT prophylaxis: Lovenox Code Status: Full Family Communication: 1/23 spoke to Marc [name] (daughter) explained plan of care answered all questions Status is: Inpatient    Dispo: The patient is from: Home              Anticipated d/c is to: Home              Anticipated d/c date is: 1/30              Patient currently unstable      Consultants:  Cardiology Dr. Carlyle Dolly  Procedures/Significant Events:    I have personally reviewed and interpreted all radiology studies and my findings are as above.  VENTILATOR SETTINGS: Nasal cannula 1/24 Flow 15 L/min SPO2 92%   Cultures   Antimicrobials: Anti-infectives (From admission, onward)   Start     Ordered Stop   12/18/20 1000  remdesivir 100 mg in sodium chloride 0.9 % 100 mL IVPB       "Followed by" Linked Group Details   12/17/20 2121 12/22/20 0959   12/17/20 2230  remdesivir 200 mg in sodium chloride 0.9% 250 mL IVPB       "Followed by" Linked Group Details   12/17/20 2121 12/18/20 0710   12/17/20 1945  cefTRIAXone (ROCEPHIN) 1 g in sodium chloride 0.9 % 100 mL IVPB        12/17/20 1940 12/17/20 2252   12/17/20 1945  azithromycin (ZITHROMAX) 500 mg in sodium chloride 0.9 % 250 mL IVPB        12/17/20 1940 12/18/20 0711       Devices    LINES / TUBES:      Continuous Infusions: . sodium chloride 1,000 mL (12/18/20 1311)  . DOBUTamine 2.5 mcg/kg/min (12/19/20 0700)  . remdesivir 100 mg in NS 100 mL 100 mg (12/18/20 0943)     Objective: Vitals:   12/19/20 0700 12/19/20 0800 12/19/20 0900 12/19/20 0934  BP: (!) 155/61 (!) 129/45 (!) 136/48 (!) 136/48  Pulse:  73 85 82  Resp: (!) '24 19 20 17  '$ Temp:  97.7 F (36.5 C)    TempSrc:  Oral    SpO2:  96% (!) 84% 92%  Weight:      Height:        Intake/Output Summary (Last 24 hours) at 12/19/2020 1221 Last data filed at 12/19/2020 0700 Gross per 24 hour  Intake 45.17 ml  Output 375 ml  Net -329.83 ml   Filed Weights    12/18/20 1300 12/18/20 1525  Weight: 69 kg 67.5 kg   Physical Exam:  General: A/O x2 (does not know when, why) positive acute respiratory distress Eyes: negative scleral hemorrhage, negative anisocoria, negative icterus ENT: Negative Runny nose, negative gingival bleeding, Neck:  Negative scars, masses, torticollis, lymphadenopathy, JVD Lungs: decreased breath sounds bilaterally without wheezes or crackles Cardiovascular: Regular rate and rhythm without murmur gallop or rub normal S1 and S2 Abdomen: negative abdominal pain, nondistended, positive soft, bowel sounds, no rebound, no ascites, no appreciable mass Extremities: No significant cyanosis, clubbing, or edema bilateral lower extremities Skin: Negative rashes, lesions, ulcers Psychiatric:  Negative depression,  negative anxiety, negative fatigue, negative mania  Central nervous system:  Cranial nerves II through XII intact, tongue/uvula midline, all extremities muscle strength 5/5, sensation intact throughout, negative dysarthria, negative expressive aphasia, negative receptive aphasia.  .     Data Reviewed: Care during the described time interval was provided by me .  I have reviewed this patient's available data, including medical history, events of note, physical examination, and all test results as part of my evaluation.  CBC: Recent Labs  Lab 12/17/20 1951 12/19/20 0219  WBC 5.5 6.5  NEUTROABS 4.4 5.4  HGB 10.4* 10.3*  HCT 31.2* 31.1*  MCV 88.1 88.1  PLT 155 [id number]   Basic Metabolic Panel: Recent Labs  Lab 12/17/20 1951 12/17/20 2326 12/18/20 0811 12/19/20 0219  NA 136 138  --  139  K 3.9 4.0  --  3.8  CL 103 107  --  109  CO2 20* 18*  --  17*  GLUCOSE 125* 114*  --  124*  BUN 46* 44*  --  57*  CREATININE 2.95* 2.68*  --  2.40*  CALCIUM 8.1* 7.4*  --  8.1*  MG  --   --  2.0 2.1  PHOS  --   --  5.0* 4.6   GFR: Estimated Creatinine Clearance: 17.8 mL/min (A) (by C-G formula based on SCr of 2.4 mg/dL  (H)). Liver Function Tests: Recent Labs  Lab 12/17/20 1951 12/17/20 2326 12/19/20 0219  AST 97* 90* 79*  ALT 38 33 39  ALKPHOS 66 54 56  BILITOT 0.4 0.6 0.5  PROT 6.3* 5.5* 5.8*  ALBUMIN 3.2* 2.6* 2.8*   No results for input(s): LIPASE, AMYLASE in the last 168 hours. No results for input(s): AMMONIA in the last 168 hours. Coagulation Profile: No results for input(s): INR, PROTIME in the last 168 hours. Cardiac Enzymes: No results for input(s): CKTOTAL, CKMB, CKMBINDEX, TROPONINI in the last 168 hours. BNP (last 3 results) No results for input(s): PROBNP in the last 8760 hours. HbA1C: No results for input(s): HGBA1C in the last 72 hours. CBG: No results for input(s): GLUCAP in the last 168 hours. Lipid Profile: Recent Labs    12/17/20 1951  TRIG 93   Thyroid Function Tests: No results for input(s): TSH, T4TOTAL, FREET4, T3FREE, THYROIDAB in the last 72 hours. Anemia Panel: Recent Labs    12/18/20 0811 12/19/20 0219  FERRITIN 682* 731*   Sepsis Labs: Recent Labs  Lab 12/17/20 1951 12/18/20 1623 12/19/20 0219  PROCALCITON 0.47 0.34 0.34  LATICACIDVEN 1.0  1.3 1.5  --     Recent Results (from the past 240 hour(s))  SARS Coronavirus 2 by RT PCR (hospital order, performed in [facility] hospital lab) Nasopharyngeal Nasopharyngeal Swab     Status: Abnormal   Collection Time: 12/17/20  7:51 PM   Specimen: Nasopharyngeal Swab  Result Value Ref Range Status   SARS Coronavirus 2 POSITIVE (A) NEGATIVE Final    Comment: RESULT CALLED TO, READ BACK BY AND VERIFIED WITH: Montel Culver @ 2214 12/17/20 BY SJT (NOTE) SARS-CoV-2 target nucleic acids are DETECTED  SARS-CoV-2 RNA is generally detectable in upper respiratory specimens  during the acute phase of infection.  Positive results are indicative  of the presence of the identified virus, but do not rule out bacterial infection or co-infection with other pathogens not detected by the test.  Clinical correlation  with patient history and  other diagnostic information is necessary to determine patient infection status.  The expected result is negative.  Fact Sheet for Patients:   StrictlyIdeas.no   Fact Sheet for Healthcare Providers:   BankingDealers.co.za    This test is not yet approved or cleared by the [place] FDA and  has been authorized for detection and/or diagnosis of SARS-CoV-2 by FDA under an Emergency Use Authorization (EUA).  This EUA will remain in effect (meaning this  test can be used) for the duration of  the COVID-19 declaration under Section 564(b)(1) of the Act, 21 U.S.C. section 360-bbb-3(b)(1), unless the authorization is terminated or revoked sooner.  Performed at [facility], [place] [address], [place], [place] [address]   Blood Culture (routine x 2)     Status: None (Preliminary result)   Collection Time: 12/17/20  7:51 PM   Specimen: Right Antecubital; Blood  Result Value Ref Range Status   Specimen Description   Final    RIGHT ANTECUBITAL Performed at [facility] [address], [place], [place] [address]    Special Requests   Final    BOTTLES DRAWN AEROBIC AND ANAEROBIC Blood Culture results may not be optimal due to an excessive volume of blood received in culture bottles Performed at [facility] [address], [place], [place] [address]    Culture   Final    NO GROWTH < 12 HOURS Performed at [facility] [address], [place], [place] [address]    Report Status PENDING  Incomplete  Blood Culture (routine x 2)     Status: None (Preliminary result)   Collection Time: 12/17/20  7:51 PM   Specimen: Left Antecubital; Blood  Result Value Ref Range Status   Specimen Description   Final    LEFT ANTECUBITAL Performed at [facility] [address], [place], [place] [address]    Special Requests   Final    BOTTLES DRAWN  AEROBIC AND ANAEROBIC Blood Culture adequate volume Performed at [facility] [address], [place], [place] [address]    Culture   Final    NO GROWTH < 12 HOURS Performed at [facility] [address], [place], [place] [address]    Report Status PENDING  Incomplete  MRSA PCR Screening     Status: None   Collection Time: 12/18/20  3:31 PM   Specimen: Nasal Mucosa; Nasopharyngeal  Result Value Ref Range Status   MRSA by PCR NEGATIVE NEGATIVE Final    Comment:        The GeneXpert MRSA Assay (FDA approved for NASAL specimens only), is one component of a comprehensive MRSA colonization surveillance program. It is not intended to diagnose MRSA infection nor to guide or monitor treatment for MRSA infections. Performed at [facility], [place] [address], [place], [place] [address]          Radiology Studies: DG Chest Port 1 View  Result Date: 12/17/2020 CLINICAL DATA:  Shortness of breath for 3 days EXAM: PORTABLE CHEST 1 VIEW COMPARISON:  04/18/2018 FINDINGS: Single frontal view of the chest demonstrates patchy bilateral airspace disease greatest at the right lung base. No effusion or pneumothorax. Cardiac silhouette is mildly enlarged, likely accentuated by portable AP technique. Postsurgical changes from CABG. IMPRESSION: 1. Bibasilar airspace disease, right greater than left. Favor multifocal pneumonia over edema. Electronically Signed   By: Randa Ngo M.D.   On: 12/17/2020 19:25        Scheduled Meds: . allopurinol  200 mg Oral q morning - 10a  .  amLODipine  5 mg Oral q morning - 10a  . vitamin C  500 mg Oral Daily  . B-complex with vitamin C  1 tablet Oral Daily  . baricitinib  1 mg Oral Daily  . Chlorhexidine Gluconate Cloth  6 each Topical Daily  . cholecalciferol  1,000 Units Oral q morning - 10a  . clopidogrel  75 mg Oral q morning - 10a  . enoxaparin (LOVENOX) injection  30 mg Subcutaneous Q24H  . escitalopram   20 mg Oral QHS  . famotidine  20 mg Oral q morning - 10a  . gabapentin  100 mg Oral QHS  . Ipratropium-Albuterol  1 puff Inhalation QID  . mouth rinse  15 mL Mouth Rinse BID  . methylPREDNISolone (SOLU-MEDROL) injection  60 mg Intravenous Q12H  . multivitamin with minerals  1 tablet Oral q morning - 10a  . rosuvastatin  20 mg Oral q morning - 10a  . sodium bicarbonate  650 mg Oral BID  . tamsulosin  0.4 mg Oral QHS  . zinc sulfate  220 mg Oral Daily   Continuous Infusions: . sodium chloride 1,000 mL (12/18/20 1311)  . DOBUTamine 2.5 mcg/kg/min (12/19/20 0700)  . remdesivir 100 mg in NS 100 mL 100 mg (12/18/20 0943)     LOS: 2 days    Time spent:40 min    Sherene Plancarte, Geraldo Docker, MD [facility] Pager [phone]  If 7PM-7AM, please contact night-coverage [email or web address] Password [facility] 12/19/2020, 12:21 PM

## 2020-12-19 NOTE — Progress Notes (Signed)
Assisted tele visit to patient with daughter. ° °Maddalynn Barnard P, RN  °

## 2020-12-19 NOTE — Progress Notes (Signed)
Assisted tele visit to patient with family member.  Zethan Alfieri McEachran, RN  

## 2020-12-20 DIAGNOSIS — I714 Abdominal aortic aneurysm, without rupture: Secondary | ICD-10-CM | POA: Diagnosis not present

## 2020-12-20 DIAGNOSIS — I25810 Atherosclerosis of coronary artery bypass graft(s) without angina pectoris: Secondary | ICD-10-CM | POA: Diagnosis not present

## 2020-12-20 DIAGNOSIS — R001 Bradycardia, unspecified: Secondary | ICD-10-CM | POA: Diagnosis not present

## 2020-12-20 DIAGNOSIS — U071 COVID-19: Secondary | ICD-10-CM | POA: Diagnosis not present

## 2020-12-20 LAB — BLOOD CULTURE ID PANEL (REFLEXED) - BCID2

## 2020-12-20 LAB — CBC WITH DIFFERENTIAL/PLATELET
Abs Immature Granulocytes: 0.05 10*3/uL (ref 0.00–0.07)
Basophils Absolute: 0 10*3/uL (ref 0.0–0.1)
Basophils Relative: 0 %
Eosinophils Absolute: 0 10*3/uL (ref 0.0–0.5)
Eosinophils Relative: 0 %
HCT: 28.2 % — ABNORMAL LOW (ref 39.0–52.0)
Hemoglobin: 9.4 g/dL — ABNORMAL LOW (ref 13.0–17.0)
Immature Granulocytes: 1 %
Lymphocytes Relative: 7 %
Lymphs Abs: 0.6 10*3/uL — ABNORMAL LOW (ref 0.7–4.0)
MCH: 29 pg (ref 26.0–34.0)
MCHC: 33.3 g/dL (ref 30.0–36.0)
MCV: 87 fL (ref 80.0–100.0)
Monocytes Absolute: 0.2 10*3/uL (ref 0.1–1.0)
Monocytes Relative: 2 %
Neutro Abs: 7.5 10*3/uL (ref 1.7–7.7)
Neutrophils Relative %: 90 %
Platelets: 169 10*3/uL (ref 150–400)
RBC: 3.24 MIL/uL — ABNORMAL LOW (ref 4.22–5.81)
RDW: 14.3 % (ref 11.5–15.5)
WBC: 8.3 10*3/uL (ref 4.0–10.5)
nRBC: 0 % (ref 0.0–0.2)

## 2020-12-20 LAB — MAGNESIUM: Magnesium: 2.1 mg/dL (ref 1.7–2.4)

## 2020-12-20 LAB — COMPREHENSIVE METABOLIC PANEL
ALT: 38 U/L (ref 0–44)
AST: 58 U/L — ABNORMAL HIGH (ref 15–41)
Albumin: 2.7 g/dL — ABNORMAL LOW (ref 3.5–5.0)
Alkaline Phosphatase: 54 U/L (ref 38–126)
Anion gap: 16 — ABNORMAL HIGH (ref 5–15)
BUN: 67 mg/dL — ABNORMAL HIGH (ref 8–23)
CO2: 15 mmol/L — ABNORMAL LOW (ref 22–32)
Calcium: 8.2 mg/dL — ABNORMAL LOW (ref 8.9–10.3)
Chloride: 109 mmol/L (ref 98–111)
Creatinine, Ser: 2.18 mg/dL — ABNORMAL HIGH (ref 0.61–1.24)
GFR, Estimated: 28 mL/min — ABNORMAL LOW (ref >60)
Glucose, Bld: 131 mg/dL — ABNORMAL HIGH (ref 70–99)
Potassium: 4 mmol/L (ref 3.5–5.1)
Sodium: 140 mmol/L (ref 135–145)
Total Bilirubin: 0.7 mg/dL (ref 0.3–1.2)
Total Protein: 5.5 g/dL — ABNORMAL LOW (ref 6.5–8.1)

## 2020-12-20 LAB — PHOSPHORUS: Phosphorus: 4.4 mg/dL (ref 2.5–4.6)

## 2020-12-20 LAB — FERRITIN: Ferritin: 712 ng/mL — ABNORMAL HIGH (ref 24–336)

## 2020-12-20 LAB — C-REACTIVE PROTEIN: CRP: 6.5 mg/dL — ABNORMAL HIGH (ref <1.0)

## 2020-12-20 LAB — D-DIMER, QUANTITATIVE: D-Dimer, Quant: 4.02 ug/mL-FEU — ABNORMAL HIGH (ref 0.00–0.50)

## 2020-12-20 LAB — PROCALCITONIN: Procalcitonin: 0.27 ng/mL

## 2020-12-20 MED ORDER — SODIUM BICARBONATE 8.4 % IV SOLN
50.0000 meq | Freq: Once | INTRAVENOUS | Status: AC
  Administered 2020-12-20: 50 meq via INTRAVENOUS
  Filled 2020-12-20: qty 50

## 2020-12-20 MED ORDER — DEXTROSE 5 % IV SOLN: INTRAVENOUS | Status: DC

## 2020-12-20 NOTE — Progress Notes (Signed)
RN cut Dobutamine off per Sherral Hammers, MD order at bedside at 0930

## 2020-12-20 NOTE — Progress Notes (Signed)
PROGRESS NOTE    Marc [name]  [id number] DOB: September 05, 1930 DOA: 12/17/2020 PCP: Leonard Downing, MD     Brief Narrative:  Marc [name] is a 86 y.o. WM PMHx CVA, frequent falls, Nephrolithiasis, CKD Stage IV, Essential HTN, CAD s/p CABG, AAA,  Ulcerative Colitis, at baseline pt lives with nephew, ambulates with walker.  Pt has had 2 rounds of COVID vaccine.  Pt presents to ED, per daughter pt with several day h/o weakness and SOB.  No reported fevers nor chills.  No vomiting nor diarrhea.  Symptoms constant, worsening.  Nothing seems to make better or worse.  Today developed respiratory distress, EMS called.   ED Course: Pt with severe hypoxia to 70 on RA, put on NRB -> now on 40L via HFNC.  Breathing and O2 sat improved.  COVID+  CXR shows the expected multifocal PNA.  Creat 2.95 (was 2.5 June last year).  Procalcitonin 0.47, D.Dimer 2.71, CRP 13.3.   Subjective: 1/25 afebrile overnight A/O x1 (does not know where, when, why), follows commands but attempting to get out of the bed.  Redirectable   Assessment & Plan: Covid vaccination; vaccinated 2/3   Principal Problem:   Acute hypoxemic respiratory failure due to COVID-19 [facility]) Active Problems:   Coronary artery disease   History of stroke without residual deficits   AAA (abdominal aortic aneurysm) (HCC)   Essential hypertension   CKD (chronic kidney disease) stage 4, GFR 15-29 ml/min (HCC)   Acute metabolic encephalopathy   Acute respiratory failure with hypoxia (HCC)   Pneumonia due to COVID-19 virus   CAP (community acquired pneumonia)   Severe sinus bradycardia   QT prolongation   CKD (chronic kidney disease), stage IV (HCC)  Acute respiratory failure with hypoxia/COVID pneumonia/CAP - Attempt to keep patient euvolemic patient is not hypotensive, KVO normal saline - Baricitinib per COVID protocol - Solu-Medrol 60 mg BID - Remdesivir per pharmacy protocol -Vitamins per COVID  protocol -Combivent - Flutter valve - Incentive spirometry - Prone patient 16 hours/day, if patient cannot tolerate prone 2 to 3 hours every shift -Procalcitonin elevated indicating patient may have superimposed bacterial infection continue antibiotics x7 days for CAP - Trend procalcitonin/lactic acid Results for Marc [name], Marc [name] (MRN [id number]) as of 12/20/2020 14:31  Ref. Range 12/17/2020 19:51 12/18/2020 16:23 12/19/2020 02:19 12/20/2020 02:59  Procalcitonin Latest Units: ng/mL 0.47 0.34 0.34 0.27   Results for Marc [name], Marc [name] (MRN [id number]) as of 12/19/2020 17:13  Ref. Range 12/17/2020 19:51 12/17/2020 19:51 12/18/2020 16:23  Lactic Acid, Venous Latest Ref Range: 0.5 - 1.9 mmol/L 1.3 1.0 1.5    OSA - Per daughter Pt has OSA and was suppose to use BIPAP but has not used BiPAP in years.  Severe bradycardia -Per RN note physician has been notified no action taken  - Patient having bradycardic episodes in her 30s with pauses of 10 to 12 seconds. - Metoprolol 25 mg (hold) - Per cardiology Dobutamine 2.5 mcg/kg/min with no titration - Atropine at the bedside - Pacer pads -1/24 echocardiogram nondiagnostic -Per cardiology can leave off beta-blockers while in hospital review need for beta-blockers at outpatient follow-up   CAD - See bradycardia - Cardiology consulted  QT prolongation - QT interval> 500 on EKG - Hold all QT prolonging eating medication -DC Zofran  Acute metabolic encephalopathy - Patient's somnolent but arousable follows some commands -1/24 patient's mental status appears to be clearing. -1/25 slightly worse today  CKD stage IV (baseline Cr 2.95?  Last creatinine 05/05/2017  1.78) - Strict in and out +1.7 L - Daily weight Filed Weights   12/18/20 1300 12/18/20 1525  Weight: 69 kg 67.5 kg  - Normal saline KVO given patient's poor respiratory status Lab Results  Component Value Date   CREATININE 2.18 (H) 12/20/2020   CREATININE 2.40 (H) 12/19/2020   CREATININE  2.68 (H) 12/17/2020   CREATININE 2.95 (H) 12/17/2020   CREATININE 1.78 (H) 05/05/2017  -Renal status improved  Anion gap metabolic acidosis -Sodium bicarbonate p.o. 650 mg BID -1/25 sodium bicarb 1 amp   Anorexia -1/25 D5W 91m/hr  RIGHT foot second toe ulceration -W OC consultation -Recommendations:Reason for Consult: Second toe ulceration on right foot.  Chronic, non healing. Is followed by Podiatry (Dr. DKeturah Barre Dial), last appointment was 12/07/20 and next appointment is next month. Last treatment recommendation was a betadine application and I will continue this POC.  Patient with PAD. Wound type: neuropathic ulceration, PAD Pressure Injury POA:N/A Measurement:To be obtained by bedside RN prior to first treatment Wound bed: dry, red Drainage (amount, consistency, odor) None Periwound: intact, dry Dressing procedure/placement/frequency:I will provide Nursing with guidance for the care of this chronic ulcer using a twice daily betadine swabstick. Heels will be placed into Prevalon Boots for pressure injury prevention and a sacral dressing for PI prophylaxis will be placed.             DVT prophylaxis: Lovenox Code Status: Full Family Communication: 1/25 spoke to SFreda Munro(daughter) explained plan of care answered all questions Status is: Inpatient    Dispo: The patient is from: Home              Anticipated d/c is to: Home              Anticipated d/c date is: 1/30              Patient currently unstable      Consultants:  Cardiology Dr. JCarlyle Dolly Procedures/Significant Events:    I have personally reviewed and interpreted all radiology studies and my findings are as above.  VENTILATOR SETTINGS: HFNC 1/25 Flow 15 L/min SPO2 95%   Cultures   Antimicrobials: Anti-infectives (From admission, onward)   Start     Ordered Stop   12/18/20 1000  remdesivir 100 mg in sodium chloride 0.9 % 100 mL IVPB       "Followed by" Linked Group Details   12/17/20  2121 12/22/20 0959   12/17/20 2230  remdesivir 200 mg in sodium chloride 0.9% 250 mL IVPB       "Followed by" Linked Group Details   12/17/20 2121 12/18/20 0710   12/17/20 1945  cefTRIAXone (ROCEPHIN) 1 g in sodium chloride 0.9 % 100 mL IVPB        12/17/20 1940 12/17/20 2252   12/17/20 1945  azithromycin (ZITHROMAX) 500 mg in sodium chloride 0.9 % 250 mL IVPB        12/17/20 1940 12/18/20 0711       Devices    LINES / TUBES:      Continuous Infusions: . sodium chloride 1,000 mL (12/18/20 1311)  . DOBUTamine 2.5 mcg/kg/min (12/20/20 0400)  . remdesivir 100 mg in NS 100 mL Stopped (12/19/20 1440)     Objective: Vitals:   12/20/20 0300 12/20/20 0354 12/20/20 0400 12/20/20 0500  BP: (!) 134/50  128/81 (!) 115/54  Pulse: 93  99 64  Resp: (!) 23  (!) 21 (!) 25  Temp:  (!) 97.1 F (36.2 C)  TempSrc:  Oral    SpO2: 93%  95% 90%  Weight:      Height:        Intake/Output Summary (Last 24 hours) at 12/20/2020 0831 Last data filed at 12/20/2020 0400 Gross per 24 hour  Intake 894.74 ml  Output 400 ml  Net 494.74 ml   Filed Weights   12/18/20 1300 12/18/20 1525  Weight: 69 kg 67.5 kg   Physical Exam:  General: A/O x1 (does not know where, when, why) positive acute respiratory distress Eyes: negative scleral hemorrhage, negative anisocoria, negative icterus ENT: Negative Runny nose, negative gingival bleeding, Neck:  Negative scars, masses, torticollis, lymphadenopathy, JVD Lungs: decreased breath sounds bilaterally without wheezes or crackles Cardiovascular: Regular rate and rhythm without murmur gallop or rub normal S1 and S2 Abdomen: negative abdominal pain, nondistended, positive soft, bowel sounds, no rebound, no ascites, no appreciable mass Extremities: Second toe ulceration on right foot.  Chronic, non healing ulcer Skin: Negative rashes, lesions, ulcers Psychiatric:  Negative depression, negative anxiety, negative fatigue, negative mania  Central nervous  system:  Cranial nerves II through XII intact, tongue/uvula midline, all extremities muscle strength 5/5, sensation intact throughout,negative dysarthria, negative expressive aphasia, negative receptive aphasia. Physical Exam:  General: A/O x2 (does not know when, why) positive acute respiratory distress Eyes: negative scleral hemorrhage, negative anisocoria, negative icterus ENT: Negative Runny nose, negative gingival bleeding, Neck:  Negative scars, masses, torticollis, lymphadenopathy, JVD Lungs: decreased breath sounds bilaterally without wheezes or crackles Cardiovascular: Regular rate and rhythm without murmur gallop or rub normal S1 and S2 Abdomen: negative abdominal pain, nondistended, positive soft, bowel sounds, no rebound, no ascites, no appreciable mass Extremities: No significant cyanosis, clubbing, or edema bilateral lower extremities Skin: Negative rashes, lesions, ulcers Psychiatric:  Negative depression, negative anxiety, negative fatigue, negative mania  Central nervous system:  Cranial nerves II through XII intact, tongue/uvula midline, all extremities muscle strength 5/5, sensation intact throughout, negative dysarthria, negative expressive aphasia, negative receptive aphasia.  .     Data Reviewed: Care during the described time interval was provided by me .  I have reviewed this patient's available data, including medical history, events of note, physical examination, and all test results as part of my evaluation.  CBC: Recent Labs  Lab 12/17/20 1951 12/19/20 0219 12/20/20 0259  WBC 5.5 6.5 8.3  NEUTROABS 4.4 5.4 7.5  HGB 10.4* 10.3* 9.4*  HCT 31.2* 31.1* 28.2*  MCV 88.1 88.1 87.0  PLT 155 168 [id number]   Basic Metabolic Panel: Recent Labs  Lab 12/17/20 1951 12/17/20 2326 12/18/20 0811 12/19/20 0219 12/20/20 0259  NA 136 138  --  139 140  K 3.9 4.0  --  3.8 4.0  CL 103 107  --  109 109  CO2 20* 18*  --  17* 15*  GLUCOSE 125* 114*  --  124* 131*  BUN 46*  44*  --  57* 67*  CREATININE 2.95* 2.68*  --  2.40* 2.18*  CALCIUM 8.1* 7.4*  --  8.1* 8.2*  MG  --   --  2.0 2.1 2.1  PHOS  --   --  5.0* 4.6 4.4   GFR: Estimated Creatinine Clearance: 19.6 mL/min (A) (by C-G formula based on SCr of 2.18 mg/dL (H)). Liver Function Tests: Recent Labs  Lab 12/17/20 1951 12/17/20 2326 12/19/20 0219 12/20/20 0259  AST 97* 90* 79* 58*  ALT 38 33 39 38  ALKPHOS 66 54 56 54  BILITOT 0.4 0.6 0.5 0.7  PROT 6.3*  5.5* 5.8* 5.5*  ALBUMIN 3.2* 2.6* 2.8* 2.7*   No results for input(s): LIPASE, AMYLASE in the last 168 hours. No results for input(s): AMMONIA in the last 168 hours. Coagulation Profile: No results for input(s): INR, PROTIME in the last 168 hours. Cardiac Enzymes: No results for input(s): CKTOTAL, CKMB, CKMBINDEX, TROPONINI in the last 168 hours. BNP (last 3 results) No results for input(s): PROBNP in the last 8760 hours. HbA1C: No results for input(s): HGBA1C in the last 72 hours. CBG: No results for input(s): GLUCAP in the last 168 hours. Lipid Profile: Recent Labs    12/17/20 1951  TRIG 93   Thyroid Function Tests: No results for input(s): TSH, T4TOTAL, FREET4, T3FREE, THYROIDAB in the last 72 hours. Anemia Panel: Recent Labs    12/19/20 0219 12/20/20 0259  FERRITIN 731* 712*   Sepsis Labs: Recent Labs  Lab 12/17/20 1951 12/18/20 1623 12/19/20 0219 12/20/20 0259  PROCALCITON 0.47 0.34 0.34 0.27  LATICACIDVEN 1.0  1.3 1.5  --   --     Recent Results (from the past 240 hour(s))  SARS Coronavirus 2 by RT PCR (hospital order, performed in [place] hospital lab) Nasopharyngeal Nasopharyngeal Swab     Status: Abnormal   Collection Time: 12/17/20  7:51 PM   Specimen: Nasopharyngeal Swab  Result Value Ref Range Status   SARS Coronavirus 2 POSITIVE (A) NEGATIVE Final    Comment: RESULT CALLED TO, READ BACK BY AND VERIFIED WITH: Montel Culver @ 2214 12/17/20 BY SJT (NOTE) SARS-CoV-2 target nucleic acids are  DETECTED  SARS-CoV-2 RNA is generally detectable in upper respiratory specimens  during the acute phase of infection.  Positive results are indicative  of the presence of the identified virus, but do not rule out bacterial infection or co-infection with other pathogens not detected by the test.  Clinical correlation with patient history and  other diagnostic information is necessary to determine patient infection status.  The expected result is negative.  Fact Sheet for Patients:   StrictlyIdeas.no   Fact Sheet for Healthcare Providers:   BankingDealers.co.za    This test is not yet approved or cleared by the [place] FDA and  has been authorized for detection and/or diagnosis of SARS-CoV-2 by FDA under an Emergency Use Authorization (EUA).  This EUA will remain in effect (meaning this  test can be used) for the duration of  the COVID-19 declaration under Section 564(b)(1) of the Act, 21 U.S.C. section 360-bbb-3(b)(1), unless the authorization is terminated or revoked sooner.  Performed at [facility], [place] [address], [place], [place] [address]   Blood Culture (routine x 2)     Status: None (Preliminary result)   Collection Time: 12/17/20  7:51 PM   Specimen: Right Antecubital; Blood  Result Value Ref Range Status   Specimen Description   Final    RIGHT ANTECUBITAL Performed at [place] [address], [place], [place] [address]    Special Requests   Final    BOTTLES DRAWN AEROBIC AND ANAEROBIC Blood Culture results may not be optimal due to an excessive volume of blood received in culture bottles Performed at [place] [address] [place], [place] [address]    Culture   Final    NO GROWTH 2 DAYS Performed at [place] [address], [place], [place] [address]    Report Status PENDING  Incomplete  Blood Culture (routine x 2)     Status: None  (Preliminary  result)   Collection Time: 12/17/20  7:51 PM   Specimen: Left Antecubital; Blood  Result Value Ref Range Status   Specimen Description   Final    LEFT ANTECUBITAL Performed at [place] [address] [place], [place] [address]    Special Requests   Final    BOTTLES DRAWN AEROBIC AND ANAEROBIC Blood Culture adequate volume Performed at [place] [address], [place], [place] [address]    Culture  Setup Time   Final    GRAM POSITIVE COCCI IN CLUSTERS AEROBIC BOTTLE ONLY CRITICAL RESULT CALLED TO, READ BACK BY AND VERIFIED WITH: Marc [name] [id number] 12/20/2020 Mena Goes Performed at [facility], [place] [address], [place], [place] [address]    Culture GRAM POSITIVE COCCI  Final   Report Status PENDING  Incomplete  Blood Culture ID Panel (Reflexed)     Status: Abnormal   Collection Time: 12/17/20  7:51 PM  Result Value Ref Range Status   Enterococcus faecalis NOT DETECTED NOT DETECTED Final   Enterococcus Faecium NOT DETECTED NOT DETECTED Final   Listeria monocytogenes NOT DETECTED NOT DETECTED Final   Staphylococcus species DETECTED (A) NOT DETECTED Final    Comment: CRITICAL RESULT CALLED TO, READ BACK BY AND VERIFIED WITH: J. WOFFORD,PHARMD 0620 12/20/2020 T. TYSOR    Staphylococcus aureus (BCID) NOT DETECTED NOT DETECTED Final   Staphylococcus epidermidis DETECTED (A) NOT DETECTED Final    Comment: Methicillin (oxacillin) resistant coagulase negative staphylococcus. Possible blood culture contaminant (unless isolated from more than one blood culture draw or clinical case suggests pathogenicity). No antibiotic treatment is indicated for blood  culture contaminants. CRITICAL RESULT CALLED TO, READ BACK BY AND VERIFIED WITH: J. WOFFORD,PHARMD 0620 12/20/2020 T. TYSOR    Staphylococcus lugdunensis NOT DETECTED NOT DETECTED Final   Streptococcus species NOT DETECTED NOT DETECTED Final   Streptococcus agalactiae NOT  DETECTED NOT DETECTED Final   Streptococcus pneumoniae NOT DETECTED NOT DETECTED Final   Streptococcus pyogenes NOT DETECTED NOT DETECTED Final   A.calcoaceticus-baumannii NOT DETECTED NOT DETECTED Final   Bacteroides fragilis NOT DETECTED NOT DETECTED Final   Enterobacterales NOT DETECTED NOT DETECTED Final   Enterobacter cloacae complex NOT DETECTED NOT DETECTED Final   Escherichia coli NOT DETECTED NOT DETECTED Final   Klebsiella aerogenes NOT DETECTED NOT DETECTED Final   Klebsiella oxytoca NOT DETECTED NOT DETECTED Final   Klebsiella pneumoniae NOT DETECTED NOT DETECTED Final   Proteus species NOT DETECTED NOT DETECTED Final   Salmonella species NOT DETECTED NOT DETECTED Final   Serratia marcescens NOT DETECTED NOT DETECTED Final   Haemophilus influenzae NOT DETECTED NOT DETECTED Final   Neisseria meningitidis NOT DETECTED NOT DETECTED Final   Pseudomonas aeruginosa NOT DETECTED NOT DETECTED Final   Stenotrophomonas maltophilia NOT DETECTED NOT DETECTED Final   Candida albicans NOT DETECTED NOT DETECTED Final   Candida auris NOT DETECTED NOT DETECTED Final   Candida glabrata NOT DETECTED NOT DETECTED Final   Candida krusei NOT DETECTED NOT DETECTED Final   Candida parapsilosis NOT DETECTED NOT DETECTED Final   Candida tropicalis NOT DETECTED NOT DETECTED Final   Cryptococcus neoformans/gattii NOT DETECTED NOT DETECTED Final   Methicillin resistance mecA/C DETECTED (A) NOT DETECTED Final    Comment: CRITICAL RESULT CALLED TO, READ BACK BY AND VERIFIED WITH: J. WOFFORD,PHARMD [id number] 12/20/2020 Mena Goes Performed at [facility], [address] [place], [place] [address]   MRSA PCR Screening     Status: None  Collection Time: 12/18/20  3:31 PM   Specimen: Nasal Mucosa; Nasopharyngeal  Result Value Ref Range Status   MRSA by PCR NEGATIVE NEGATIVE Final    Comment:        The GeneXpert MRSA Assay (FDA approved for NASAL specimens only), is one component of a comprehensive  MRSA colonization surveillance program. It is not intended to diagnose MRSA infection nor to guide or monitor treatment for MRSA infections. Performed at [facility], [place] [address] [place], [place] [address]          Radiology Studies: ECHOCARDIOGRAM LIMITED  Result Date: 12/19/2020    ECHOCARDIOGRAM LIMITED REPORT   Patient Name:   Marc [name] Date of Exam: 12/19/2020 Medical Rec #:  [id number]       Height:       64.5 in Accession #:    [id number]      Weight:       148.8 lb Date of Birth:  Jun 26, 1930       BSA:          1.735 m Patient Age:    15 years        BP:           114/42 mmHg Patient Gender: M               HR:           94 bpm. Exam Location:  Inpatient Procedure: Limited Echo and Limited Color Doppler Indications:    R94.31 Abnormal EKG  History:        Patient has prior history of Echocardiogram examinations, most                 recent 04/23/2017. COVID-19 Positive.  Sonographer:    Tiffany Dance Referring Phys: [id number] Marc [name] Comments: Technically difficult study due to poor echo windows, no apical window, suboptimal subcostal window, suboptimal parasternal window and Technically challenging study due to limited acoustic windows. IMPRESSIONS  1. Study is non-diagnostic as unable to visualize cardiac structures. Recommend different imaging modality (TEE vs MRI) if clinically indicated. FINDINGS  Left Ventricle: Study is non-diagnostic as unable to visualize cardiac structures. Recommend different imaging modality (TEE vs MRI) if clinically indicated. Gwyndolyn Kaufman MD Electronically signed by Gwyndolyn Kaufman MD Signature Date/Time: 12/19/2020/4:05:17 PM    Final         Scheduled Meds: . allopurinol  200 mg Oral q morning - 10a  . amLODipine  5 mg Oral q morning - 10a  . vitamin C  500 mg Oral Daily  . B-complex with vitamin C  1 tablet Oral Daily  . baricitinib  1 mg Oral Daily  . Chlorhexidine Gluconate Cloth  6 each  Topical Daily  . cholecalciferol  1,000 Units Oral q morning - 10a  . clopidogrel  75 mg Oral q morning - 10a  . enoxaparin (LOVENOX) injection  30 mg Subcutaneous Q24H  . escitalopram  20 mg Oral QHS  . famotidine  20 mg Oral q morning - 10a  . gabapentin  100 mg Oral QHS  . Ipratropium-Albuterol  1 puff Inhalation QID  . mouth rinse  15 mL Mouth Rinse BID  . methylPREDNISolone (SOLU-MEDROL) injection  60 mg Intravenous Q12H  . multivitamin with minerals  1 tablet Oral q morning - 10a  . rosuvastatin  20 mg Oral q morning - 10a  . sodium bicarbonate  650 mg Oral BID  . tamsulosin  0.4 mg Oral  QHS  . zinc sulfate  220 mg Oral Daily   Continuous Infusions: . sodium chloride 1,000 mL (12/18/20 1311)  . DOBUTamine 2.5 mcg/kg/min (12/20/20 0400)  . remdesivir 100 mg in NS 100 mL Stopped (12/19/20 1440)     LOS: 3 days    Time spent:40 min    Harvest Deist, Geraldo Docker, MD [facility] Pager [phone]  If 7PM-7AM, please contact night-coverage [email or web address] Password [facility] 12/20/2020, 8:31 AM

## 2020-12-20 NOTE — Progress Notes (Signed)
PHARMACY - PHYSICIAN COMMUNICATION CRITICAL VALUE ALERT - BLOOD CULTURE IDENTIFICATION (BCID)  Marc [name] is an 87 y.o. male who presented to [facility] on [facility]/17/2020 with a chief complaint of COVID pneumonia.  Assessment:  1 of 4 bottles MRSA, likely contaminant.  Name of physician (or Provider) Contacted: Dr. Sherral Hammers  Current antibiotics: none  Changes to prescribed antibiotics recommended:  No antibiotics needed, likely contaminant; continue to monitor for signs/syptoms of bacterial infection.   Results for orders placed or performed during the hospital encounter of 12/17/20  Blood Culture ID Panel (Reflexed) (Collected: 12/17/2020  7:51 PM)  Result Value Ref Range   Enterococcus faecalis NOT DETECTED NOT DETECTED   Enterococcus Faecium NOT DETECTED NOT DETECTED   Listeria monocytogenes NOT DETECTED NOT DETECTED   Staphylococcus species DETECTED (A) NOT DETECTED   Staphylococcus aureus (BCID) NOT DETECTED NOT DETECTED   Staphylococcus epidermidis DETECTED (A) NOT DETECTED   Staphylococcus lugdunensis NOT DETECTED NOT DETECTED   Streptococcus species NOT DETECTED NOT DETECTED   Streptococcus agalactiae NOT DETECTED NOT DETECTED   Streptococcus pneumoniae NOT DETECTED NOT DETECTED   Streptococcus pyogenes NOT DETECTED NOT DETECTED   A.calcoaceticus-baumannii NOT DETECTED NOT DETECTED   Bacteroides fragilis NOT DETECTED NOT DETECTED   Enterobacterales NOT DETECTED NOT DETECTED   Enterobacter cloacae complex NOT DETECTED NOT DETECTED   Escherichia coli NOT DETECTED NOT DETECTED   Klebsiella aerogenes NOT DETECTED NOT DETECTED   Klebsiella oxytoca NOT DETECTED NOT DETECTED   Klebsiella pneumoniae NOT DETECTED NOT DETECTED   Proteus species NOT DETECTED NOT DETECTED   Salmonella species NOT DETECTED NOT DETECTED   Serratia marcescens NOT DETECTED NOT DETECTED   Haemophilus influenzae NOT DETECTED NOT DETECTED   Neisseria meningitidis NOT DETECTED NOT DETECTED   Pseudomonas  aeruginosa NOT DETECTED NOT DETECTED   Stenotrophomonas maltophilia NOT DETECTED NOT DETECTED   Candida albicans NOT DETECTED NOT DETECTED   Candida auris NOT DETECTED NOT DETECTED   Candida glabrata NOT DETECTED NOT DETECTED   Candida krusei NOT DETECTED NOT DETECTED   Candida parapsilosis NOT DETECTED NOT DETECTED   Candida tropicalis NOT DETECTED NOT DETECTED   Cryptococcus neoformans/gattii NOT DETECTED NOT DETECTED   Methicillin resistance mecA/C DETECTED (A) NOT DETECTED    Peggyann Juba, PharmD, BCPS Pharmacy: [phone] 12/20/2020  7:57 AM

## 2020-12-20 NOTE — Progress Notes (Signed)
Assisted tele visit to patient with daughter.  Sedonia Small, RN

## 2020-12-20 NOTE — Progress Notes (Signed)
Patients daughter Theda Belfast [phone] would like to be called and updated at least once a day by doctor about her fathers condition.

## 2020-12-21 ENCOUNTER — Inpatient Hospital Stay (HOSPITAL_COMMUNITY): Payer: Medicare Other

## 2020-12-21 ENCOUNTER — Encounter (HOSPITAL_COMMUNITY): Payer: Medicare Other

## 2020-12-21 DIAGNOSIS — R609 Edema, unspecified: Secondary | ICD-10-CM | POA: Diagnosis not present

## 2020-12-21 DIAGNOSIS — G9341 Metabolic encephalopathy: Secondary | ICD-10-CM | POA: Diagnosis not present

## 2020-12-21 DIAGNOSIS — R7989 Other specified abnormal findings of blood chemistry: Secondary | ICD-10-CM | POA: Diagnosis not present

## 2020-12-21 DIAGNOSIS — U071 COVID-19: Secondary | ICD-10-CM | POA: Diagnosis not present

## 2020-12-21 DIAGNOSIS — I714 Abdominal aortic aneurysm, without rupture: Secondary | ICD-10-CM | POA: Diagnosis not present

## 2020-12-21 DIAGNOSIS — J9601 Acute respiratory failure with hypoxia: Secondary | ICD-10-CM | POA: Diagnosis not present

## 2020-12-21 LAB — COMPREHENSIVE METABOLIC PANEL
ALT: 37 U/L (ref 0–44)
AST: 57 U/L — ABNORMAL HIGH (ref 15–41)
Albumin: 2.8 g/dL — ABNORMAL LOW (ref 3.5–5.0)
Alkaline Phosphatase: 60 U/L (ref 38–126)
Anion gap: 12 (ref 5–15)
BUN: 66 mg/dL — ABNORMAL HIGH (ref 8–23)
CO2: 20 mmol/L — ABNORMAL LOW (ref 22–32)
Calcium: 8.1 mg/dL — ABNORMAL LOW (ref 8.9–10.3)
Chloride: 106 mmol/L (ref 98–111)
Creatinine, Ser: 2.67 mg/dL — ABNORMAL HIGH (ref 0.61–1.24)
GFR, Estimated: 22 mL/min — ABNORMAL LOW (ref >60)
Glucose, Bld: 191 mg/dL — ABNORMAL HIGH (ref 70–99)
Potassium: 4 mmol/L (ref 3.5–5.1)
Sodium: 138 mmol/L (ref 135–145)
Total Bilirubin: 0.5 mg/dL (ref 0.3–1.2)
Total Protein: 5.6 g/dL — ABNORMAL LOW (ref 6.5–8.1)

## 2020-12-21 LAB — CBC WITH DIFFERENTIAL/PLATELET
Abs Immature Granulocytes: 0.05 10*3/uL (ref 0.00–0.07)
Basophils Absolute: 0 10*3/uL (ref 0.0–0.1)
Basophils Relative: 0 %
Eosinophils Absolute: 0 10*3/uL (ref 0.0–0.5)
Eosinophils Relative: 0 %
HCT: 27.8 % — ABNORMAL LOW (ref 39.0–52.0)
Hemoglobin: 9.3 g/dL — ABNORMAL LOW (ref 13.0–17.0)
Immature Granulocytes: 1 %
Lymphocytes Relative: 4 %
Lymphs Abs: 0.4 10*3/uL — ABNORMAL LOW (ref 0.7–4.0)
MCH: 28.9 pg (ref 26.0–34.0)
MCHC: 33.5 g/dL (ref 30.0–36.0)
MCV: 86.3 fL (ref 80.0–100.0)
Monocytes Absolute: 0.2 10*3/uL (ref 0.1–1.0)
Monocytes Relative: 2 %
Neutro Abs: 8.1 10*3/uL — ABNORMAL HIGH (ref 1.7–7.7)
Neutrophils Relative %: 93 %
Platelets: 155 10*3/uL (ref 150–400)
RBC: 3.22 MIL/uL — ABNORMAL LOW (ref 4.22–5.81)
RDW: 14.2 % (ref 11.5–15.5)
WBC: 8.7 10*3/uL (ref 4.0–10.5)
nRBC: 0 % (ref 0.0–0.2)

## 2020-12-21 LAB — CULTURE, BLOOD (ROUTINE X 2): Special Requests: ADEQUATE

## 2020-12-21 LAB — D-DIMER, QUANTITATIVE: D-Dimer, Quant: 5.32 ug/mL-FEU — ABNORMAL HIGH (ref 0.00–0.50)

## 2020-12-21 LAB — C-REACTIVE PROTEIN: CRP: 4.4 mg/dL — ABNORMAL HIGH (ref <1.0)

## 2020-12-21 LAB — PHOSPHORUS: Phosphorus: 4.1 mg/dL (ref 2.5–4.6)

## 2020-12-21 LAB — HEPARIN LEVEL (UNFRACTIONATED): Heparin Unfractionated: 1.04 IU/mL — ABNORMAL HIGH (ref 0.30–0.70)

## 2020-12-21 LAB — FERRITIN: Ferritin: 733 ng/mL — ABNORMAL HIGH (ref 24–336)

## 2020-12-21 LAB — MAGNESIUM: Magnesium: 2.3 mg/dL (ref 1.7–2.4)

## 2020-12-21 MED ORDER — HEPARIN BOLUS VIA INFUSION
1000.0000 [IU] | Freq: Once | INTRAVENOUS | Status: AC
  Administered 2020-12-21: 1000 [IU] via INTRAVENOUS
  Filled 2020-12-21: qty 1000

## 2020-12-21 MED ORDER — HEPARIN (PORCINE) 25000 UT/250ML-% IV SOLN
1000.0000 [IU]/h | INTRAVENOUS | Status: DC
  Administered 2020-12-21: 1000 [IU]/h via INTRAVENOUS
  Filled 2020-12-21: qty 250

## 2020-12-21 MED ORDER — GUAIFENESIN-DM 100-10 MG/5ML PO SYRP: 5.0000 mL | ORAL_SOLUTION | ORAL | Status: DC | PRN

## 2020-12-21 MED ORDER — HEPARIN (PORCINE) 25000 UT/250ML-% IV SOLN
800.0000 [IU]/h | INTRAVENOUS | Status: DC
  Administered 2020-12-21: 800 [IU]/h via INTRAVENOUS

## 2020-12-21 NOTE — Progress Notes (Signed)
12/21/2020 Patient has refused PO meds this evening after multiple attempts. Refusals documented on [facility] and provider notified via page [facility] APP/TRH [phone]). Cindy S. Brigitte Pulse BSN, RN, [place] 12/21/2020 2:04 AM

## 2020-12-21 NOTE — Plan of Care (Signed)
  Problem: Safety: Goal: Non-violent Restraint(s) Outcome: Progressing   Problem: Education: Goal: Knowledge of General Education information will improve Description: Including pain rating scale, medication(s)/side effects and non-pharmacologic comfort measures Outcome: Not Progressing   Problem: Health Behavior/Discharge Planning: Goal: Ability to manage health-related needs will improve Outcome: Not Progressing   Problem: Clinical Measurements: Goal: Will remain free from infection Outcome: Progressing Goal: Diagnostic test results will improve Outcome: Progressing Goal: Respiratory complications will improve Outcome: Progressing Goal: Cardiovascular complication will be avoided Outcome: Progressing   Problem: Activity: Goal: Risk for activity intolerance will decrease Outcome: Not Progressing   Problem: Nutrition: Goal: Adequate nutrition will be maintained Outcome: Not Progressing   Problem: Coping: Goal: Level of anxiety will decrease Outcome: Not Progressing   Problem: Elimination: Goal: Will not experience complications related to bowel motility Outcome: Progressing Goal: Will not experience complications related to urinary retention Outcome: Progressing   Problem: Pain Managment: Goal: General experience of comfort will improve Outcome: Progressing   Problem: Safety: Goal: Ability to remain free from injury will improve Outcome: Progressing   Problem: Skin Integrity: Goal: Risk for impaired skin integrity will decrease Outcome: Progressing

## 2020-12-21 NOTE — Progress Notes (Signed)
Assisted tele visit to patient with family member.  Yann Biehn Anderson, RN   

## 2020-12-21 NOTE — Progress Notes (Signed)
Bilateral lower extremity venous duplex has been completed. Preliminary results can be found in CV Proc through chart review.  Results were given to the patient's nurse, Lani.  12/21/20 12:42 PM Marc [name] RVT

## 2020-12-21 NOTE — Progress Notes (Signed)
ANTICOAGULATION CONSULT NOTE - Initial Consult  Pharmacy Consult for Heparin Indication: Rule out VTE  No Known Allergies  Patient Measurements: Height: 5' 4.5" (163.8 cm) Weight: 67.5 kg (148 lb 13 oz) IBW/kg (Calculated) : 60.35 Heparin Dosing Weight: actual weight  Vital Signs: Temp: 98 F (36.7 C) (01/26 0800) Temp Source: Axillary (01/26 0800) BP: 130/87 (01/26 0800) Pulse Rate: 77 (01/26 0800)  Labs: Recent Labs    12/18/20 1212 12/18/20 1623 12/19/20 0219 12/19/20 0219 12/20/20 0259 12/21/20 0305  HGB  --   --  10.3*   < > 9.4* 9.3*  HCT  --   --  31.1*  --  28.2* 27.8*  PLT  --   --  168  --  169 155  CREATININE  --   --  2.40*  --  2.18* 2.67*  TROPONINIHS 57* 44*  --   --   --   --    < > = values in this interval not displayed.    Estimated Creatinine Clearance: 16 mL/min (A) (by C-G formula based on SCr of 2.67 mg/dL (H)).   Medical History: Past Medical History:  Diagnosis Date  . AAA (abdominal aortic aneurysm) ([place])   . AAA (abdominal aortic aneurysm) ([place])   . Arthritis   . CKD (chronic kidney disease)    CKD III-IV (Dr. Posey Pronto, [facility])  . Coronary artery disease    s/p CABG Dr. Servando Snare, Dr. Acie Fredrickson  . Falls frequently   . Gall stones    patiet unsure  . GERD (gastroesophageal reflux disease)   . Gout   . History of Clostridium difficile infection   . History of kidney stones   . History of ulcerative colitis   . Hyperlipidemia   . Hypertension   . Kidney stone on left side    has not passed as of 09/23/12  . Stroke [facility]) 2012   affecting RUE    Medications:  Scheduled:  . allopurinol  200 mg Oral q morning - 10a  . amLODipine  5 mg Oral q morning - 10a  . vitamin C  500 mg Oral Daily  . B-complex with vitamin C  1 tablet Oral Daily  . baricitinib  1 mg Oral Daily  . Chlorhexidine Gluconate Cloth  6 each Topical Daily  . cholecalciferol  1,000 Units Oral q morning - 10a  . clopidogrel  75 mg Oral q morning - 10a  .  escitalopram  20 mg Oral QHS  . famotidine  20 mg Oral q morning - 10a  . gabapentin  100 mg Oral QHS  . Ipratropium-Albuterol  1 puff Inhalation QID  . mouth rinse  15 mL Mouth Rinse BID  . methylPREDNISolone (SOLU-MEDROL) injection  60 mg Intravenous Q12H  . multivitamin with minerals  1 tablet Oral q morning - 10a  . rosuvastatin  20 mg Oral q morning - 10a  . sodium bicarbonate  650 mg Oral BID  . tamsulosin  0.4 mg Oral QHS  . zinc sulfate  220 mg Oral Daily   Infusions:  . sodium chloride 10 mL/hr at 12/20/20 2000  . dextrose 50 mL/hr at 12/21/20 0857  . remdesivir 100 mg in NS 100 mL Stopped (12/20/20 1006)   PRN: acetaminophen, guaiFENesin-dextromethorphan  Assessment: 87 yo male admitted with acute hypoxemic respiratory failure due to COVID-19.  Has been receiving lovenox VTE prophylaxis, now with rising d-dimer so pharmacy consulted to dose IV heparin for r/o DVT/PE. LE dopplers pending.   CBC:  Hgb low 9.3, Plts wnl  D-dimer increased 5.32  SCr increased 2.67  No bleeding reported  Goal of Therapy:  Heparin level 0.3-0.7 units/ml Monitor platelets by anticoagulation protocol: Yes   Plan:  Heparin 1000 units IV bolus Heparin IV infusion 1000 units/hr  Peggyann Juba, PharmD, BCPS Pharmacy: [phone] 12/21/2020,10:40 AM

## 2020-12-21 NOTE — Plan of Care (Signed)
Pt refusing oral medications. MD notified.

## 2020-12-21 NOTE — Progress Notes (Signed)
PROGRESS NOTE    SNEHAL BINKS  [id number] DOB: 05-13-1930 DOA: 12/17/2020 PCP: Leonard Downing, MD    Brief Narrative:  Mr. Marc [name] was admitted to the hospital with a working diagnosis of acute hypoxic respiratory failure due to SARS COVID-19 viral pneumonia.  87 year old male with past medical history for ischemic cardiomyopathy status post CABG, history of CVA chronic kidney disease stage IV who presented with dyspnea.  Patient had several days of generalized weakness and dyspnea, no fevers no chills.  Her shortness of breath was progressive and worsening to the point where he called EMS.  On his initial physical examination his oximetry was 70% on room air, his blood pressure 131/50, heart rate 69, respiratory 24, oxygenation 99% on supplemental oxygen, 40 L.  His lungs were clear to auscultation bilaterally, heart S1-S2, present rhythmic, soft abdomen, no extremity edema. SARS COVID-19 positive.  Chest radiograph with dense bilateral lower lobe infiltrates. EKG 67 bpm, left axis deviation, QTC 544, sinus rhythm, low voltage, no significant ST segment or T wave changes.  Assessment & Plan:   Principal Problem:   Acute hypoxemic respiratory failure due to COVID-19 [facility]) Active Problems:   Coronary artery disease   History of stroke without residual deficits   AAA (abdominal aortic aneurysm) (HCC)   Essential hypertension   CKD (chronic kidney disease) stage 4, GFR 15-29 ml/min (HCC)   Acute metabolic encephalopathy   Acute respiratory failure with hypoxia (HCC)   Pneumonia due to COVID-19 virus   CAP (community acquired pneumonia)   Severe sinus bradycardia   QT prolongation   CKD (chronic kidney disease), stage IV ([place])   1.  Acute hypoxic respiratory failure due to SARS COVID-19 viral pneumonia.  RR: 23  Pulse oxymetry: 94 to 96%  Fi02: 8 L/ min per [place]    COVID-19 Labs  Recent Labs    12/19/20 0219 12/20/20 0259 12/21/20 0305  DDIMER 3.30* 4.02*  5.32*  FERRITIN 731* 712* 733*  CRP 10.5* 6.5* 4.4*    Lab Results  Component Value Date   SARSCOV2NAA POSITIVE (A) 12/17/2020   Patient with very poor oral intake. Continue to have high inflammatory markers with worsening d dimer. He is on bilateral mittens.   Continue medical therapy with baricitinib and methylprednisolone 60 mg q 12hrs. Completed 5 doses or remdesivir.   Increase to therapeutic anticoagulation due to high d dimer. Check [place] lower extremities.  Continue with bronchodilator therapy, airway clearing techniques, and antitussive agents. PT/OT, out of bed to chair tid with meals. Consult nutrition.   Ruled out bacterial pneumonia, continue to hold on antibiotic therapy for now.   Ok to transfer to medical ward.   2. Bradycardia due to AV blockers. Prolonged Qtc. CAD Patient now off dobutamine, with HR 78 bpm. Continue telemetry monitoring, dc dobutamine.  Check EKG today, avoid qtc prolonging medications.   Continue with clopidogrel and rosuvastatin.   3. Acute metabolic encephalopathy with anorexia. Patient continue with poor oral intake, has mittens bilaterally.  Consult nutrition.   4. AKI on stage IV CKD with anion gap metabolic acidosis. Renal function with serum cr at 2,67 with K at 4,0 and bicarbonate at 20, anion gap is 12.  Patient with poor oral intake.  Hold for now on IV fluids in the setting acute viral pneumonia, follow up on renal function in am. Avoid hypotension and nephrotoxic medications.   5. OSA. Currently not using Cpap.   6. Right foot second toe ulceration. Present on admission, continue  with local wound care.   7. HTN. Blood pressure 130/87, continue with amlodipine as tolerated, patient is refusing po meds.   Patient continue to be at high risk for worsening respiratory failure   Status is: Inpatient  Remains inpatient appropriate because:Inpatient level of care appropriate due to severity of illness   Dispo: The patient is from:  Home              Anticipated d/c is to: Home              Anticipated d/c date is: 3 days              Patient currently is not medically stable to d/c.   Difficult to place patient No    DVT prophylaxis: Heparin    Code Status:   full  Family Communication:  I spoke over the phone with the patient's daughter about patient's  condition, plan of care, prognosis and all questions were addressed.     Subjective: Patient with very poor oral intake, has mittens bilaterally, no nausea or vomiting. Positive confusion.   Objective: Vitals:   12/21/20 0400 12/21/20 0600 12/21/20 0700 12/21/20 0800  BP: (!) 141/46 (!) 133/46 (!) 118/45 130/87  Pulse: 76 70 73 77  Resp: (!) '22 17 16 '$ (!) 23  Temp: 99 F (37.2 C)   98 F (36.7 C)  TempSrc: Axillary   Axillary  SpO2: 91% 97% 94% 96%  Weight:      Height:        Intake/Output Summary (Last 24 hours) at 12/21/2020 0922 Last data filed at 12/21/2020 0857 Gross per 24 hour  Intake 1478.94 ml  Output 350 ml  Net 1128.94 ml   Filed Weights   12/18/20 1300 12/18/20 1525  Weight: 69 kg 67.5 kg    Examination:   General: Not in pain, positive dyspnea, deconditioned  Neurology: Awake and alert, non focal, mild confusion, bilateral mittens in place.  E ENT: no pallor, no icterus, oral mucosa moist Cardiovascular: No JVD. S1-S2 present, rhythmic, no gallops, rubs, or murmurs. No lower extremity edema. Pulmonary: positive breath sounds bilaterally, poor inspiratory effort with no wheezing, rhonchi or rales. Gastrointestinal. Abdomen soft and non tender Skin. Right foot 2nd toe ulceration, dressing in place.   Musculoskeletal: no joint deformities/ bilateral boots in place.      Data Reviewed: I have personally reviewed following labs and imaging studies  CBC: Recent Labs  Lab 12/17/20 1951 12/19/20 0219 12/20/20 0259 12/21/20 0305  WBC 5.5 6.5 8.3 8.7  NEUTROABS 4.4 5.4 7.5 8.1*  HGB 10.4* 10.3* 9.4* 9.3*  HCT 31.2* 31.1*  28.2* 27.8*  MCV 88.1 88.1 87.0 86.3  PLT 155 168 169 [id number]   Basic Metabolic Panel: Recent Labs  Lab 12/17/20 1951 12/17/20 2326 12/18/20 0811 12/19/20 0219 12/20/20 0259 12/21/20 0305  NA 136 138  --  139 140 138  K 3.9 4.0  --  3.8 4.0 4.0  CL 103 107  --  109 109 106  CO2 20* 18*  --  17* 15* 20*  GLUCOSE 125* 114*  --  124* 131* 191*  BUN 46* 44*  --  57* 67* 66*  CREATININE 2.95* 2.68*  --  2.40* 2.18* 2.67*  CALCIUM 8.1* 7.4*  --  8.1* 8.2* 8.1*  MG  --   --  2.0 2.1 2.1 2.3  PHOS  --   --  5.0* 4.6 4.4 4.1   GFR: Estimated Creatinine Clearance: 16 mL/min (A) (  by C-G formula based on SCr of 2.67 mg/dL (H)). Liver Function Tests: Recent Labs  Lab 12/17/20 1951 12/17/20 2326 12/19/20 0219 12/20/20 0259 12/21/20 0305  AST 97* 90* 79* 58* 57*  ALT 38 33 39 38 37  ALKPHOS 66 54 56 54 60  BILITOT 0.4 0.6 0.5 0.7 0.5  PROT 6.3* 5.5* 5.8* 5.5* 5.6*  ALBUMIN 3.2* 2.6* 2.8* 2.7* 2.8*   No results for input(s): LIPASE, AMYLASE in the last 168 hours. No results for input(s): AMMONIA in the last 168 hours. Coagulation Profile: No results for input(s): INR, PROTIME in the last 168 hours. Cardiac Enzymes: No results for input(s): CKTOTAL, CKMB, CKMBINDEX, TROPONINI in the last 168 hours. BNP (last 3 results) No results for input(s): PROBNP in the last 8760 hours. HbA1C: No results for input(s): HGBA1C in the last 72 hours. CBG: No results for input(s): GLUCAP in the last 168 hours. Lipid Profile: No results for input(s): CHOL, HDL, LDLCALC, TRIG, CHOLHDL, LDLDIRECT in the last 72 hours. Thyroid Function Tests: No results for input(s): TSH, T4TOTAL, FREET4, T3FREE, THYROIDAB in the last 72 hours. Anemia Panel: Recent Labs    12/20/20 0259 12/21/20 0305  FERRITIN 712* 733*      Radiology Studies: I have reviewed all of the imaging during this hospital visit personally     Scheduled Meds: . allopurinol  200 mg Oral q morning - 10a  . amLODipine  5 mg Oral  q morning - 10a  . vitamin C  500 mg Oral Daily  . B-complex with vitamin C  1 tablet Oral Daily  . baricitinib  1 mg Oral Daily  . Chlorhexidine Gluconate Cloth  6 each Topical Daily  . cholecalciferol  1,000 Units Oral q morning - 10a  . clopidogrel  75 mg Oral q morning - 10a  . enoxaparin (LOVENOX) injection  30 mg Subcutaneous Q24H  . escitalopram  20 mg Oral QHS  . famotidine  20 mg Oral q morning - 10a  . gabapentin  100 mg Oral QHS  . Ipratropium-Albuterol  1 puff Inhalation QID  . mouth rinse  15 mL Mouth Rinse BID  . methylPREDNISolone (SOLU-MEDROL) injection  60 mg Intravenous Q12H  . multivitamin with minerals  1 tablet Oral q morning - 10a  . rosuvastatin  20 mg Oral q morning - 10a  . sodium bicarbonate  650 mg Oral BID  . tamsulosin  0.4 mg Oral QHS  . zinc sulfate  220 mg Oral Daily   Continuous Infusions: . sodium chloride 10 mL/hr at 12/20/20 2000  . dextrose 50 mL/hr at 12/21/20 0857  . DOBUTamine Stopped (12/20/20 0930)  . remdesivir 100 mg in NS 100 mL Stopped (12/20/20 1006)     LOS: 4 days        Shyam Dawson Gerome Apley, MD

## 2020-12-21 NOTE — Progress Notes (Signed)
ANTICOAGULATION CONSULT NOTE  Pharmacy Consult for Heparin Indication: Rule out VTE  No Known Allergies  Patient Measurements: Height: 5' 4.5" (163.8 cm) Weight: 67.5 kg (148 lb 13 oz) IBW/kg (Calculated) : 60.35 Heparin Dosing Weight: actual weight  Vital Signs: Temp: 97.6 F (36.4 C) (01/26 1958) Temp Source: Oral (01/26 1958) BP: 143/56 (01/26 1900) Pulse Rate: 79 (01/26 1900)  Labs: Recent Labs    12/19/20 0219 12/20/20 0259 12/21/20 0305 12/21/20 2032  HGB 10.3* 9.4* 9.3*  --   HCT 31.1* 28.2* 27.8*  --   PLT 168 169 155  --   HEPARINUNFRC  --   --   --  1.04*  CREATININE 2.40* 2.18* 2.67*  --     Estimated Creatinine Clearance: 16 mL/min (A) (by C-G formula based on SCr of 2.67 mg/dL (H)).   Medical History: Past Medical History:  Diagnosis Date  . AAA (abdominal aortic aneurysm) ([place])   . AAA (abdominal aortic aneurysm) ([place])   . Arthritis   . CKD (chronic kidney disease)    CKD III-IV (Dr. Posey Pronto, [facility])  . Coronary artery disease    s/p CABG Dr. Servando Snare, Dr. Acie Fredrickson  . Falls frequently   . Gall stones    patiet unsure  . GERD (gastroesophageal reflux disease)   . Gout   . History of Clostridium difficile infection   . History of kidney stones   . History of ulcerative colitis   . Hyperlipidemia   . Hypertension   . Kidney stone on left side    has not passed as of 09/23/12  . Stroke [facility]) 2012   affecting RUE    Medications:  Scheduled:  . allopurinol  200 mg Oral q morning - 10a  . amLODipine  5 mg Oral q morning - 10a  . vitamin C  500 mg Oral Daily  . B-complex with vitamin C  1 tablet Oral Daily  . baricitinib  1 mg Oral Daily  . Chlorhexidine Gluconate Cloth  6 each Topical Daily  . cholecalciferol  1,000 Units Oral q morning - 10a  . clopidogrel  75 mg Oral q morning - 10a  . escitalopram  20 mg Oral QHS  . famotidine  20 mg Oral q morning - 10a  . gabapentin  100 mg Oral QHS  . Ipratropium-Albuterol  1 puff  Inhalation QID  . mouth rinse  15 mL Mouth Rinse BID  . methylPREDNISolone (SOLU-MEDROL) injection  60 mg Intravenous Q12H  . multivitamin with minerals  1 tablet Oral q morning - 10a  . rosuvastatin  20 mg Oral q morning - 10a  . sodium bicarbonate  650 mg Oral BID  . tamsulosin  0.4 mg Oral QHS  . zinc sulfate  220 mg Oral Daily   Infusions:  . sodium chloride 10 mL/hr at 12/20/20 2000  . dextrose Stopped (12/21/20 1106)  . heparin     PRN: acetaminophen, guaiFENesin-dextromethorphan  Assessment: 87 yo male admitted with acute hypoxemic respiratory failure due to COVID-19.  Has been receiving lovenox VTE prophylaxis, now with rising d-dimer so pharmacy consulted to dose IV heparin for r/o DVT/PE. LE dopplers pending.  12/21/20 9:21 PM   HL above goal at 1.04   No bleeding reported  Goal of Therapy:  Heparin level 0.3-0.7 units/ml Monitor platelets by anticoagulation protocol: Yes   Plan:  Pause heparin infusion x 1 hour then resume at 800 units/hr Recheck HL in 8 hours Daily HL/CBC  Monitor for s/s of bleeding  Ulice Dash D  12/21/2020,9:18 PM

## 2020-12-21 NOTE — Progress Notes (Signed)
Assisted tele visit to patient with daughter.  Margaret Pyle, RN

## 2020-12-21 NOTE — TOC Progression Note (Signed)
Transition of Care [facility]) - Progression Note    Patient Details  Name: Marc [name] MRN: [id number] Date of Birth: 03-19-1930  [facility]) CM/SW Contact  Leeroy Cha, RN Phone Number: 12/21/2020, 8:48 AM  Clinical Narrative:    covid + Nasal canal at 8l/min, iv d5w, iv doubutamine, remdesivir through [place]. PLan FRom home will return to home with possible home o2 Progression following  Expected Discharge Plan: Home/Self Care Barriers to Discharge: Continued Medical Work up  Expected Discharge Plan and Services Expected Discharge Plan: Home/Self Care   Discharge Planning Services: CM Consult   Living arrangements for the past 2 months: Single Family Home                                       Social Determinants of Health (SDOH) Interventions    Readmission Risk Interventions No flowsheet data found.

## 2020-12-22 DIAGNOSIS — U071 COVID-19: Secondary | ICD-10-CM | POA: Diagnosis not present

## 2020-12-22 DIAGNOSIS — I714 Abdominal aortic aneurysm, without rupture: Secondary | ICD-10-CM | POA: Diagnosis not present

## 2020-12-22 DIAGNOSIS — G9341 Metabolic encephalopathy: Secondary | ICD-10-CM | POA: Diagnosis not present

## 2020-12-22 DIAGNOSIS — J9601 Acute respiratory failure with hypoxia: Secondary | ICD-10-CM | POA: Diagnosis not present

## 2020-12-22 LAB — HEPARIN LEVEL (UNFRACTIONATED)
Heparin Unfractionated: 0.97 IU/mL — ABNORMAL HIGH (ref 0.30–0.70)
Heparin Unfractionated: 0.64 IU/mL (ref 0.30–0.70)

## 2020-12-22 LAB — CBC
HCT: 28 % — ABNORMAL LOW (ref 39.0–52.0)
Hemoglobin: 9.2 g/dL — ABNORMAL LOW (ref 13.0–17.0)
MCH: 29.2 pg (ref 26.0–34.0)
MCHC: 32.9 g/dL (ref 30.0–36.0)
MCV: 88.9 fL (ref 80.0–100.0)
Platelets: 169 10*3/uL (ref 150–400)
RBC: 3.15 MIL/uL — ABNORMAL LOW (ref 4.22–5.81)
RDW: 14.1 % (ref 11.5–15.5)
WBC: 8.8 10*3/uL (ref 4.0–10.5)
nRBC: 0 % (ref 0.0–0.2)

## 2020-12-22 LAB — CULTURE, BLOOD (ROUTINE X 2): Culture: NO GROWTH

## 2020-12-22 LAB — COMPREHENSIVE METABOLIC PANEL
ALT: 34 U/L (ref 0–44)
AST: 50 U/L — ABNORMAL HIGH (ref 15–41)
Albumin: 2.8 g/dL — ABNORMAL LOW (ref 3.5–5.0)
Alkaline Phosphatase: 62 U/L (ref 38–126)
Anion gap: 13 (ref 5–15)
BUN: 60 mg/dL — ABNORMAL HIGH (ref 8–23)
CO2: 17 mmol/L — ABNORMAL LOW (ref 22–32)
Calcium: 8 mg/dL — ABNORMAL LOW (ref 8.9–10.3)
Chloride: 107 mmol/L (ref 98–111)
Creatinine, Ser: 1.97 mg/dL — ABNORMAL HIGH (ref 0.61–1.24)
GFR, Estimated: 32 mL/min — ABNORMAL LOW (ref >60)
Glucose, Bld: 205 mg/dL — ABNORMAL HIGH (ref 70–99)
Potassium: 3.4 mmol/L — ABNORMAL LOW (ref 3.5–5.1)
Sodium: 137 mmol/L (ref 135–145)
Total Bilirubin: 0.7 mg/dL (ref 0.3–1.2)
Total Protein: 5.7 g/dL — ABNORMAL LOW (ref 6.5–8.1)

## 2020-12-22 LAB — FERRITIN: Ferritin: 860 ng/mL — ABNORMAL HIGH (ref 24–336)

## 2020-12-22 LAB — D-DIMER, QUANTITATIVE: D-Dimer, Quant: 5.9 ug/mL-FEU — ABNORMAL HIGH (ref 0.00–0.50)

## 2020-12-22 LAB — C-REACTIVE PROTEIN: CRP: 2.8 mg/dL — ABNORMAL HIGH (ref <1.0)

## 2020-12-22 MED ORDER — ENSURE ENLIVE PO LIQD
237.0000 mL | Freq: Two times a day (BID) | ORAL | Status: DC
  Administered 2020-12-22 – 2020-12-27 (×8): 237 mL via ORAL

## 2020-12-22 MED ORDER — HEPARIN (PORCINE) 25000 UT/250ML-% IV SOLN
650.0000 [IU]/h | INTRAVENOUS | Status: DC
  Filled 2020-12-22: qty 250

## 2020-12-22 MED ORDER — IPRATROPIUM-ALBUTEROL 20-100 MCG/ACT IN AERS: 1.0000 | INHALATION_SPRAY | Freq: Four times a day (QID) | RESPIRATORY_TRACT | Status: DC | PRN

## 2020-12-22 MED ORDER — METHYLPREDNISOLONE SODIUM SUCC 125 MG IJ SOLR
60.0000 mg | Freq: Every day | INTRAMUSCULAR | Status: DC
  Administered 2020-12-23 – 2020-12-24 (×2): 60 mg via INTRAVENOUS
  Filled 2020-12-22 (×2): qty 2

## 2020-12-22 MED ORDER — IPRATROPIUM-ALBUTEROL 20-100 MCG/ACT IN AERS
1.0000 | INHALATION_SPRAY | Freq: Two times a day (BID) | RESPIRATORY_TRACT | Status: DC
  Administered 2020-12-23 – 2020-12-27 (×9): 1 via RESPIRATORY_TRACT

## 2020-12-22 NOTE — Plan of Care (Signed)

## 2020-12-22 NOTE — Progress Notes (Signed)
Assisted tele visit to patient with daughter.  [place] Withey Anderson, RN   

## 2020-12-22 NOTE — Progress Notes (Signed)
Physical Therapy Treatment Patient Details Name: Marc [name] MRN: [id number] DOB: 10-Aug-1930 Today's Date: 12/22/2020    History of Present Illness 87 y.o. male with PMH of CKD 4, CAD s/p CABG, prior stroke. pt with several day h/o weakness and SOB, tested + for COVID    PT Comments     Patient is confused, oriented to self and month only, following 1 step directions inconsistently/ with increased time. Per chart states patient lives with his nephew and ambulates with a rolling walker. Pt currently requiring significant assist for all mobility tasks and ltd by SOB with minimal exertion. Recommend continued acute PT services to maximize patient endurance, safety awareness, balance in order to facilitate D/C to venue listed below.  Follow Up Recommendations  SNF     Equipment Recommendations  None recommended by PT    Recommendations for Other Services       Precautions / Restrictions Precautions Precautions: Fall Precaution Comments: monitor sats, currently on 7L HFNC Restrictions Weight Bearing Restrictions: No    Mobility  Bed Mobility Overal bed mobility: Needs Assistance Bed Mobility: Supine to Sit     Supine to sit: Mod assist;HOB elevated     General bed mobility comments: to assist with LEs and to elevate trunk to sitting  Transfers Overall transfer level: Needs assistance Equipment used: Rolling walker (2 wheeled) Transfers: Sit to/from Stand Sit to Stand: +2 physical assistance;+2 safety/equipment;Min assist;Mod assist         General transfer comment: cues for LE management and use of UEs to self assist.  Physical assist to bring wt up and fwd and to balance in standing  Ambulation/Gait Ambulation/Gait assistance: Min assist;+2 physical assistance;+2 safety/equipment Gait Distance (Feet): 3 Feet Assistive device: Rolling walker (2 wheeled) Gait Pattern/deviations: Step-to pattern;Decreased step length - right;Decreased step length -  left;Shuffle;Trunk flexed Gait velocity: decr   General Gait Details: cues for posture and position from RW.  Physical assist for balance/support and RW management.  Distance ltd by fatigue/SOB   Marine scientist [place] (Stroke Patients Only)       Balance Overall balance assessment: Needs assistance Sitting-balance support: Feet supported Sitting balance-Leahy Scale: Fair     Standing balance support: Bilateral upper extremity supported Standing balance-Leahy Scale: Poor Standing balance comment: reliant on UE support and external assistance                            Cognition Arousal/Alertness: Awake/alert Behavior During Therapy: Flat affect Overall Cognitive Status: No family/caregiver present to determine baseline cognitive functioning Area of Impairment: Orientation;Following commands;Safety/judgement;Problem solving                 Orientation Level: Disoriented to;Place;Time;Situation     Following Commands: Follows one step commands with increased time Safety/Judgement: Decreased awareness of deficits;Decreased awareness of safety   Problem Solving: Slow processing;Requires verbal cues;Requires tactile cues General Comments: patient cooperative but confused, oriented to self and month only. when asked why he's in the hospital patient responds "for me"      Exercises      General Comments General comments (skin integrity, edema, etc.): upon arrival to patient's room had O2 out of nose and readings in low to mid 80s, donned and kept on throughout session with desat to high 80s and able to recover quickly in sitting to low 90s on 7L  Pertinent Vitals/Pain Pain Assessment: Faces Faces Pain Scale: Hurts a little bit Pain Location: generalized Pain Descriptors / Indicators: Moaning Pain Intervention(s): Limited activity within patient's tolerance;Monitored during session    Home Living  Family/patient expects to be discharged to:: Skilled nursing facility Living Arrangements: Other relatives             Additional Comments: patient unable to provide accurate history, confused    Prior Function Level of Independence: Independent with assistive device(s)      Comments: From chart   PT Goals (current goals can now be found in the care plan section) Acute Rehab PT Goals Patient Stated Goal: "water" PT Goal Formulation: With patient Time For Goal Achievement: 01/05/21 Potential to Achieve Goals: Good    Frequency    Min 3X/week      PT Plan      Co-evaluation PT/OT/SLP Co-Evaluation/Treatment: Yes Reason for Co-Treatment: For patient/therapist safety PT goals addressed during session: Mobility/safety with mobility OT goals addressed during session: ADL's and self-care      AM-PAC PT "6 Clicks" Mobility   Outcome Measure  Help needed turning from your back to your side while in a flat bed without using bedrails?: A Lot Help needed moving from lying on your back to sitting on the side of a flat bed without using bedrails?: A Lot Help needed moving to and from a bed to a chair (including a wheelchair)?: A Lot Help needed standing up from a chair using your arms (e.g., wheelchair or bedside chair)?: A Lot Help needed to walk in hospital room?: A Lot Help needed climbing 3-5 steps with a railing? : Total 6 Click Score: 11    End of Session Equipment Utilized During Treatment: Gait belt;Oxygen Activity Tolerance: Patient limited by fatigue Patient left: in chair;with call bell/phone within reach;with restraints reapplied Nurse Communication: Mobility status PT Visit Diagnosis: Unsteadiness on feet (R26.81);Muscle weakness (generalized) (M62.81);Difficulty in walking, not elsewhere classified (R26.2)     Time: [id number] PT Time Calculation (min) (ACUTE ONLY): 38 min  Charges:                        Debe Coder PT [place] Pager [phone] Office [phone]    [place] 12/22/2020, 1:38 PM

## 2020-12-22 NOTE — Progress Notes (Signed)
PROGRESS NOTE    Marc [name]  [id number] DOB: 12-15-1929 DOA: 12/17/2020 PCP: Leonard Downing, MD    Brief Narrative:  Mr. Marc [name] was admitted to the hospital with a working diagnosis of acute hypoxic respiratory failure due to SARS COVID-19 viral pneumonia.  87 year old male with past medical history for ischemic cardiomyopathy status post CABG, history of CVA chronic kidney disease stage IV who presented with dyspnea.  Patient had several days of generalized weakness and dyspnea, no fevers no chills.  Her shortness of breath was progressive and worsening to the point where he called EMS.  On his initial physical examination his oximetry was 70% on room air, his blood pressure 131/50, heart rate 69, respiratory 24, oxygenation 99% on supplemental oxygen, 40 L.  His lungs were clear to auscultation bilaterally, heart S1-S2, present rhythmic, soft abdomen, no extremity edema. SARS COVID-19 positive.  Chest radiograph with dense bilateral lower lobe infiltrates. EKG 67 bpm, left axis deviation, QTC 544, sinus rhythm, low voltage, no significant ST segment or T wave changes.  Patient with very poor oral intake, has been confused and agitated. With aggressive medical therapy he has improved his oxygen saturation.  Assessment & Plan:   Principal Problem:   Acute hypoxemic respiratory failure due to COVID-19 [facility]) Active Problems:   Coronary artery disease   History of stroke without residual deficits   AAA (abdominal aortic aneurysm) (HCC)   Essential hypertension   CKD (chronic kidney disease) stage 4, GFR 15-29 ml/min (HCC)   Acute metabolic encephalopathy   Acute respiratory failure with hypoxia (HCC)   Pneumonia due to COVID-19 virus   CAP (community acquired pneumonia)   Severe sinus bradycardia   QT prolongation   CKD (chronic kidney disease), stage IV ([place])  Ruled out bacterial pneumonia/ sepsis, continue to hold on antibiotic therapy for now.   1.  Acute  hypoxic respiratory failure due to SARS COVID-19 viral pneumonia.  Sp remdesivir #5/5  RR: 15  Pulse oxymetry: 90%  Fi02: 5 L/min per [place]    COVID-19 Labs  Recent Labs    12/20/20 0259 12/21/20 0305 12/22/20 0626  DDIMER 4.02* 5.32* 5.90*  FERRITIN 712* 733* 860*  CRP 6.5* 4.4* 2.8*    Lab Results  Component Value Date   SARSCOV2NAA POSITIVE (A) 12/17/2020    Inflammatory markers continue to be elevated. His mentation has improved and now off upper extremity restrains. Continue to have poor oral intake.   [place] lower extremities negative for DVT.   Medical therapy with baricitinib and methylprednisolone, will decrease dose to 60 mg Iv q 24 hrs.  On bronchodilator therapy, airway clearing techniques, and antitussive agents. PT/OT, out of bed to chair tid with meals. Consult nutrition.   For now will continue full dose anticoagulation due to elevated D dimer.   Follow inflammatory markers and keep oxygen saturation 88% or greater. Patient will need SNF at discharge.    2. Bradycardia due to AV blockers. Prolonged Qtc. CAD  off dobutamine. Check EKG today to follow on intervals.   On clopidogrel and rosuvastatin.   3. Acute metabolic encephalopathy with anorexia/ depression. His mentation has improved, continue to encourage po intake.  Only on waist restrain, out of bed to chair.  Continue with PT and OT, he will need SNF  On escitalopram.   4. AKI on stage IV CKD with anion gap metabolic acidosis. Improving renal function with serum cr down to 1,97 with K at 3,4 and bicarbonate at 17.  Continue  to hold on IV fluids, avoid hypotension and nephrotoxic medications.  Follow up on renal function in am.  Continue with oral sodium bicarbonate for now.   5. OSA. Currently not using Cpap.   6. Right foot second toe ulceration (present on admission). Local wound care.   7. HTN. Blood pressure stable, continue close monitoring. Off IV fluids. On amlodipine 5 mg  daily.     Status is: Inpatient  Remains inpatient appropriate because:IV treatments appropriate due to intensity of illness or inability to take PO   Dispo: The patient is from: Home              Anticipated d/c is to: SNF              Anticipated d/c date is: 3 days              Patient currently is not medically stable to d/c.   Difficult to place patient No   DVT prophylaxis: Heparin   Code Status:   full  Family Communication:     Subjective: Patient is out of bed to chair, dyspnea has been improving, decreased agitation, now off restrains upper extremities. Continue with poor oral intake.   Objective: Vitals:   12/21/20 2303 12/22/20 0354 12/22/20 0700 12/22/20 0800  BP: (!) 141/56 (!) 130/52 (!) 151/55 (!) 162/77  Pulse: 80 68 73 88  Resp: '15 17 15 18  '$ Temp: 97.8 F (36.6 C) 98.2 F (36.8 C)  97.6 F (36.4 C)  TempSrc: Oral Axillary  Oral  SpO2: 98% 92% 90% 90%  Weight:      Height:        Intake/Output Summary (Last 24 hours) at 12/22/2020 [id number] Last data filed at 12/22/2020 0400 Gross per 24 hour  Intake 517.31 ml  Output 400 ml  Net 117.31 ml   Filed Weights   12/18/20 1300 12/18/20 1525  Weight: 69 kg 67.5 kg    Examination:   General: Not in pain or dyspnea, deconditioned  Neurology: Awake and alert, non focal  E ENT: mild pallor, no icterus, oral mucosa moist Cardiovascular: No JVD. S1-S2 present, rhythmic, no gallops, rubs, or murmurs. No lower extremity edema. Pulmonary: positive breath sounds bilaterally, decreased inspiratory effort Gastrointestinal. Abdomen soft and non tender Skin. Right foot toe ulcerations. Dressing in place.  Musculoskeletal: no joint deformities     Data Reviewed: I have personally reviewed following labs and imaging studies  CBC: Recent Labs  Lab 12/17/20 1951 12/19/20 0219 12/20/20 0259 12/21/20 0305 12/22/20 0626  WBC 5.5 6.5 8.3 8.7 8.8  NEUTROABS 4.4 5.4 7.5 8.1*  --   HGB 10.4* 10.3* 9.4* 9.3*  9.2*  HCT 31.2* 31.1* 28.2* 27.8* 28.0*  MCV 88.1 88.1 87.0 86.3 88.9  PLT 155 168 169 155 [id number]   Basic Metabolic Panel: Recent Labs  Lab 12/17/20 2326 12/18/20 0811 12/19/20 0219 12/20/20 0259 12/21/20 0305 12/22/20 0626  NA 138  --  139 140 138 137  K 4.0  --  3.8 4.0 4.0 3.4*  CL 107  --  109 109 106 107  CO2 18*  --  17* 15* 20* 17*  GLUCOSE 114*  --  124* 131* 191* 205*  BUN 44*  --  57* 67* 66* 60*  CREATININE 2.68*  --  2.40* 2.18* 2.67* 1.97*  CALCIUM 7.4*  --  8.1* 8.2* 8.1* 8.0*  MG  --  2.0 2.1 2.1 2.3  --   PHOS  --  5.0* 4.6 4.4  4.1  --    GFR: Estimated Creatinine Clearance: 21.7 mL/min (A) (by C-G formula based on SCr of 1.97 mg/dL (H)). Liver Function Tests: Recent Labs  Lab 12/17/20 2326 12/19/20 0219 12/20/20 0259 12/21/20 0305 12/22/20 0626  AST 90* 79* 58* 57* 50*  ALT 33 39 38 37 34  ALKPHOS 54 56 54 60 62  BILITOT 0.6 0.5 0.7 0.5 0.7  PROT 5.5* 5.8* 5.5* 5.6* 5.7*  ALBUMIN 2.6* 2.8* 2.7* 2.8* 2.8*   No results for input(s): LIPASE, AMYLASE in the last 168 hours. No results for input(s): AMMONIA in the last 168 hours. Coagulation Profile: No results for input(s): INR, PROTIME in the last 168 hours. Cardiac Enzymes: No results for input(s): CKTOTAL, CKMB, CKMBINDEX, TROPONINI in the last 168 hours. BNP (last 3 results) No results for input(s): PROBNP in the last 8760 hours. HbA1C: No results for input(s): HGBA1C in the last 72 hours. CBG: No results for input(s): GLUCAP in the last 168 hours. Lipid Profile: No results for input(s): CHOL, HDL, LDLCALC, TRIG, CHOLHDL, LDLDIRECT in the last 72 hours. Thyroid Function Tests: No results for input(s): TSH, T4TOTAL, FREET4, T3FREE, THYROIDAB in the last 72 hours. Anemia Panel: Recent Labs    12/21/20 0305 12/22/20 0626  FERRITIN 733* 860*      Radiology Studies: I have reviewed all of the imaging during this hospital visit personally     Scheduled Meds: . allopurinol  200 mg Oral  q morning - 10a  . amLODipine  5 mg Oral q morning - 10a  . vitamin C  500 mg Oral Daily  . B-complex with vitamin C  1 tablet Oral Daily  . baricitinib  1 mg Oral Daily  . Chlorhexidine Gluconate Cloth  6 each Topical Daily  . cholecalciferol  1,000 Units Oral q morning - 10a  . clopidogrel  75 mg Oral q morning - 10a  . escitalopram  20 mg Oral QHS  . famotidine  20 mg Oral q morning - 10a  . gabapentin  100 mg Oral QHS  . Ipratropium-Albuterol  1 puff Inhalation QID  . mouth rinse  15 mL Mouth Rinse BID  . methylPREDNISolone (SOLU-MEDROL) injection  60 mg Intravenous Q12H  . multivitamin with minerals  1 tablet Oral q morning - 10a  . rosuvastatin  20 mg Oral q morning - 10a  . sodium bicarbonate  650 mg Oral BID  . tamsulosin  0.4 mg Oral QHS  . zinc sulfate  220 mg Oral Daily   Continuous Infusions: . sodium chloride Stopped (12/21/20 2152)  . dextrose Stopped (12/21/20 1106)  . heparin 650 Units/hr (12/22/20 0826)     LOS: 5 days        Mauricio Gerome Apley, MD

## 2020-12-22 NOTE — Evaluation (Signed)
Occupational Therapy Evaluation Patient Details Name: Marc [name] MRN: [id number] DOB: 1930-11-09 Today's Date: 12/22/2020    History of Present Illness 87 y.o. male with PMH of CKD 4, CAD s/p CABG, prior stroke. pt with several day h/o weakness and SOB, tested + for COVID   Clinical Impression   Patient is confused, oriented to self and month only, following 1 step directions inconsistently/ with increased time. Per chart states patient lives with his nephew and ambulates with a rolling walker. Currently patient requiring S to min A for UB ADL, max to total A for LB ADL and min x2 for safety for transfer to recliner. Patient is unsteady with posterior lean and R lateral lean into OT while stepping towards chair with rolling walker. Recommend continued acute OT services to maximize patient endurance, safety awareness, balance in order to facilitate D/C to venue listed below.  O2 fluctuating between 86/87% and low 90s% on 7L with activity during session.    Follow Up Recommendations  SNF    Equipment Recommendations  3 in 1 bedside commode;Other (comment) (unsure of home DME)       Precautions / Restrictions Precautions Precautions: Fall Precaution Comments: monitor sats, currently on 7L HFNC Restrictions Weight Bearing Restrictions: No      Mobility Bed Mobility Overal bed mobility: Needs Assistance Bed Mobility: Supine to Sit     Supine to sit: Mod assist;HOB elevated     General bed mobility comments: to assist with LEs and to elevate trunk to sitting    Transfers Overall transfer level: Needs assistance Equipment used: Rolling walker (2 wheeled) Transfers: Sit to/from Stand Sit to Stand: Min assist;+2 physical assistance;+2 safety/equipment         General transfer comment: patient is unsteady with both posterior and lateral loss of balance while stepping towards recliner, poor eccentric control into chair with no initiation to reach back for chair when cued.  currently high fall risk    Balance Overall balance assessment: Needs assistance Sitting-balance support: Feet supported Sitting balance-Leahy Scale: Fair     Standing balance support: Bilateral upper extremity supported Standing balance-Leahy Scale: Poor Standing balance comment: reliant on UE support and external assistance                           ADL either performed or assessed with clinical judgement   ADL Overall ADL's : Needs assistance/impaired Eating/Feeding: Set up;Sitting Eating/Feeding Details (indicate cue type and reason): patient able to drink from cup with straw Grooming: Supervision/safety;Set up;Sitting   Upper Body Bathing: Minimal assistance;Sitting   Lower Body Bathing: Maximal assistance;Sitting/lateral leans;Sit to/from stand;Bed level   Upper Body Dressing : Minimal assistance;Sitting   Lower Body Dressing: Total assistance;Bed level Lower Body Dressing Details (indicate cue type and reason): to don socks Toilet Transfer: Minimal assistance;+2 for physical assistance;+2 for safety/equipment;Ambulation;RW;BSC;Cueing for sequencing;Cueing for safety Toilet Transfer Details (indicate cue type and reason): to recliner; increased time to power up to standing with min A x1-2. patient is unsteady with both posterior and R lateral lean at times while taking steps towards recliner. Poor eccentric control into chair, does not follow cue to reach back when sitting. Toileting- Clothing Manipulation and Hygiene: Total assistance;Sitting/lateral lean;Sit to/from stand       Functional mobility during ADLs: Minimal assistance;+2 for physical assistance;+2 for safety/equipment;Cueing for safety;Cueing for sequencing;Rolling walker General ADL Comments: patient requiring increased assistance with self care due to decreased safety, cognition, activity tolerance, balance and  cardiopulmonary status                  Pertinent Vitals/Pain Pain Assessment:  Faces Faces Pain Scale: Hurts a little bit Pain Location: generalized Pain Descriptors / Indicators: Moaning Pain Intervention(s): Monitored during session     Hand Dominance Right   Extremity/Trunk Assessment Upper Extremity Assessment Upper Extremity Assessment: Generalized weakness   Lower Extremity Assessment Lower Extremity Assessment: Defer to PT evaluation       Communication Communication Communication: HOH   Cognition Arousal/Alertness: Awake/alert Behavior During Therapy: Flat affect Overall Cognitive Status: No family/caregiver present to determine baseline cognitive functioning Area of Impairment: Orientation;Following commands;Safety/judgement;Problem solving                 Orientation Level: Disoriented to;Place;Time;Situation     Following Commands: Follows one step commands with increased time Safety/Judgement: Decreased awareness of deficits;Decreased awareness of safety   Problem Solving: Slow processing;Requires verbal cues;Requires tactile cues General Comments: patient cooperative but confused, oriented to self and month only. when asked why he's in the hospital patient responds "for me"   General Comments  upon arrival to patient's room had O2 out of nose and readings in low to mid 80s, donned and kept on throughout session with desat to high 80s and able to recover quickly in sitting to low 90s on [place] expects to be discharged to:: Skilled nursing facility Living Arrangements: Other relatives (nephew)                               Additional Comments: patient unable to provide accurate history, confused      Prior Functioning/Environment          Comments: unsure of assist level with ADLs at baseline, per chart patient ambulates with a walker at baseline.        OT Problem List: Decreased strength;Decreased activity tolerance;Impaired balance (sitting and/or standing);Decreased  cognition;Decreased safety awareness;Cardiopulmonary status limiting activity      OT Treatment/Interventions: Self-care/ADL training;Therapeutic exercise;DME and/or AE instruction;Therapeutic activities;Cognitive remediation/compensation;Patient/family education;Balance training    OT Goals(Current goals can be found in the care plan section) Acute Rehab OT Goals Patient Stated Goal: "water" OT Goal Formulation: With patient Time For Goal Achievement: 01/05/21 Potential to Achieve Goals: Good  OT Frequency: Min 2X/week           Co-evaluation PT/OT/SLP Co-Evaluation/Treatment: Yes Reason for Co-Treatment: For patient/therapist safety;To address functional/ADL transfers   OT goals addressed during session: ADL's and self-care      AM-PAC OT "6 Clicks" Daily Activity     Outcome Measure Help from another person eating meals?: A Little Help from another person taking care of personal grooming?: A Little Help from another person toileting, which includes using toliet, bedpan, or urinal?: A Lot Help from another person bathing (including washing, rinsing, drying)?: A Lot Help from another person to put on and taking off regular upper body clothing?: A Little Help from another person to put on and taking off regular lower body clothing?: Total 6 Click Score: 14   End of Session Equipment Utilized During Treatment: Rolling walker;Oxygen Nurse Communication: Mobility status;Other (comment) (donned B wrist restraints)  Activity Tolerance: Patient tolerated treatment well Patient left: in chair;with call bell/phone within reach;with chair alarm set  OT Visit Diagnosis: Unsteadiness on feet (R26.81);Other abnormalities of gait and mobility (R26.89);Muscle weakness (generalized) (M62.81)  Time: [id number] OT Time Calculation (min): 40 min Charges:  OT General Charges $OT Visit: 1 Visit OT Evaluation $OT Eval Moderate Complexity: [place] OT OT pager:  [phone]  Rosemary Holms 12/22/2020, 12:10 PM

## 2020-12-22 NOTE — Progress Notes (Signed)
ANTICOAGULATION CONSULT NOTE  Pharmacy Consult for Heparin Indication: Rule out VTE  No Known Allergies  Patient Measurements: Height: 5' 4.5" (163.8 cm) Weight: 67.5 kg (148 lb 13 oz) IBW/kg (Calculated) : 60.35 Heparin Dosing Weight: actual weight  Vital Signs: Temp: 98.2 F (36.8 C) (01/27 1600) Temp Source: Axillary (01/27 1600) BP: 132/47 (01/27 1600) Pulse Rate: 74 (01/27 1600)  Labs: Recent Labs    12/20/20 0259 12/21/20 0305 12/21/20 2032 12/22/20 0626  HGB 9.4* 9.3*  --  9.2*  HCT 28.2* 27.8*  --  28.0*  PLT 169 155  --  169  HEPARINUNFRC  --   --  1.04* 0.97*  CREATININE 2.18* 2.67*  --  1.97*    Estimated Creatinine Clearance: 21.7 mL/min (A) (by C-G formula based on SCr of 1.97 mg/dL (H)).   Medical History: Past Medical History:  Diagnosis Date  . AAA (abdominal aortic aneurysm) ([place])   . AAA (abdominal aortic aneurysm) ([place])   . Arthritis   . CKD (chronic kidney disease)    CKD III-IV (Dr. Posey Pronto, [facility])  . Coronary artery disease    s/p CABG Dr. Servando Snare, Dr. Acie Fredrickson  . Falls frequently   . Gall stones    patiet unsure  . GERD (gastroesophageal reflux disease)   . Gout   . History of Clostridium difficile infection   . History of kidney stones   . History of ulcerative colitis   . Hyperlipidemia   . Hypertension   . Kidney stone on left side    has not passed as of 09/23/12  . Stroke [facility]) 2012   affecting RUE    Medications:  Scheduled:  . allopurinol  200 mg Oral q morning - 10a  . amLODipine  5 mg Oral q morning - 10a  . vitamin C  500 mg Oral Daily  . B-complex with vitamin C  1 tablet Oral Daily  . baricitinib  1 mg Oral Daily  . Chlorhexidine Gluconate Cloth  6 each Topical Daily  . cholecalciferol  1,000 Units Oral q morning - 10a  . clopidogrel  75 mg Oral q morning - 10a  . escitalopram  20 mg Oral QHS  . famotidine  20 mg Oral q morning - 10a  . feeding supplement  237 mL Oral BID BM  . gabapentin  100 mg  Oral QHS  . Ipratropium-Albuterol  1 puff Inhalation QID  . mouth rinse  15 mL Mouth Rinse BID  . [START ON 12/23/2020] methylPREDNISolone (SOLU-MEDROL) injection  60 mg Intravenous Daily  . multivitamin with minerals  1 tablet Oral q morning - 10a  . rosuvastatin  20 mg Oral q morning - 10a  . sodium bicarbonate  650 mg Oral BID  . tamsulosin  0.4 mg Oral QHS  . zinc sulfate  220 mg Oral Daily   Infusions:  . sodium chloride Stopped (12/21/20 2152)  . dextrose Stopped (12/21/20 1106)  . heparin 650 Units/hr (12/22/20 0826)   PRN: acetaminophen, guaiFENesin-dextromethorphan  Assessment: 87 yo male admitted with acute hypoxemic respiratory failure due to COVID-19.  Has been receiving lovenox VTE prophylaxis, now with rising d-dimer so pharmacy consulted to dose IV heparin for r/o DVT/PE. LE dopplers pending.  12/22/20 4:52 PM   HL 0626 above goal at 0.97  No bleeding or infusion related issues reported  Rpt HL 1646 decreased to 0.64  Goal of Therapy:  Heparin level 0.3-0.7 units/ml Monitor platelets by anticoagulation protocol: Yes   Plan:  Continue Heparin  infusion at  650 units/hr Recheck HL in 8 hours Daily HL/CBC  Monitor for s/s of bleeding  Minda Ditto PharmD 12/22/2020, 4:53 PM

## 2020-12-22 NOTE — Progress Notes (Signed)
ANTICOAGULATION CONSULT NOTE  Pharmacy Consult for Heparin Indication: Rule out VTE  No Known Allergies  Patient Measurements: Height: 5' 4.5" (163.8 cm) Weight: 67.5 kg (148 lb 13 oz) IBW/kg (Calculated) : 60.35 Heparin Dosing Weight: actual weight  Vital Signs: Temp: 97.6 F (36.4 C) (01/27 0800) Temp Source: Oral (01/27 0800) BP: 162/77 (01/27 0800) Pulse Rate: 88 (01/27 0800)  Labs: Recent Labs    12/20/20 0259 12/21/20 0305 12/21/20 2032 12/22/20 0626  HGB 9.4* 9.3*  --  9.2*  HCT 28.2* 27.8*  --  28.0*  PLT 169 155  --  169  HEPARINUNFRC  --   --  1.04* 0.97*  CREATININE 2.18* 2.67*  --  1.97*    Estimated Creatinine Clearance: 21.7 mL/min (A) (by C-G formula based on SCr of 1.97 mg/dL (H)).   Medical History: Past Medical History:  Diagnosis Date  . AAA (abdominal aortic aneurysm) ([place])   . AAA (abdominal aortic aneurysm) ([place])   . Arthritis   . CKD (chronic kidney disease)    CKD III-IV (Dr. Posey Pronto, [facility])  . Coronary artery disease    s/p CABG Dr. Servando Snare, Dr. Acie Fredrickson  . Falls frequently   . Gall stones    patiet unsure  . GERD (gastroesophageal reflux disease)   . Gout   . History of Clostridium difficile infection   . History of kidney stones   . History of ulcerative colitis   . Hyperlipidemia   . Hypertension   . Kidney stone on left side    has not passed as of 09/23/12  . Stroke [facility]) 2012   affecting RUE    Medications:  Scheduled:  . allopurinol  200 mg Oral q morning - 10a  . amLODipine  5 mg Oral q morning - 10a  . vitamin C  500 mg Oral Daily  . B-complex with vitamin C  1 tablet Oral Daily  . baricitinib  1 mg Oral Daily  . Chlorhexidine Gluconate Cloth  6 each Topical Daily  . cholecalciferol  1,000 Units Oral q morning - 10a  . clopidogrel  75 mg Oral q morning - 10a  . escitalopram  20 mg Oral QHS  . famotidine  20 mg Oral q morning - 10a  . gabapentin  100 mg Oral QHS  . Ipratropium-Albuterol  1 puff  Inhalation QID  . mouth rinse  15 mL Mouth Rinse BID  . methylPREDNISolone (SOLU-MEDROL) injection  60 mg Intravenous Q12H  . multivitamin with minerals  1 tablet Oral q morning - 10a  . rosuvastatin  20 mg Oral q morning - 10a  . sodium bicarbonate  650 mg Oral BID  . tamsulosin  0.4 mg Oral QHS  . zinc sulfate  220 mg Oral Daily   Infusions:  . sodium chloride Stopped (12/21/20 2152)  . dextrose Stopped (12/21/20 1106)  . heparin     PRN: acetaminophen, guaiFENesin-dextromethorphan  Assessment: 87 yo male admitted with acute hypoxemic respiratory failure due to COVID-19.  Has been receiving lovenox VTE prophylaxis, now with rising d-dimer so pharmacy consulted to dose IV heparin for r/o DVT/PE. LE dopplers pending.  12/22/20 8:20 AM   HL above goal at 0.97  No bleeding or infusion related issues reported  Goal of Therapy:  Heparin level 0.3-0.7 units/ml Monitor platelets by anticoagulation protocol: Yes   Plan:  Decrease heparin infusion to 650 units/hr Recheck HL in 8 hours Daily HL/CBC  Monitor for s/s of bleeding  Gertrude Bucks P. Legrand Como, PharmD, BCPS  Clinical Pharmacist [place] Please utilize Amion for appropriate phone number to reach the unit pharmacist ([place]) 12/22/2020 8:20 AM

## 2020-12-22 NOTE — Progress Notes (Signed)
Initial Nutrition Assessment  INTERVENTION:   -Ensure Enlive po BID, each supplement provides 350 kcal and 20 grams of protein  -Magic cup BID with meals, each supplement provides 290 kcal and 9 grams of protein  NUTRITION DIAGNOSIS:   Increased nutrient needs related to acute illness (COVID-19 infection) as evidenced by estimated needs.  GOAL:   Patient will meet greater than or equal to 90% of their needs  MONITOR:   PO intake,Supplement acceptance,Labs,Weight trends,I & O's  REASON FOR ASSESSMENT:   Consult Assessment of nutrition requirement/status  ASSESSMENT:   87 year old male with past medical history for ischemic cardiomyopathy status post CABG, history of CVA chronic kidney disease stage IV who presented with dyspnea. Admitted to the hospital with a working diagnosis of acute hypoxic respiratory failure due to SARS COVID-19 viral pneumonia.  COVID+ since 1/22.  Patient with poor appetite r/t mental status. Currently alert/oriented x 2. Refused PO last night. Will order protein supplements d/t increased needs from active COVID-19 infection.  Admission weight: 148 lbs. No weight loss noted PTA.  Medications: Vitamin C, B complex w/ Vitamin C, Vitamin D, Multivitamin with minerals daily, Zinc sulfate  Labs reviewed: Low K  NUTRITION - FOCUSED PHYSICAL EXAM:  Unable to complete  Diet Order:   Diet Order            Diet regular Room service appropriate? Yes; Fluid consistency: Thin  Diet effective now                 EDUCATION NEEDS:   No education needs have been identified at this time  Skin:  Skin Assessment: Skin Integrity Issues: Skin Integrity Issues:: Diabetic Ulcer Diabetic Ulcer: toes  Last BM:  1/26 -type 6  Height:   Ht Readings from Last 1 Encounters:  12/18/20 5' 4.5" (1.638 m)    Weight:   Wt Readings from Last 1 Encounters:  12/18/20 67.5 kg   BMI:  Body mass index is 25.15 kg/m.  Estimated Nutritional Needs:    Kcal:  1700-1900  Protein:  80-95g  Fluid:  1.9L/day  Marc Bibles, MS, RD, LDN Inpatient Clinical Dietitian Contact information available via Amion

## 2020-12-23 DIAGNOSIS — U071 COVID-19: Secondary | ICD-10-CM | POA: Diagnosis not present

## 2020-12-23 DIAGNOSIS — J9601 Acute respiratory failure with hypoxia: Secondary | ICD-10-CM | POA: Diagnosis not present

## 2020-12-23 DIAGNOSIS — G9341 Metabolic encephalopathy: Secondary | ICD-10-CM | POA: Diagnosis not present

## 2020-12-23 DIAGNOSIS — I714 Abdominal aortic aneurysm, without rupture: Secondary | ICD-10-CM | POA: Diagnosis not present

## 2020-12-23 LAB — COMPREHENSIVE METABOLIC PANEL
ALT: 34 U/L (ref 0–44)
AST: 40 U/L (ref 15–41)
Albumin: 2.7 g/dL — ABNORMAL LOW (ref 3.5–5.0)
Alkaline Phosphatase: 62 U/L (ref 38–126)
Anion gap: 9 (ref 5–15)
BUN: 51 mg/dL — ABNORMAL HIGH (ref 8–23)
CO2: 20 mmol/L — ABNORMAL LOW (ref 22–32)
Calcium: 8.1 mg/dL — ABNORMAL LOW (ref 8.9–10.3)
Chloride: 109 mmol/L (ref 98–111)
Creatinine, Ser: 2.19 mg/dL — ABNORMAL HIGH (ref 0.61–1.24)
GFR, Estimated: 28 mL/min — ABNORMAL LOW (ref >60)
Glucose, Bld: 195 mg/dL — ABNORMAL HIGH (ref 70–99)
Potassium: 3.5 mmol/L (ref 3.5–5.1)
Sodium: 138 mmol/L (ref 135–145)
Total Bilirubin: 0.6 mg/dL (ref 0.3–1.2)
Total Protein: 5.4 g/dL — ABNORMAL LOW (ref 6.5–8.1)

## 2020-12-23 LAB — CBC
HCT: 30.2 % — ABNORMAL LOW (ref 39.0–52.0)
Hemoglobin: 9.9 g/dL — ABNORMAL LOW (ref 13.0–17.0)
MCH: 29.2 pg (ref 26.0–34.0)
MCHC: 32.8 g/dL (ref 30.0–36.0)
MCV: 89.1 fL (ref 80.0–100.0)
Platelets: 155 10*3/uL (ref 150–400)
RBC: 3.39 MIL/uL — ABNORMAL LOW (ref 4.22–5.81)
RDW: 14.1 % (ref 11.5–15.5)
WBC: 7.8 10*3/uL (ref 4.0–10.5)
nRBC: 0 % (ref 0.0–0.2)

## 2020-12-23 LAB — HEPARIN LEVEL (UNFRACTIONATED): Heparin Unfractionated: 0.53 IU/mL (ref 0.30–0.70)

## 2020-12-23 LAB — D-DIMER, QUANTITATIVE: D-Dimer, Quant: 3.73 ug/mL-FEU — ABNORMAL HIGH (ref 0.00–0.50)

## 2020-12-23 LAB — FERRITIN: Ferritin: 909 ng/mL — ABNORMAL HIGH (ref 24–336)

## 2020-12-23 LAB — C-REACTIVE PROTEIN: CRP: 2 mg/dL — ABNORMAL HIGH (ref <1.0)

## 2020-12-23 MED ORDER — ENOXAPARIN SODIUM 30 MG/0.3ML ~~LOC~~ SOLN: 30.0000 mg | SUBCUTANEOUS | Status: DC

## 2020-12-23 MED ORDER — ENOXAPARIN SODIUM 30 MG/0.3ML ~~LOC~~ SOLN
30.0000 mg | SUBCUTANEOUS | Status: DC
  Administered 2020-12-23: 30 mg via SUBCUTANEOUS
  Filled 2020-12-23: qty 0.3

## 2020-12-23 NOTE — Progress Notes (Signed)
PROGRESS NOTE    Marc [name]  [id number] DOB: 1930/08/18 DOA: 12/17/2020 PCP: Leonard Downing, MD    Brief Narrative:  Mr. Marc [name] was admitted to the hospital with a working diagnosis of acute hypoxic respiratory failure due to SARS COVID-19 viral pneumonia.  87 year old male with past medical history for ischemic cardiomyopathy status post CABG, history of CVA chronic kidney disease stage IV who presented with dyspnea. Patient had several days of generalized weakness and dyspnea, no fevers no chills. Her shortness of breath was progressive and worsening to the point where he called EMS. On his initial physical examination his oximetry was 70% on room air, his blood pressure 131/50, heart rate 69, respiratory 24, oxygenation 99% on supplemental oxygen, 40 L. His lungs were clear to auscultation bilaterally, heart S1-S2, present rhythmic, soft abdomen, no extremity edema. SARS COVID-19 positive.  Chest radiograph with dense bilateral lower lobe infiltrates. EKG 67 bpm, left axis deviation, QTC 544, sinus rhythm, low voltage, no significant ST segment or T wave changes.  Patient with very poor oral intake, has been confused and agitated. With aggressive medical therapy he has improved his oxygen saturation.  Patient with overall decline in his health, his daughter requested to change code status to DNR and continue supportive medical therapy with main focus in avoiding further suffering.   Consulted palliative care team.    Assessment & Plan:   Principal Problem:   Acute hypoxemic respiratory failure due to COVID-19 [facility]) Active Problems:   Coronary artery disease   History of stroke without residual deficits   AAA (abdominal aortic aneurysm) (HCC)   Essential hypertension   CKD (chronic kidney disease) stage 4, GFR 15-29 ml/min (HCC)   Acute metabolic encephalopathy   Acute respiratory failure with hypoxia (HCC)   Pneumonia due to COVID-19 virus   CAP  (community acquired pneumonia)   Severe sinus bradycardia   QT prolongation   CKD (chronic kidney disease), stage IV ([place])   1.Acute hypoxic respiratory failure due to SARS COVID-19 viral pneumonia.  Sp remdesivir #5/5  RR: 15  Pulse oxymetry: 94%  Fi02: 6 L min per Venti mask/ Nelson   COVID-19 Labs  Recent Labs    12/21/20 0305 12/22/20 0626 12/23/20 0156  DDIMER 5.32* 5.90* 3.73*  FERRITIN 733* 860* 909*  CRP 4.4* 2.8* 2.0*    Lab Results  Component Value Date   SARSCOV2NAA POSITIVE (A) 12/17/2020    His PO intake has mildly improved, inflammatory markers are stable.  [place] lower extremities negative for DVT.   Continue with baricitinib and methylprednisolone. Continue with bronchodilator therapy, airway clearing techniques, and antitussive agents.  Mobility as tolerated.  Oxygen requirements improving and d dimer trending down, will transition to prophylactic anticoagulation.    Palliative care has been consulted.     2. Bradycardia due to AV blockers. Prolonged Qtc. CAD  off dobutamine. Check EKG today to follow on intervals.   Continue with clopidogrel and rosuvastatin.   3. Acute metabolic encephalopathy with anorexia/ depression. off hands restrain, but continue with waist restrain. Po intake has improved, but patient continue to be disoriented.   Continue with escitalopram.   4. AKI on stage IV CKD with anion gap metabolic acidosis.  stable renal function with serum cr 2,19 with K at 3,5 and bicarbonate 20.  5. OSA. Currently not using Cpap.   6. Right foot second toe ulceration (present on admission). Continue with local wound care.   7. HTN. Continue blood pressure control with amlodipine.  Status is: Inpatient  Remains inpatient appropriate because: respiratory failure need for supplemental 02    Dispo: The patient is from: Home              Anticipated d/c is to: Home              Anticipated d/c date is: 2 days               Patient currently is not medically stable to d/c.   Difficult to place patient No  DVT prophylaxis: Enoxaparin   Code Status:   DNR   Family Communication:  I spoke over the phone with the patient's daughter about patient's  condition, plan of care, prognosis and all questions were addressed.    Nutrition Status: Nutrition Problem: Increased nutrient needs Etiology: acute illness (COVID-19 infection) Signs/Symptoms: estimated needs Interventions: Ensure Enlive (each supplement provides 350kcal and 20 grams of protein),MVI,Magic cup     *    Subjective: Patient is disorientated, off hands restrains, tolerating po, no nausea or vomiting,   Objective: Vitals:   12/22/20 1747 12/22/20 2009 12/23/20 0331 12/23/20 0815  BP: 135/72 (!) 143/73 (!) 146/68   Pulse: 91 79 71   Resp: '17 15 15   '$ Temp: 98.7 F (37.1 C) 98.1 F (36.7 C) 98 F (36.7 C)   TempSrc: Oral Oral Oral   SpO2: 91% (!) 88% 95% 94%  Weight:      Height:        Intake/Output Summary (Last 24 hours) at 12/23/2020 1104 Last data filed at 12/22/2020 1800 Gross per 24 hour  Intake 262.18 ml  Output 560 ml  Net -297.82 ml   Filed Weights   12/18/20 1300 12/18/20 1525  Weight: 69 kg 67.5 kg    Examination:   General: deconditioned and ill looking appearing.  Neurology: Awake and alert, non focal  E ENT: no pallor, no icterus, oral mucosa moist Cardiovascular: No JVD. S1-S2 present, rhythmic, no gallops, rubs, or murmurs. No lower extremity edema. Pulmonary: positive breath sounds bilaterally, Gastrointestinal. Abdomen soft and non tender Skin. Right foot toe ulcers.  Musculoskeletal: no joint deformities     Data Reviewed: I have personally reviewed following labs and imaging studies  CBC: Recent Labs  Lab 12/17/20 1951 12/19/20 0219 12/20/20 0259 12/21/20 0305 12/22/20 0626 12/23/20 0156  WBC 5.5 6.5 8.3 8.7 8.8 7.8  NEUTROABS 4.4 5.4 7.5 8.1*  --   --   HGB 10.4* 10.3* 9.4* 9.3* 9.2* 9.9*   HCT 31.2* 31.1* 28.2* 27.8* 28.0* 30.2*  MCV 88.1 88.1 87.0 86.3 88.9 89.1  PLT 155 168 169 155 169 [id number]   Basic Metabolic Panel: Recent Labs  Lab 12/18/20 0811 12/19/20 0219 12/20/20 0259 12/21/20 0305 12/22/20 0626 12/23/20 0156  NA  --  139 140 138 137 138  K  --  3.8 4.0 4.0 3.4* 3.5  CL  --  109 109 106 107 109  CO2  --  17* 15* 20* 17* 20*  GLUCOSE  --  124* 131* 191* 205* 195*  BUN  --  57* 67* 66* 60* 51*  CREATININE  --  2.40* 2.18* 2.67* 1.97* 2.19*  CALCIUM  --  8.1* 8.2* 8.1* 8.0* 8.1*  MG 2.0 2.1 2.1 2.3  --   --   PHOS 5.0* 4.6 4.4 4.1  --   --    GFR: Estimated Creatinine Clearance: 19.5 mL/min (A) (by C-G formula based on SCr of 2.19 mg/dL (H)). Liver Function Tests:  Recent Labs  Lab 12/19/20 0219 12/20/20 0259 12/21/20 0305 12/22/20 0626 12/23/20 0156  AST 79* 58* 57* 50* 40  ALT 39 38 37 34 34  ALKPHOS 56 54 60 62 62  BILITOT 0.5 0.7 0.5 0.7 0.6  PROT 5.8* 5.5* 5.6* 5.7* 5.4*  ALBUMIN 2.8* 2.7* 2.8* 2.8* 2.7*   No results for input(s): LIPASE, AMYLASE in the last 168 hours. No results for input(s): AMMONIA in the last 168 hours. Coagulation Profile: No results for input(s): INR, PROTIME in the last 168 hours. Cardiac Enzymes: No results for input(s): CKTOTAL, CKMB, CKMBINDEX, TROPONINI in the last 168 hours. BNP (last 3 results) No results for input(s): PROBNP in the last 8760 hours. HbA1C: No results for input(s): HGBA1C in the last 72 hours. CBG: No results for input(s): GLUCAP in the last 168 hours. Lipid Profile: No results for input(s): CHOL, HDL, LDLCALC, TRIG, CHOLHDL, LDLDIRECT in the last 72 hours. Thyroid Function Tests: No results for input(s): TSH, T4TOTAL, FREET4, T3FREE, THYROIDAB in the last 72 hours. Anemia Panel: Recent Labs    12/22/20 0626 12/23/20 0156  FERRITIN 860* 909*      Radiology Studies: I have reviewed all of the imaging during this hospital visit personally     Scheduled Meds: . allopurinol  200  mg Oral q morning - 10a  . amLODipine  5 mg Oral q morning - 10a  . vitamin C  500 mg Oral Daily  . B-complex with vitamin C  1 tablet Oral Daily  . baricitinib  1 mg Oral Daily  . Chlorhexidine Gluconate Cloth  6 each Topical Daily  . cholecalciferol  1,000 Units Oral q morning - 10a  . clopidogrel  75 mg Oral q morning - 10a  . escitalopram  20 mg Oral QHS  . famotidine  20 mg Oral q morning - 10a  . feeding supplement  237 mL Oral BID BM  . gabapentin  100 mg Oral QHS  . Ipratropium-Albuterol  1 puff Inhalation BID  . mouth rinse  15 mL Mouth Rinse BID  . methylPREDNISolone (SOLU-MEDROL) injection  60 mg Intravenous Daily  . multivitamin with minerals  1 tablet Oral q morning - 10a  . rosuvastatin  20 mg Oral q morning - 10a  . sodium bicarbonate  650 mg Oral BID  . tamsulosin  0.4 mg Oral QHS  . zinc sulfate  220 mg Oral Daily   Continuous Infusions: . sodium chloride Stopped (12/21/20 2152)  . dextrose 50 mL/hr at 12/22/20 2124  . heparin 650 Units/hr (12/22/20 0826)     LOS: 6 days        Posey Petrik Gerome Apley, MD

## 2020-12-23 NOTE — Progress Notes (Addendum)
ANTICOAGULATION CONSULT NOTE  Pharmacy Consult for Heparin Indication: Rule out VTE  No Known Allergies  Patient Measurements: Height: 5' 4.5" (163.8 cm) Weight: 67.5 kg (148 lb 13 oz) IBW/kg (Calculated) : 60.35 Heparin Dosing Weight: actual weight  Vital Signs: Temp: 98.1 F (36.7 C) (01/27 2009) Temp Source: Oral (01/27 2009) BP: 143/73 (01/27 2009) Pulse Rate: 79 (01/27 2009)  Labs: Recent Labs    12/21/20 0305 12/21/20 2032 12/22/20 0626 12/22/20 1646 12/23/20 0156  HGB 9.3*  --  9.2*  --  9.9*  HCT 27.8*  --  28.0*  --  30.2*  PLT 155  --  169  --  155  HEPARINUNFRC  --    < > 0.97* 0.64 0.53  CREATININE 2.67*  --  1.97*  --  2.19*   < > = values in this interval not displayed.    Estimated Creatinine Clearance: 19.5 mL/min (A) (by C-G formula based on SCr of 2.19 mg/dL (H)).   Medical History: Past Medical History:  Diagnosis Date  . AAA (abdominal aortic aneurysm) ([place])   . AAA (abdominal aortic aneurysm) ([place])   . Arthritis   . CKD (chronic kidney disease)    CKD III-IV (Dr. Posey Pronto, [facility])  . Coronary artery disease    s/p CABG Dr. Servando Snare, Dr. Acie Fredrickson  . Falls frequently   . Gall stones    patiet unsure  . GERD (gastroesophageal reflux disease)   . Gout   . History of Clostridium difficile infection   . History of kidney stones   . History of ulcerative colitis   . Hyperlipidemia   . Hypertension   . Kidney stone on left side    has not passed as of 09/23/12  . Stroke [facility]) 2012   affecting RUE    Medications:  Scheduled:  . allopurinol  200 mg Oral q morning - 10a  . amLODipine  5 mg Oral q morning - 10a  . vitamin C  500 mg Oral Daily  . B-complex with vitamin C  1 tablet Oral Daily  . baricitinib  1 mg Oral Daily  . Chlorhexidine Gluconate Cloth  6 each Topical Daily  . cholecalciferol  1,000 Units Oral q morning - 10a  . clopidogrel  75 mg Oral q morning - 10a  . escitalopram  20 mg Oral QHS  . famotidine  20 mg Oral q  morning - 10a  . feeding supplement  237 mL Oral BID BM  . gabapentin  100 mg Oral QHS  . Ipratropium-Albuterol  1 puff Inhalation BID  . mouth rinse  15 mL Mouth Rinse BID  . methylPREDNISolone (SOLU-MEDROL) injection  60 mg Intravenous Daily  . multivitamin with minerals  1 tablet Oral q morning - 10a  . rosuvastatin  20 mg Oral q morning - 10a  . sodium bicarbonate  650 mg Oral BID  . tamsulosin  0.4 mg Oral QHS  . zinc sulfate  220 mg Oral Daily   Infusions:  . sodium chloride Stopped (12/21/20 2152)  . dextrose 50 mL/hr at 12/22/20 2124  . heparin 650 Units/hr (12/22/20 0826)   PRN: acetaminophen, guaiFENesin-dextromethorphan, Ipratropium-Albuterol  Assessment: 87 yo male admitted with acute hypoxemic respiratory failure due to COVID-19.  Has been receiving lovenox VTE prophylaxis, now with rising d-dimer so pharmacy consulted to dose IV heparin for r/o DVT/PE. LE dopplers pending.  12/23/20 2:56 AM   HL 0.53 therapeutic on 650 units  Hgb 9.9, plts 155  No bleeding or  infusion related issues reported  Goal of Therapy:  Heparin level 0.3-0.7 units/ml Monitor platelets by anticoagulation protocol: Yes   Plan:  Continue Heparin infusion at  650 units/hr Daily HL/CBC  Monitor for s/s of bleeding  Dolly Rias RPh 12/23/2020, 2:57 AM

## 2020-12-23 NOTE — Care Management Important Message (Signed)
Important Message  Patient Details IM Letter placed in Patient's door Caddy. Name: DANLEY ALARCON MRN: [id number] Date of Birth: 06/23/1930   Medicare Important Message Given:  Yes     Kerin Salen 12/23/2020, 1:52 PM

## 2020-12-24 DIAGNOSIS — I714 Abdominal aortic aneurysm, without rupture: Secondary | ICD-10-CM | POA: Diagnosis not present

## 2020-12-24 DIAGNOSIS — Z7189 Other specified counseling: Secondary | ICD-10-CM | POA: Diagnosis not present

## 2020-12-24 DIAGNOSIS — J9601 Acute respiratory failure with hypoxia: Secondary | ICD-10-CM | POA: Diagnosis not present

## 2020-12-24 DIAGNOSIS — Z8673 Personal history of transient ischemic attack (TIA), and cerebral infarction without residual deficits: Secondary | ICD-10-CM | POA: Diagnosis not present

## 2020-12-24 DIAGNOSIS — U071 COVID-19: Secondary | ICD-10-CM | POA: Diagnosis not present

## 2020-12-24 DIAGNOSIS — G9341 Metabolic encephalopathy: Secondary | ICD-10-CM | POA: Diagnosis not present

## 2020-12-24 DIAGNOSIS — Z515 Encounter for palliative care: Secondary | ICD-10-CM

## 2020-12-24 LAB — COMPREHENSIVE METABOLIC PANEL
ALT: 32 U/L (ref 0–44)
AST: 33 U/L (ref 15–41)
Albumin: 2.7 g/dL — ABNORMAL LOW (ref 3.5–5.0)
Alkaline Phosphatase: 59 U/L (ref 38–126)
Anion gap: 12 (ref 5–15)
BUN: 44 mg/dL — ABNORMAL HIGH (ref 8–23)
CO2: 21 mmol/L — ABNORMAL LOW (ref 22–32)
Calcium: 8.1 mg/dL — ABNORMAL LOW (ref 8.9–10.3)
Chloride: 102 mmol/L (ref 98–111)
Creatinine, Ser: 2.05 mg/dL — ABNORMAL HIGH (ref 0.61–1.24)
GFR, Estimated: 30 mL/min — ABNORMAL LOW (ref >60)
Glucose, Bld: 179 mg/dL — ABNORMAL HIGH (ref 70–99)
Potassium: 3.7 mmol/L (ref 3.5–5.1)
Sodium: 135 mmol/L (ref 135–145)
Total Bilirubin: 0.6 mg/dL (ref 0.3–1.2)
Total Protein: 5.5 g/dL — ABNORMAL LOW (ref 6.5–8.1)

## 2020-12-24 LAB — D-DIMER, QUANTITATIVE: D-Dimer, Quant: 3.75 ug/mL-FEU — ABNORMAL HIGH (ref 0.00–0.50)

## 2020-12-24 LAB — FERRITIN: Ferritin: 815 ng/mL — ABNORMAL HIGH (ref 24–336)

## 2020-12-24 LAB — C-REACTIVE PROTEIN: CRP: 1.3 mg/dL — ABNORMAL HIGH (ref <1.0)

## 2020-12-24 MED ORDER — ENOXAPARIN SODIUM 30 MG/0.3ML ~~LOC~~ SOLN: 30.0000 mg | Freq: Two times a day (BID) | SUBCUTANEOUS | Status: DC

## 2020-12-24 MED ORDER — BISACODYL 5 MG PO TBEC
10.0000 mg | DELAYED_RELEASE_TABLET | Freq: Once | ORAL | Status: AC
  Administered 2020-12-24: 10 mg via ORAL
  Filled 2020-12-24: qty 2

## 2020-12-24 MED ORDER — METHYLPREDNISOLONE SODIUM SUCC 125 MG IJ SOLR
40.0000 mg | Freq: Every day | INTRAMUSCULAR | Status: DC
  Administered 2020-12-25 – 2020-12-27 (×3): 40 mg via INTRAVENOUS
  Filled 2020-12-24 (×3): qty 2

## 2020-12-24 MED ORDER — ENOXAPARIN SODIUM 80 MG/0.8ML ~~LOC~~ SOLN
70.0000 mg | SUBCUTANEOUS | Status: DC
  Administered 2020-12-24 – 2020-12-25 (×2): 70 mg via SUBCUTANEOUS
  Filled 2020-12-24 (×2): qty 0.8

## 2020-12-24 NOTE — Plan of Care (Signed)
  Problem: Safety: Goal: Non-violent Restraint(s) Outcome: Progressing   Problem: Elimination: Goal: Will not experience complications related to urinary retention Outcome: Progressing   Problem: Pain Managment: Goal: General experience of comfort will improve Outcome: Progressing   Problem: Safety: Goal: Ability to remain free from injury will improve Outcome: Progressing   Problem: Skin Integrity: Goal: Risk for impaired skin integrity will decrease Outcome: Progressing   Problem: Respiratory: Goal: Will maintain a patent airway Outcome: Progressing Goal: Complications related to the disease process, condition or treatment will be avoided or minimized Outcome: Progressing

## 2020-12-24 NOTE — Plan of Care (Deleted)
  Problem: Safety: Goal: Non-violent Restraint(s) Outcome: Progressing   Problem: Elimination: Goal: Will not experience complications related to urinary retention Outcome: Progressing   Problem: Pain Managment: Goal: General experience of comfort will improve Outcome: Progressing   Problem: Safety: Goal: Ability to remain free from injury will improve Outcome: Progressing   Problem: Skin Integrity: Goal: Risk for impaired skin integrity will decrease Outcome: Progressing

## 2020-12-24 NOTE — Plan of Care (Signed)
  Problem: Safety: Goal: Non-violent Restraint(s) 12/24/2020 2319 by Darrick Huntsman, RN Outcome: Progressing   Problem: Education: Goal: Knowledge of General Education information will improve Description: Including pain rating scale, medication(s)/side effects and non-pharmacologic comfort measures 12/24/2020 2319 by Darrick Huntsman, RN Outcome: Progressing   Problem: Health Behavior/Discharge Planning: Goal: Ability to manage health-related needs will improve 12/24/2020 2319 by Darrick Huntsman, RN Outcome: Progressing   Problem: Clinical Measurements: Goal: Ability to maintain clinical measurements within normal limits will improve 12/24/2020 2319 by Darrick Huntsman, RN Outcome: Progressing   Problem: Clinical Measurements: Goal: Will remain free from infection 12/24/2020 2319 by Darrick Huntsman, RN Outcome: Progressing   Problem: Clinical Measurements: Goal: Diagnostic test results will improve 12/24/2020 2319 by Darrick Huntsman, RN Outcome: Progressing   Problem: Clinical Measurements: Goal: Respiratory complications will improve 12/24/2020 2319 by Darrick Huntsman, RN Outcome: Progressing   Problem: Clinical Measurements: Goal: Cardiovascular complication will be avoided 12/24/2020 2319 by Darrick Huntsman, RN Outcome: Progressing   Problem: Activity: Goal: Risk for activity intolerance will decrease 12/24/2020 2319 by Darrick Huntsman, RN Outcome: Progressing   Problem: Nutrition: Goal: Adequate nutrition will be maintained 12/24/2020 2319 by Darrick Huntsman, RN Outcome: Progressing   Problem: Coping: Goal: Level of anxiety will decrease 12/24/2020 2319 by Darrick Huntsman, RN Outcome: Progressing   Problem: Elimination: Goal: Will not experience complications related to bowel motility 12/24/2020 2319 by Darrick Huntsman, RN Outcome: Progressing   Problem: Elimination: Goal: Will  not experience complications related to urinary retention 12/24/2020 2319 by Darrick Huntsman, RN Outcome: Progressing   Problem: Pain Managment: Goal: General experience of comfort will improve 12/24/2020 2319 by Darrick Huntsman, RN Outcome: Progressing   Problem: Safety: Goal: Ability to remain free from injury will improve 12/24/2020 2319 by Darrick Huntsman, RN Outcome: Progressing   Problem: Skin Integrity: Goal: Risk for impaired skin integrity will decrease 12/24/2020 2319 by Darrick Huntsman, RN Outcome: Progressing   Problem: Education: Goal: Knowledge of risk factors and measures for prevention of condition will improve 12/24/2020 2319 by Darrick Huntsman, RN Outcome: Progressing   Problem: Coping: Goal: Psychosocial and spiritual needs will be supported 12/24/2020 2319 by Darrick Huntsman, RN Outcome: Progressing   Problem: Respiratory: Goal: Will maintain a patent airway 12/24/2020 2319 by Darrick Huntsman, RN Outcome: Progressing   Problem: Respiratory: Goal: Complications related to the disease process, condition or treatment will be avoided or minimized 12/24/2020 2319 by Darrick Huntsman, RN Outcome: Progressing

## 2020-12-24 NOTE — Progress Notes (Signed)
PROGRESS NOTE    Marc [name]  [id number] DOB: 08-03-1930 DOA: 12/17/2020 PCP: Marc Downing, MD    Brief Narrative:  Marc [name] was admitted to the hospital with a working diagnosis of acute hypoxic respiratory failure due to SARS COVID-19 viral pneumonia.  87 year old [name] with past medical history for ischemic cardiomyopathy status post CABG, history of CVA chronic kidney disease stage IV who presented with dyspnea. Patient had several days of generalized weakness and dyspnea, no fevers no chills. Her shortness of breath was progressive and worsening to the point where he called EMS. On his initial physical examination his oximetry was 70% on room air, his blood pressure 131/50, heart rate 69, respiratory 24, oxygenation 99% on supplemental oxygen, 40 L. His lungs were clear to auscultation bilaterally, heart S1-S2, present rhythmic, soft abdomen, no extremity edema. SARS COVID-19 positive.  Chest radiograph with dense bilateral lower lobe infiltrates. EKG 67 bpm, left axis deviation, QTC 544, sinus rhythm, low voltage, no significant ST segment or T wave changes.  Patient with very poor oral intake, has been confused and agitated. With aggressive medical therapy he has improved his oxygen saturation.  Patient with overall decline in his health, his daughter requested to change code status to DNR and continue supportive medical therapy with main focus in avoiding further suffering.   Consulted palliative care team.    Assessment & Plan:   Principal Problem:   Acute hypoxemic respiratory failure due to COVID-19 [facility]) Active Problems:   Coronary artery disease   History of stroke without residual deficits   AAA (abdominal aortic aneurysm) (HCC)   Essential hypertension   CKD (chronic kidney disease) stage 4, GFR 15-29 ml/min (HCC)   Acute metabolic encephalopathy   Acute respiratory failure with hypoxia (HCC)   Pneumonia due to COVID-19 virus   CAP  (community acquired pneumonia)   Severe sinus bradycardia   QT prolongation   CKD (chronic kidney disease), stage IV ([place])    1.Acute hypoxic respiratory failure due to SARS COVID-19 viral pneumonia.  Sp remdesivir #5/5  RR: 16  Pulse oxymetry: 92%  Fi02: 2 L/min per [place]   COVID-19 Labs  Recent Labs    12/22/20 0626 12/23/20 0156 12/24/20 0457  DDIMER 5.90* 3.73* 3.75*  FERRITIN 860* 909* 815*  CRP 2.8* 2.0* 1.3*    Lab Results  Component Value Date   SARSCOV2NAA POSITIVE (A) 12/17/2020    [place] lower extremities negative for DVT.   Trending down inflammatory markers, patient now off restrains, tolerating po.   On baricitinib and methylprednisolone (decrease dose to 40 mg IV daily) with good toleration. Onbronchodilator therapy, airway clearing techniques, and antitussive agents.  Mobility as tolerated.  Will transition to bid enoxaparin due to persistent elevation in D dimer. Avoid IV contrast due to low GFR.  2. Bradycardia due to AV blockers. Prolonged Qtc. CAD off dobutamine. Onclopidogrel and rosuvastatin.  Follow up EKG on 01/27 with qtc down to 488.   3. Acute metabolic encephalopathy with anorexia/depression. Today is off restrains, continue confused but not agitated. He has decrease hearing that can make assessment more difficult.  On escitalopram.  4. AKI on stage IV CKD with anion gap metabolic acidosis. renal function with serum cr at 2.0 with K at 3,7 and bicarbonate at 21. Continue close follow up on renal function and electrolytes  Hold on D5 for now.  .  5. OSA. Currently not using Cpap.   6. Right foot second toe ulceration(present on admission).Local wound care  7. HTN.On amlodipine for blood pressure control.   Status is: Inpatient  Remains inpatient appropriate because:Unsafe d/c plan   Dispo: The patient is from: Home              Anticipated d/c is to: Home              Anticipated d/c date is: 2 days               Patient currently is not medically stable to d/c.   Difficult to place patient No   DVT prophylaxis: Enoxaparin   Code Status:   dnr   Family Communication:  I spoke over the phone with the patient's daughter about patient's  condition, plan of care, prognosis and all questions were addressed.    Nutrition Status: Nutrition Problem: Increased nutrient needs Etiology: acute illness (COVID-19 infection) Signs/Symptoms: estimated needs Interventions: Ensure Enlive (each supplement provides 350kcal and 20 grams of protein),MVI,Magic cup    Subjective: Patient continue to be very weak and deconditioned, no further agitation and restrains have been removed.   Objective: Vitals:   12/23/20 1643 12/23/20 1714 12/23/20 2132 12/24/20 0624  BP:   137/Marc 140/72  Pulse:   81 78  Resp:   18 16  Temp:   98.2 F (36.8 C) 97.7 F (36.5 C)  TempSrc:   Oral Oral  SpO2: 96% 96% 90% 92%  Weight:      Height:        Intake/Output Summary (Last 24 hours) at 12/24/2020 1245 Last data filed at 12/24/2020 1130 Gross per 24 hour  Intake 2145.9 ml  Output 1020 ml  Net 1125.9 ml   Filed Weights   12/18/20 1300 12/18/20 1525  Weight: 69 kg 67.5 kg    Examination:   General: deconditioned  Neurology: Awake and alert, non focal  E ENT: mild pallor, no icterus, oral mucosa moist Cardiovascular: No JVD. S1-S2 present, rhythmic, no gallops, rubs, or murmurs. No lower extremity edema. Pulmonary: positive breath sounds bilaterally, adequate air movement, no wheezing, rhonchi or rales. Gastrointestinal. Abdomen soft and non tender Skin. Right toes wounds with dressing in place.  Musculoskeletal: no joint deformities     Data Reviewed: I have personally reviewed following labs and imaging studies  CBC: Recent Labs  Lab 12/17/20 1951 12/19/20 0219 12/20/20 0259 12/21/20 0305 12/22/20 0626 12/23/20 0156  WBC 5.5 6.5 8.3 8.7 8.8 7.8  NEUTROABS 4.4 5.4 7.5 8.1*  --   --   HGB 10.4*  10.3* 9.4* 9.3* 9.2* 9.9*  HCT 31.2* 31.1* 28.2* 27.8* 28.0* 30.2*  MCV 88.1 88.1 87.0 86.3 88.9 89.1  PLT 155 168 169 155 169 [id number]   Basic Metabolic Panel: Recent Labs  Lab 12/18/20 0811 12/19/20 0219 12/20/20 0259 12/21/20 0305 12/22/20 0626 12/23/20 0156 12/24/20 0457  NA  --  139 140 138 137 138 135  K  --  3.8 4.0 4.0 3.4* 3.5 3.7  CL  --  109 109 106 107 109 102  CO2  --  17* 15* 20* 17* 20* 21*  GLUCOSE  --  124* 131* 191* 205* 195* 179*  BUN  --  57* 67* 66* 60* 51* 44*  CREATININE  --  2.40* 2.18* 2.67* 1.97* 2.19* 2.05*  CALCIUM  --  8.1* 8.2* 8.1* 8.0* 8.1* 8.1*  MG 2.0 2.1 2.1 2.3  --   --   --   PHOS 5.0* 4.6 4.4 4.1  --   --   --  GFR: Estimated Creatinine Clearance: 20.9 mL/min (A) (by C-G formula based on SCr of 2.05 mg/dL (H)). Liver Function Tests: Recent Labs  Lab 12/20/20 0259 12/21/20 0305 12/22/20 0626 12/23/20 0156 12/24/20 0457  AST 58* 57* 50* 40 33  ALT 38 37 34 34 32  ALKPHOS 54 60 62 62 59  BILITOT 0.7 0.5 0.7 0.6 0.6  PROT 5.5* 5.6* 5.7* 5.4* 5.5*  ALBUMIN 2.7* 2.8* 2.8* 2.7* 2.7*   No results for input(s): LIPASE, AMYLASE in the last 168 hours. No results for input(s): AMMONIA in the last 168 hours. Coagulation Profile: No results for input(s): INR, PROTIME in the last 168 hours. Cardiac Enzymes: No results for input(s): CKTOTAL, CKMB, CKMBINDEX, TROPONINI in the last 168 hours. BNP (last 3 results) No results for input(s): PROBNP in the last 8760 hours. HbA1C: No results for input(s): HGBA1C in the last 72 hours. CBG: No results for input(s): GLUCAP in the last 168 hours. Lipid Profile: No results for input(s): CHOL, HDL, LDLCALC, TRIG, CHOLHDL, LDLDIRECT in the last 72 hours. Thyroid Function Tests: No results for input(s): TSH, T4TOTAL, FREET4, T3FREE, THYROIDAB in the last 72 hours. Anemia Panel: Recent Labs    12/23/20 0156 12/24/20 0457  FERRITIN 909* 815*      Radiology Studies: I have reviewed all of the  imaging during this hospital visit personally     Scheduled Meds: . allopurinol  200 mg Oral q morning - 10a  . amLODipine  5 mg Oral q morning - 10a  . vitamin C  500 mg Oral Daily  . B-complex with vitamin C  1 tablet Oral Daily  . baricitinib  1 mg Oral Daily  . Chlorhexidine Gluconate Cloth  6 each Topical Daily  . cholecalciferol  1,000 Units Oral q morning - 10a  . clopidogrel  75 mg Oral q morning - 10a  . enoxaparin (LOVENOX) injection  30 mg Subcutaneous Q24H  . escitalopram  20 mg Oral QHS  . famotidine  20 mg Oral q morning - 10a  . feeding supplement  237 mL Oral BID BM  . gabapentin  100 mg Oral QHS  . Ipratropium-Albuterol  1 puff Inhalation BID  . mouth rinse  15 mL Mouth Rinse BID  . methylPREDNISolone (SOLU-MEDROL) injection  60 mg Intravenous Daily  . multivitamin with minerals  1 tablet Oral q morning - 10a  . rosuvastatin  20 mg Oral q morning - 10a  . sodium bicarbonate  650 mg Oral BID  . tamsulosin  0.4 mg Oral QHS  . zinc sulfate  220 mg Oral Daily   Continuous Infusions: . sodium chloride Stopped (12/21/20 2152)  . dextrose 50 mL/hr at 12/23/20 1830     LOS: 7 days        Tzipora Mcinroy Gerome Apley, MD

## 2020-12-25 DIAGNOSIS — U071 COVID-19: Secondary | ICD-10-CM | POA: Diagnosis not present

## 2020-12-25 DIAGNOSIS — Z515 Encounter for palliative care: Secondary | ICD-10-CM | POA: Diagnosis not present

## 2020-12-25 DIAGNOSIS — J9601 Acute respiratory failure with hypoxia: Secondary | ICD-10-CM | POA: Diagnosis not present

## 2020-12-25 DIAGNOSIS — G9341 Metabolic encephalopathy: Secondary | ICD-10-CM | POA: Diagnosis not present

## 2020-12-25 DIAGNOSIS — I714 Abdominal aortic aneurysm, without rupture: Secondary | ICD-10-CM | POA: Diagnosis not present

## 2020-12-25 DIAGNOSIS — J1282 Pneumonia due to coronavirus disease 2019: Secondary | ICD-10-CM

## 2020-12-25 DIAGNOSIS — J189 Pneumonia, unspecified organism: Secondary | ICD-10-CM | POA: Diagnosis not present

## 2020-12-25 DIAGNOSIS — Z7189 Other specified counseling: Secondary | ICD-10-CM | POA: Diagnosis not present

## 2020-12-25 LAB — COMPREHENSIVE METABOLIC PANEL
ALT: 39 U/L (ref 0–44)
AST: 39 U/L (ref 15–41)
Albumin: 2.7 g/dL — ABNORMAL LOW (ref 3.5–5.0)
Alkaline Phosphatase: 67 U/L (ref 38–126)
Anion gap: 10 (ref 5–15)
BUN: 47 mg/dL — ABNORMAL HIGH (ref 8–23)
CO2: 21 mmol/L — ABNORMAL LOW (ref 22–32)
Calcium: 8.2 mg/dL — ABNORMAL LOW (ref 8.9–10.3)
Chloride: 102 mmol/L (ref 98–111)
Creatinine, Ser: 2.1 mg/dL — ABNORMAL HIGH (ref 0.61–1.24)
GFR, Estimated: 30 mL/min — ABNORMAL LOW (ref >60)
Glucose, Bld: 159 mg/dL — ABNORMAL HIGH (ref 70–99)
Potassium: 3.4 mmol/L — ABNORMAL LOW (ref 3.5–5.1)
Sodium: 133 mmol/L — ABNORMAL LOW (ref 135–145)
Total Bilirubin: 0.5 mg/dL (ref 0.3–1.2)
Total Protein: 5.5 g/dL — ABNORMAL LOW (ref 6.5–8.1)

## 2020-12-25 LAB — D-DIMER, QUANTITATIVE: D-Dimer, Quant: 3.51 ug/mL-FEU — ABNORMAL HIGH (ref 0.00–0.50)

## 2020-12-25 LAB — FERRITIN: Ferritin: 688 ng/mL — ABNORMAL HIGH (ref 24–336)

## 2020-12-25 LAB — C-REACTIVE PROTEIN: CRP: 0.9 mg/dL (ref <1.0)

## 2020-12-25 NOTE — Plan of Care (Signed)
  Problem: Safety: Goal: Non-violent Restraint(s) Outcome: Progressing   Problem: Education: Goal: Knowledge of General Education information will improve Description: Including pain rating scale, medication(s)/side effects and non-pharmacologic comfort measures Outcome: Progressing   Problem: Health Behavior/Discharge Planning: Goal: Ability to manage health-related needs will improve Outcome: Progressing   Problem: Clinical Measurements: Goal: Ability to maintain clinical measurements within normal limits will improve Outcome: Progressing Goal: Will remain free from infection Outcome: Progressing Goal: Diagnostic test results will improve Outcome: Progressing Goal: Respiratory complications will improve Outcome: Progressing Goal: Cardiovascular complication will be avoided Outcome: Progressing   Problem: Activity: Goal: Risk for activity intolerance will decrease Outcome: Progressing   Problem: Nutrition: Goal: Adequate nutrition will be maintained Outcome: Progressing   Problem: Coping: Goal: Level of anxiety will decrease Outcome: Progressing   Problem: Elimination: Goal: Will not experience complications related to bowel motility Outcome: Progressing Goal: Will not experience complications related to urinary retention Outcome: Progressing   

## 2020-12-25 NOTE — Progress Notes (Signed)
PROGRESS NOTE    Marc [name]  [id number] DOB: 12/12/1929 DOA: 12/17/2020 PCP: Leonard Downing, MD    Brief Narrative:  Marc [name] was admitted to the hospital with a working diagnosis of acute hypoxic respiratory failure due to SARS COVID-19 viral pneumonia.  87 year old male with past medical history for ischemic cardiomyopathy status post CABG, history of CVA chronic kidney disease stage IV who presented with dyspnea. Patient had several days of generalized weakness and dyspnea, no fevers no chills. Her shortness of breath was progressive and worsening to the point where he called EMS. On his initial physical examination his oximetry was 70% on room air, his blood pressure 131/50, heart rate 69, respiratory 24, oxygenation 99% on supplemental oxygen, 40 L. His lungs were clear to auscultation bilaterally, heart S1-S2, present rhythmic, soft abdomen, no extremity edema. SARS COVID-19 positive.  Chest radiograph with dense bilateral lower lobe infiltrates. EKG 67 bpm, left axis deviation, QTC 544, sinus rhythm, low voltage, no significant ST segment or T wave changes.  Patient with very poor oral intake, has been confused and agitated. With aggressive medical therapy he has improved his oxygen saturation.  Patient with overall decline in his health, his daughter requested to change code status to DNR and continue supportive medical therapy with main focus in avoiding further suffering.   Consulted palliative care team.    Assessment & Plan:   Principal Problem:   Acute hypoxemic respiratory failure due to COVID-19 [facility]) Active Problems:   Coronary artery disease   History of stroke without residual deficits   AAA (abdominal aortic aneurysm) (HCC)   Essential hypertension   CKD (chronic kidney disease) stage 4, GFR 15-29 ml/min (HCC)   Acute metabolic encephalopathy   Acute respiratory failure with hypoxia (HCC)   Pneumonia due to COVID-19 virus   CAP  (community acquired pneumonia)   Severe sinus bradycardia   QT prolongation   CKD (chronic kidney disease), stage IV ([place])   1.Acute hypoxic respiratory failure due to SARS COVID-19 viral pneumonia.  Sp remdesivir #5/5  RR: 16  Pulse oxymetry: 97%  Fi02: 2 L/min per [place]   COVID-19 Labs  Recent Labs    12/23/20 0156 12/24/20 0457 12/25/20 0445  DDIMER 3.73* 3.75* 3.51*  FERRITIN 909* 815* 688*  CRP 2.0* 1.3* 0.9    Lab Results  Component Value Date   SARSCOV2NAA POSITIVE (A) 12/17/2020    [place] lower extremities negative for DVT.   Clinically continue to improve, but he is very weak and deconditioned, continue to be off restrains.    Continue withbaricitinib and methylprednisolone. Bronchodilator therapy, airway clearing techniques, and antitussive agents. Mobility as tolerated.  Continue full dose anticoagulation for now, not able to perform CT angiography due to decreased GFR.   2. Bradycardia due to AV blockers. Prolonged Qtc. CAD off dobutamine. Follow up EKG on 01/27 with qtc down to 488.   No chest pain, continue with clopidogrel and statin therapy.   3. Acute metabolic encephalopathy with anorexia/depression. Mentation continue to improve, decreased hearing is a barrier for communications. No longer on restraoins  Continue withescitalopram.  4. AKI on stage IV CKD with anion gap metabolic acidosis. Stable renal function with serum cr at 2,10 with K at 3,4 and serum bicarbonate at 21.  Continue with oral sodium bicarbonate.  5. OSA. Currently not using Cpap.   6. Right foot second toe ulceration(present on admission).Continue with local wound care  7. HTN.Blood pressure control with amlodipine   Status is:  Inpatient  Remains inpatient appropriate because:Inpatient level of care appropriate due to severity of illness   Dispo: The patient is from: Home              Anticipated d/c is to: SNF              Anticipated d/c date is: 2  days              Patient currently is not medically stable to d/c.   Difficult to place patient No   DVT prophylaxis: Enoxaparin   Code Status:   DNR   Family Communication:  I spoke with his daughter yesterday and all questions were addressed.      Nutrition Status: Nutrition Problem: Increased nutrient needs Etiology: acute illness (COVID-19 infection) Signs/Symptoms: estimated needs Interventions: Ensure Enlive (each supplement provides 350kcal and 20 grams of protein),MVI,Magic cup     Skin Documentation:     Consultants:  Palliative Care  Subjective: Patient is not in distress, continue to be very weak and deconditioned, not agitated. Continue to have poor oral intake.   Objective: Vitals:   12/24/20 1702 12/24/20 2111 12/25/20 0604 12/25/20 1042  BP:  126/78 (!) 141/69   Pulse:  (!) 110 79   Resp:  18 16   Temp:  98.6 F (37 C) 98.1 F (36.7 C)   TempSrc:      SpO2: 92% (!) 88% 97% 94%  Weight:      Height:        Intake/Output Summary (Last 24 hours) at 12/25/2020 1324 Last data filed at 12/25/2020 0600 Gross per 24 hour  Intake 0 ml  Output 400 ml  Net -400 ml   Filed Weights   12/18/20 1300 12/18/20 1525  Weight: 69 kg 67.5 kg    Examination:   General: Not in pain or dyspnea. Deconditioned and ill looking appearing  Neurology: Awake and alert, non focal  E ENT: no pallor, no icterus, oral mucosa moist Cardiovascular: No JVD. S1-S2 present, rhythmic, no gallops, rubs, or murmurs. No lower extremity edema. Pulmonary: positive breath sounds bilaterally,  Gastrointestinal. Abdomen soft and non tender Skin. No rashes Musculoskeletal: no joint deformities     Data Reviewed: I have personally reviewed following labs and imaging studies  CBC: Recent Labs  Lab 12/19/20 0219 12/20/20 0259 12/21/20 0305 12/22/20 0626 12/23/20 0156  WBC 6.5 8.3 8.7 8.8 7.8  NEUTROABS 5.4 7.5 8.1*  --   --   HGB 10.3* 9.4* 9.3* 9.2* 9.9*  HCT 31.1* 28.2*  27.8* 28.0* 30.2*  MCV 88.1 87.0 86.3 88.9 89.1  PLT 168 169 155 169 [id number]   Basic Metabolic Panel: Recent Labs  Lab 12/19/20 0219 12/20/20 0259 12/21/20 0305 12/22/20 0626 12/23/20 0156 12/24/20 0457 12/25/20 0445  NA 139 140 138 137 138 135 133*  K 3.8 4.0 4.0 3.4* 3.5 3.7 3.4*  CL 109 109 106 107 109 102 102  CO2 17* 15* 20* 17* 20* 21* 21*  GLUCOSE 124* 131* 191* 205* 195* 179* 159*  BUN 57* 67* 66* 60* 51* 44* 47*  CREATININE 2.40* 2.18* 2.67* 1.97* 2.19* 2.05* 2.10*  CALCIUM 8.1* 8.2* 8.1* 8.0* 8.1* 8.1* 8.2*  MG 2.1 2.1 2.3  --   --   --   --   PHOS 4.6 4.4 4.1  --   --   --   --    GFR: Estimated Creatinine Clearance: 20.4 mL/min (A) (by C-G formula based on SCr of 2.1 mg/dL (H)).  Liver Function Tests: Recent Labs  Lab 12/21/20 0305 12/22/20 0626 12/23/20 0156 12/24/20 0457 12/25/20 0445  AST 57* 50* 40 33 39  ALT 37 34 34 32 39  ALKPHOS 60 62 62 59 67  BILITOT 0.5 0.7 0.6 0.6 0.5  PROT 5.6* 5.7* 5.4* 5.5* 5.5*  ALBUMIN 2.8* 2.8* 2.7* 2.7* 2.7*   No results for input(s): LIPASE, AMYLASE in the last 168 hours. No results for input(s): AMMONIA in the last 168 hours. Coagulation Profile: No results for input(s): INR, PROTIME in the last 168 hours. Cardiac Enzymes: No results for input(s): CKTOTAL, CKMB, CKMBINDEX, TROPONINI in the last 168 hours. BNP (last 3 results) No results for input(s): PROBNP in the last 8760 hours. HbA1C: No results for input(s): HGBA1C in the last 72 hours. CBG: No results for input(s): GLUCAP in the last 168 hours. Lipid Profile: No results for input(s): CHOL, HDL, LDLCALC, TRIG, CHOLHDL, LDLDIRECT in the last 72 hours. Thyroid Function Tests: No results for input(s): TSH, T4TOTAL, FREET4, T3FREE, THYROIDAB in the last 72 hours. Anemia Panel: Recent Labs    12/24/20 0457 12/25/20 0445  FERRITIN 815* 688*      Radiology Studies: I have reviewed all of the imaging during this hospital visit personally     Scheduled  Meds: . allopurinol  200 mg Oral q morning - 10a  . amLODipine  5 mg Oral q morning - 10a  . vitamin C  500 mg Oral Daily  . B-complex with vitamin C  1 tablet Oral Daily  . baricitinib  1 mg Oral Daily  . Chlorhexidine Gluconate Cloth  6 each Topical Daily  . cholecalciferol  1,000 Units Oral q morning - 10a  . clopidogrel  75 mg Oral q morning - 10a  . enoxaparin (LOVENOX) injection  70 mg Subcutaneous Q24H  . escitalopram  20 mg Oral QHS  . famotidine  20 mg Oral q morning - 10a  . feeding supplement  237 mL Oral BID BM  . gabapentin  100 mg Oral QHS  . Ipratropium-Albuterol  1 puff Inhalation BID  . mouth rinse  15 mL Mouth Rinse BID  . methylPREDNISolone (SOLU-MEDROL) injection  40 mg Intravenous Daily  . multivitamin with minerals  1 tablet Oral q morning - 10a  . rosuvastatin  20 mg Oral q morning - 10a  . sodium bicarbonate  650 mg Oral BID  . tamsulosin  0.4 mg Oral QHS  . zinc sulfate  220 mg Oral Daily   Continuous Infusions: . sodium chloride Stopped (12/21/20 2152)     LOS: 8 days        Adline Kirshenbaum Gerome Apley, MD
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
## 2020-12-26 DIAGNOSIS — G9341 Metabolic encephalopathy: Secondary | ICD-10-CM | POA: Diagnosis not present

## 2020-12-26 DIAGNOSIS — Z7189 Other specified counseling: Secondary | ICD-10-CM | POA: Diagnosis not present

## 2020-12-26 DIAGNOSIS — I714 Abdominal aortic aneurysm, without rupture: Secondary | ICD-10-CM | POA: Diagnosis not present

## 2020-12-26 DIAGNOSIS — J9601 Acute respiratory failure with hypoxia: Secondary | ICD-10-CM | POA: Diagnosis not present

## 2020-12-26 DIAGNOSIS — U071 COVID-19: Secondary | ICD-10-CM | POA: Diagnosis not present

## 2020-12-26 DIAGNOSIS — J189 Pneumonia, unspecified organism: Secondary | ICD-10-CM | POA: Diagnosis not present

## 2020-12-26 LAB — COMPREHENSIVE METABOLIC PANEL
ALT: 34 U/L (ref 0–44)
AST: 36 U/L (ref 15–41)
Albumin: 2.6 g/dL — ABNORMAL LOW (ref 3.5–5.0)
Alkaline Phosphatase: 68 U/L (ref 38–126)
Anion gap: 9 (ref 5–15)
BUN: 49 mg/dL — ABNORMAL HIGH (ref 8–23)
CO2: 23 mmol/L (ref 22–32)
Calcium: 8.2 mg/dL — ABNORMAL LOW (ref 8.9–10.3)
Chloride: 101 mmol/L (ref 98–111)
Creatinine, Ser: 2.24 mg/dL — ABNORMAL HIGH (ref 0.61–1.24)
GFR, Estimated: 27 mL/min — ABNORMAL LOW (ref >60)
Glucose, Bld: 154 mg/dL — ABNORMAL HIGH (ref 70–99)
Potassium: 4 mmol/L (ref 3.5–5.1)
Sodium: 133 mmol/L — ABNORMAL LOW (ref 135–145)
Total Bilirubin: 0.8 mg/dL (ref 0.3–1.2)
Total Protein: 5.4 g/dL — ABNORMAL LOW (ref 6.5–8.1)

## 2020-12-26 LAB — C-REACTIVE PROTEIN: CRP: 0.7 mg/dL (ref <1.0)

## 2020-12-26 LAB — D-DIMER, QUANTITATIVE: D-Dimer, Quant: 3.07 ug/mL-FEU — ABNORMAL HIGH (ref 0.00–0.50)

## 2020-12-26 LAB — FERRITIN: Ferritin: 704 ng/mL — ABNORMAL HIGH (ref 24–336)

## 2020-12-26 MED ORDER — ENOXAPARIN SODIUM 40 MG/0.4ML ~~LOC~~ SOLN: 40.0000 mg | SUBCUTANEOUS | Status: DC

## 2020-12-26 MED ORDER — ENOXAPARIN SODIUM 30 MG/0.3ML ~~LOC~~ SOLN
30.0000 mg | SUBCUTANEOUS | Status: DC
  Administered 2020-12-26: 30 mg via SUBCUTANEOUS
  Filled 2020-12-26: qty 0.3

## 2020-12-26 NOTE — TOC Progression Note (Addendum)
Transition of Care [facility]) - Progression Note    Patient Details  Name: Marc [name] MRN: [id number] Date of Birth: 1929/12/22  [facility] CM/SW Contact  Ross Ludwig, [place] Phone Number: 12/26/2020, 12:44 PM  Clinical Narrative:    CSW was informed that patient's daughter is considering SNF placement for short term rehab.  CSW asked patient's daughter if he has been at SNF before, per patient's daughter, he was at [place] in the past.  CSW explained to patient's daughter that because he is Covid there will be a limited amount of facilities that are able to accept patient.  CSW explained to process to send information out to different facilities, and how insurance will pay for it.  Patient's daughter stated that she wants to see patient before he leaves hospital for SNF.  CSW informed her that hospital policy is that patient can not have visitors.  Per patient's daughter she stated she will talk to the bedside nurse and physician.  Patient's daughter stated that she is 3, CSW informed her that the hospital policy is still no visitors.  Patient's daughter stated that she has talked to palliative team, and CSW informed her that palliative can follow at SNF.  Patient's daughter gave CSW permission to begin bed search, CSW was informed that daughter would prefer [place] if possible.  CSW told her it will depend on bed availability.  CSW to begin bed search in [place] and surrounding counties.  1:15pm  CSW contacted patient's insurance, authorization has been started, reference number [id number], CSW to fax clinicals for insurance approval.   Expected Discharge Plan: Home/Self Care Barriers to Discharge: Continued Medical Work up  Expected Discharge Plan and Services Expected Discharge Plan: Home/Self Care   Discharge Planning Services: CM Consult   Living arrangements for the past 2 months: Single Family Home                                       Social  Determinants of Health (SDOH) Interventions    Readmission Risk Interventions No flowsheet data found.

## 2020-12-26 NOTE — Progress Notes (Signed)
Daily Progress Note   Patient Name: Marc [name]       Date: 12/26/2020 DOB: 1930/03/26  Age: 87 y.o. MRN#: [id number] Attending Physician: Tawni Millers Primary Care Physician: Leonard Downing, MD Admit Date: 12/17/2020  Reason for Consultation/Follow-up: Establishing goals of care  Subjective: I met today with Marc [name] and we were able to call his daughter via video chat so she could see him.  He is off oxygen and is able to participate in conversation with her to some extent.  She reports that he looks better than he did when he came to the hospital for certain, however, he still appears weaker than his normal baseline.  After video visit, I spoke with his daughter on the phone to discuss care plan and options for care when he leaves the hospital.  See below.  Length of Stay: 9  Current Medications: Scheduled Meds:  . allopurinol  200 mg Oral q morning - 10a  . amLODipine  5 mg Oral q morning - 10a  . vitamin C  500 mg Oral Daily  . B-complex with vitamin C  1 tablet Oral Daily  . baricitinib  1 mg Oral Daily  . Chlorhexidine Gluconate Cloth  6 each Topical Daily  . cholecalciferol  1,000 Units Oral q morning - 10a  . clopidogrel  75 mg Oral q morning - 10a  . enoxaparin (LOVENOX) injection  70 mg Subcutaneous Q24H  . escitalopram  20 mg Oral QHS  . famotidine  20 mg Oral q morning - 10a  . feeding supplement  237 mL Oral BID BM  . gabapentin  100 mg Oral QHS  . Ipratropium-Albuterol  1 puff Inhalation BID  . mouth rinse  15 mL Mouth Rinse BID  . methylPREDNISolone (SOLU-MEDROL) injection  40 mg Intravenous Daily  . multivitamin with minerals  1 tablet Oral q morning - 10a  . rosuvastatin  20 mg Oral q morning - 10a  . sodium bicarbonate  650 mg Oral  BID  . tamsulosin  0.4 mg Oral QHS  . zinc sulfate  220 mg Oral Daily    Continuous Infusions: . sodium chloride Stopped (12/21/20 2152)    PRN Meds: acetaminophen, guaiFENesin-dextromethorphan, Ipratropium-Albuterol  Physical Exam  General: Alert, awake, in no acute distress.  Chronically ill appearing  HEENT: No bruits, no goiter, no JVD Heart: Regular rate and rhythm. No murmur appreciated. Lungs: Good air movement, clear Abdomen: Soft, nontender, nondistended, positive bowel sounds.  Ext: No significant edema, toes not examined Skin: Warm and dry Neuro: Grossly intact, nonfocal.          Vital Signs: BP 122/64 (BP Location: Left Arm)   Pulse 77   Temp 98.7 F (37.1 C) (Oral)   Resp 18   Ht 5' 4.5" (1.638 m)   Wt 67.5 kg   SpO2 92%   BMI 25.15 kg/m  SpO2: SpO2: 92 % O2 Device: O2 Device: Nasal Cannula O2 Flow Rate: O2 Flow Rate (L/min): 2 L/min  Intake/output summary:   Intake/Output Summary (Last 24 hours) at 12/26/2020 0945 Last data filed at 12/25/2020 1858 Gross per 24 hour  Intake 240 ml  Output 700 ml  Net -460 ml   LBM: Last BM Date: 12/25/20 Baseline Weight: Weight: 69 kg Most recent weight: Weight: 67.5 kg       Palliative Assessment/Data:    Flowsheet Rows   Flowsheet Row Most Recent Value  Intake Tab   Referral Department Hospitalist  Unit at Time of Referral Med/Surg Unit  Palliative Care Primary Diagnosis Sepsis/Infectious Disease  Date Notified 12/23/20  Palliative Care Type New Palliative care  Reason for referral Clarify Goals of Care  Date of Admission 12/17/20  Date first seen by Palliative Care 12/24/20  # of days Palliative referral response time 1 Day(s)  # of days IP prior to Palliative referral 6  Clinical Assessment   Palliative Performance Scale Score 50%  Psychosocial & Spiritual Assessment   Palliative Care Outcomes   Patient/Family meeting held? Yes  Who was at the meeting? Daughter via phone  Palliative Care  Outcomes Clarified goals of care      Patient Active Problem List   Diagnosis Date Noted  . Acute respiratory failure with hypoxia ([place]) 12/18/2020  . Pneumonia due to COVID-19 virus 12/18/2020  . CAP (community acquired pneumonia) 12/18/2020  . Severe sinus bradycardia 12/18/2020  . QT prolongation 12/18/2020  . CKD (chronic kidney disease), stage IV ([place]) 12/18/2020  . CKD (chronic kidney disease) stage 4, GFR 15-29 ml/min ([facility]) 12/17/2020  . Acute hypoxemic respiratory failure due to COVID-19 ([place]) 12/17/2020  . Acute metabolic encephalopathy 95/28/4132  . Hammer toes of both feet 05/14/2018  . History of amputation of lesser toe of left foot ([place]) 05/14/2018  . Impaired mobility and activities of daily living 04/24/2018  . Cellulitis of left foot 04/09/2018  . Foot callus 03/28/2018  . Nail dystrophy 03/28/2018  . PAD (peripheral artery disease) ([place]) 03/28/2018  . Acquired absence of left great toe ([place]) 02/18/2018  . Diplopia   . INO (internuclear ophthalmoplegia), bilateral   . CVA (cerebral vascular accident) ([place]) 05/04/2017  . Abdominal aortic aneurysm (AAA) ([place]) 04/26/2017  . Atherosclerosis of native arteries of the extremities with intermittent claudication 07/06/2014  . Pain in limb 07/06/2014  . Weakness of both legs 07/06/2014  . Numbness in both legs 07/06/2014  . Acute on chronic renal insufficiency 02/20/2013  . Essential hypertension 12/01/2012  . AAA (abdominal aortic aneurysm) ([place]) 10/28/2012  . Coronary artery disease 08/04/2012  . History of stroke without residual deficits 08/04/2012  . Tobacco dependence 08/04/2012  . Abdominal aneurysm without mention of rupture 01/08/2012  . CLOSTRIDIUM DIFFICILE COLITIS 08/30/2009  . DIVERTICULOSIS-COLON 08/30/2009  . WEIGHT LOSS-ABNORMAL 08/30/2009  . PERSONAL HX COLONIC POLYPS 08/30/2009    Palliative  Care Assessment & Plan   Patient Profile: 87 y.o. male  with past medical history of ischemic  cardiomyopathy status post CABG, CVA, chronic kidney disease stage IV who presented with dyspnea and was subsequently admitted on 12/17/2020 with Covid 19 pneumonia.  He has had very poor oral intake, has been confused, and he has been agitated.  His oxygen saturation has improved since admission.  There has been overall decline in his health over the past several months and his daughter requested further discussion with palliative care team.  Recommendations/Plan:  DNR/DNI  Overall, Mr. Karman continues to have decline in his nutrition, cognition, and functional status over the long run, however, he appears to be improving from this acute illness with Covid.  His daughter is trying to determine next best step for him when he leaves the hospital.  She was hoping that outpatient palliative care will be able to provide in-home care services on a daily basis.  We discussed that this is not really something that is available in the community without privately paying for such services.  She understands there are limited options available for rehab with his Covid positive status.  She would like to discuss further with transition of care team.  I believe that he will likely require discharge to skilled facility for rehab at time of leaving the hospital.  I would recommend outpatient palliative care follow him wherever he discharges.  Code Status:    Code Status Orders  (From admission, onward)         Start     Ordered   12/23/20 1104  Do not attempt resuscitation (DNR)  Continuous       Question Answer Comment  In the event of cardiac or respiratory ARREST Do not call a "code blue"   In the event of cardiac or respiratory ARREST Do not perform Intubation, CPR, defibrillation or ACLS   In the event of cardiac or respiratory ARREST Use medication by any route, position, wound care, and other measures to relive pain and suffering. May use oxygen, suction and manual treatment of airway obstruction  as needed for comfort.      12/23/20 1103        Code Status History    Date Active Date Inactive Code Status Order ID Comments User Context   12/17/2020 2143 12/23/2020 1103 Full Code [id number]  Hillary Bow, DO ED   04/26/2017 1953 04/27/2017 1636 Full Code [id number]  Raymond Gurney, PA-C Inpatient   04/09/2017 1228 04/10/2017 1631 Full Code [id number]  Dara Lords, PA-C Inpatient   08/07/2012 0832 08/10/2012 1353 Full Code [id number]  Delight Ovens, MD Inpatient   08/05/2012 1334 08/07/2012 0832 Full Code [id number]  Ponciano Ort, RN Inpatient   Advance Care Planning Activity    Advance Directive Documentation   Flowsheet Row Most Recent Value  Type of Advance Directive Healthcare Power of Attorney, Living will  Pre-existing out of facility DNR order (yellow form or pink MOST form) --  "MOST" Form in Place? --       Prognosis: Guarded  Discharge Planning:  To Be Determined-I believe he will need skilled facility for rehab at time of discharge  Care plan was discussed with daughter  Thank you for allowing the Palliative Medicine Team to assist in the care of this patient.   Time In: 1210 Time Out: 1250 Total Time 40 Prolonged Time Billed No      Greater than 50%  of this time was  spent counseling and coordinating care related to the above assessment and plan.  Micheline Rough, MD  Please contact Palliative Medicine Team phone at [phone] for questions and concerns.

## 2020-12-26 NOTE — NC FL2 (Signed)
[place] LEVEL OF CARE SCREENING TOOL     IDENTIFICATION  Patient Name: Marc [name] Birthdate: 1930/05/12 Sex: male Admission Date (Current Location): 12/17/2020  [facility] and [place] Number:  Herbalist and Address:  [facility],  [place], [place]      Provider Number: [id number]  Attending Physician Name and Address:  Tawni Millers,*  Relative Name and Phone Number:  Signa Kell Daughter [phone] or Thomas,(SIL)Jerry Other [phone]    Current Level of Care: Hospital Recommended Level of Care: [place] Prior Approval Number:    Date Approved/Denied:   PASRR Number: [id number]  Discharge Plan: SNF    Current Diagnoses: Patient Active Problem List   Diagnosis Date Noted  . Acute respiratory failure with hypoxia ([place]) 12/18/2020  . Pneumonia due to COVID-19 virus 12/18/2020  . CAP (community acquired pneumonia) 12/18/2020  . Severe sinus bradycardia 12/18/2020  . QT prolongation 12/18/2020  . CKD (chronic kidney disease), stage IV ([place]) 12/18/2020  . CKD (chronic kidney disease) stage 4, GFR 15-29 ml/min (HCC) 12/17/2020  . Acute hypoxemic respiratory failure due to COVID-19 ([place]) 12/17/2020  . Acute metabolic encephalopathy [id number]  . Hammer toes of both feet 05/14/2018  . History of amputation of lesser toe of left foot ([place]) 05/14/2018  . Impaired mobility and activities of daily living 04/24/2018  . Cellulitis of left foot 04/09/2018  . Foot callus 03/28/2018  . Nail dystrophy 03/28/2018  . PAD (peripheral artery disease) ([place]) 03/28/2018  . Acquired absence of left great toe ([place]) 02/18/2018  . Diplopia   . INO (internuclear ophthalmoplegia), bilateral   . CVA (cerebral vascular accident) ([place]) 05/04/2017  . Abdominal aortic aneurysm (AAA) ([place]) 04/26/2017  . Atherosclerosis of native arteries of the extremities with intermittent claudication 07/06/2014  . Pain  in limb 07/06/2014  . Weakness of both legs 07/06/2014  . Numbness in both legs 07/06/2014  . Acute on chronic renal insufficiency 02/20/2013  . Essential hypertension 12/01/2012  . AAA (abdominal aortic aneurysm) ([place]) 10/28/2012  . Coronary artery disease 08/04/2012  . History of stroke without residual deficits 08/04/2012  . Tobacco dependence 08/04/2012  . Abdominal aneurysm without mention of rupture 01/08/2012  . CLOSTRIDIUM DIFFICILE COLITIS 08/30/2009  . DIVERTICULOSIS-COLON 08/30/2009  . WEIGHT LOSS-ABNORMAL 08/30/2009  . PERSONAL HX COLONIC POLYPS 08/30/2009    Orientation RESPIRATION BLADDER Height & Weight     Self,Situation  O2 (2L) Incontinent Weight: 148 lb 13 oz (67.5 kg) Height:  5' 4.5" (163.8 cm)  BEHAVIORAL SYMPTOMS/MOOD NEUROLOGICAL BOWEL NUTRITION STATUS      Incontinent Diet (Regular)  AMBULATORY STATUS COMMUNICATION OF NEEDS Skin   Limited Assist Verbally Other (Comment) (Diabetic ulcer twice a day change.)                       Personal Care Assistance Level of Assistance  Bathing,Dressing,Feeding Bathing Assistance: Limited assistance Feeding assistance: Limited assistance Dressing Assistance: Limited assistance     Functional Limitations Info  Sight,Hearing,Speech Sight Info: Adequate Hearing Info: Adequate Speech Info: Adequate    SPECIAL CARE FACTORS FREQUENCY  PT (By licensed PT),OT (By licensed OT)     PT Frequency: 5x a week OT Frequency: 5x a week            Contractures Contractures Info: Not present    Additional Factors Info  Code Status,Allergies,Psychotropic,Isolation Precautions Code Status Info: DNR Allergies Info: NKA Psychotropic Info: escitalopram (LEXAPRO) tablet 20 mg  Isolation Precautions Info: Air/Con precautions due to Covid     Current Medications (12/26/2020):  This is the current hospital active medication list Current Facility-Administered Medications  Medication Dose Route Frequency Provider  Last Rate Last Admin  . 0.9 %  sodium chloride infusion  1,000 mL Intravenous Continuous Allie Bossier, MD   Paused at 12/21/20 2152  . acetaminophen (TYLENOL) tablet 650 mg  650 mg Oral Q6H PRN Etta Quill, DO   650 mg at 12/17/20 2252  . allopurinol (ZYLOPRIM) tablet 200 mg  200 mg Oral q morning - 10a Jennette Kettle M, DO   200 mg at 12/26/20 1017  . amLODipine (NORVASC) tablet 5 mg  5 mg Oral q morning - 10a Etta Quill, DO   5 mg at 12/26/20 1015  . ascorbic acid (VITAMIN C) tablet 500 mg  500 mg Oral Daily Allie Bossier, MD   500 mg at 12/26/20 1017  . B-complex with vitamin C tablet 1 tablet  1 tablet Oral Daily Jennette Kettle M, DO   1 tablet at 12/26/20 1017  . baricitinib (OLUMIANT) tablet 1 mg  1 mg Oral Daily Jennette Kettle M, DO   1 mg at 12/26/20 1015  . Chlorhexidine Gluconate Cloth 2 % PADS 6 each  6 each Topical Daily Allie Bossier, MD   6 each at 12/24/20 1208  . cholecalciferol (VITAMIN D3) tablet 1,000 Units  1,000 Units Oral q morning - 10a Etta Quill, DO   1,000 Units at 12/26/20 1017  . clopidogrel (PLAVIX) tablet 75 mg  75 mg Oral q morning - 10a Etta Quill, DO   75 mg at 12/26/20 1016  . enoxaparin (LOVENOX) injection 70 mg  70 mg Subcutaneous Q24H Tawni Millers, MD   70 mg at 12/25/20 1549  . escitalopram (LEXAPRO) tablet 20 mg  20 mg Oral QHS Jennette Kettle M, DO   20 mg at 12/25/20 2054  . famotidine (PEPCID) tablet 20 mg  20 mg Oral q morning - 10a Jennette Kettle M, DO   20 mg at 12/26/20 1018  . feeding supplement (ENSURE ENLIVE / ENSURE PLUS) liquid 237 mL  237 mL Oral BID BM Arrien, Jimmy Picket, MD   237 mL at 12/26/20 1016  . gabapentin (NEURONTIN) capsule 100 mg  100 mg Oral QHS Jennette Kettle M, DO   100 mg at 12/25/20 2054  . guaiFENesin-dextromethorphan (ROBITUSSIN DM) 100-10 MG/5ML syrup 5 mL  5 mL Oral Q4H PRN Arrien, Jimmy Picket, MD      . Ipratropium-Albuterol (COMBIVENT) respimat 1 puff  1 puff Inhalation BID  Arrien, Jimmy Picket, MD   1 puff at 12/26/20 [phone]  . Ipratropium-Albuterol (COMBIVENT) respimat 1 puff  1 puff Inhalation Q6H PRN Arrien, Jimmy Picket, MD      . MEDLINE mouth rinse  15 mL Mouth Rinse BID Allie Bossier, MD   15 mL at 12/26/20 1018  . methylPREDNISolone sodium succinate (SOLU-MEDROL) 125 mg/2 mL injection 40 mg  40 mg Intravenous Daily Arrien, Jimmy Picket, MD   40 mg at 12/26/20 1019  . multivitamin with minerals tablet 1 tablet  1 tablet Oral q morning - 10a Etta Quill, DO   1 tablet at 12/26/20 1017  . rosuvastatin (CRESTOR) tablet 20 mg  20 mg Oral q morning - 10a Jennette Kettle M, DO   20 mg at 12/26/20 1019  . sodium bicarbonate tablet 650 mg  650 mg Oral BID Etta Quill,  DO   650 mg at 12/26/20 1017  . tamsulosin (FLOMAX) capsule 0.4 mg  0.4 mg Oral QHS Jennette Kettle M, DO   0.4 mg at 12/25/20 2054  . zinc sulfate capsule 220 mg  220 mg Oral Daily Allie Bossier, MD   220 mg at 12/26/20 1016     Discharge Medications: Please see discharge summary for a list of discharge medications.  Relevant Imaging Results:  Relevant Lab Results:   Additional Information SSN [id number]  Ross Ludwig, LCSW

## 2020-12-26 NOTE — Progress Notes (Signed)
PROGRESS NOTE    DEDRIC [name]  [id number] DOB: 09/12/1930 DOA: 12/17/2020 PCP: Leonard Downing, MD    Brief Narrative:  Marc [name] was admitted to the hospital with a working diagnosis of acute hypoxic respiratory failure due to SARS COVID-19 viral pneumonia.  87 year old male with past medical history for ischemic cardiomyopathy status post CABG, history of CVA chronic kidney disease stage IV who presented with dyspnea. Patient had several days of generalized weakness and dyspnea, no fevers and no chills. His shortness of breath was progressive and worsening to the point where he called EMS. On his initial physical examination his oximetry was 70% on room air, his blood pressure 131/50, heart rate 69, respiratory 24, oxygenation 99% on supplemental oxygen, 40 L. His lungs were clear to auscultation bilaterally, heart S1-S2, present rhythmic, soft abdomen, no extremity edema. SARS COVID-19 positive.  Chest radiograph with dense bilateral lower lobe infiltrates. EKG 67 bpm, left axis deviation, QTC 544, sinus rhythm, low voltage, no significant ST segment or T wave changes.  Patient with very poor oral intake, has been confused and agitated. With aggressive medical therapy he has improved his oxygen saturation.  Patient with overall decline in his health, his daughter requested to change code status to DNR and continue supportive medical therapy with main focus in avoiding further suffering.   Consulted palliative care team.  Oxygenation has improved along with his po intake, but he continue to be very weak and deconditioned.   Plan to transfer to SNF when bed available.   Assessment & Plan:   Principal Problem:   Acute hypoxemic respiratory failure due to COVID-19 [facility]) Active Problems:   Coronary artery disease   History of stroke without residual deficits   AAA (abdominal aortic aneurysm) (HCC)   Essential hypertension   CKD (chronic kidney disease) stage  4, GFR 15-29 ml/min (HCC)   Acute metabolic encephalopathy   Acute respiratory failure with hypoxia (HCC)   Pneumonia due to COVID-19 virus   CAP (community acquired pneumonia)   Severe sinus bradycardia   QT prolongation   CKD (chronic kidney disease), stage IV ([place])    1.Acute hypoxic respiratory failure due to SARS COVID-19 viral pneumonia.  Sp remdesivir #5/5  RR: 16  Pulse oxymetry: 90 to 92%  Fi02: 21% room air.   COVID-19 Labs  Recent Labs    12/24/20 0457 12/25/20 0445 12/26/20 0425  DDIMER 3.75* 3.51* 3.07*  FERRITIN 815* 688* 704*  CRP 1.3* 0.9 0.7    Lab Results  Component Value Date   SARSCOV2NAA POSITIVE (A) 12/17/2020     [place] lower extremities negative for DVT.  Slowly improving with decreased oxygen requirements.  Tolerating wellbaricitinib #9/14 doses and methylprednisolone for 9/10 days. Continue with bronchodilator therapy, airway clearing techniques, and antitussive agents. Mobility as tolerated, he is very weak and deconditioned, will need SNF.   D dimer trending down, oxygenation is improving, will transition to prophylactic doses of enoxaparin.   2. Bradycardia due to AV blockers. Prolonged Qtc. CAD off dobutamine. Follow up EKG on 01/27 with qtc down to 488.  On clopidogrel and statin therapy.   3. Acute metabolic encephalopathy with anorexia/depression. Continue to be very weak and deconditioned, but he has been off restrain with no agitation.  Onescitalopram.  4. AKI on stage IV CKD with anion gap metabolic acidosis. Renal function with serum cr at 2,2 with K at 4,0 and bicarbonate at 23. Discontinue sodium bicarbonate  Follow up renal function in 48 hrs.  5. OSA. Currently not using Cpap.   6. Right foot second toe ulceration(present on admission). Local wound care  7. HTN.Continue with amlodipine for blood pressure control.    Status is: Inpatient  Remains inpatient appropriate because:Inpatient  level of care appropriate due to severity of illness   Dispo: The patient is from: Home              Anticipated d/c is to: SNF              Anticipated d/c date is: 1 day              Patient currently is medically stable to d/c.   Difficult to place patient No   DVT prophylaxis:  enoxaparin   Code Status:   DNR   Family Communication:  I spoke over the phone with the patient's daghter about patient's  condition, plan of care, prognosis and all questions were addressed.    Nutrition Status: Nutrition Problem: Increased nutrient needs Etiology: acute illness (COVID-19 infection) Signs/Symptoms: estimated needs Interventions: Ensure Enlive (each supplement provides 350kcal and 20 grams of protein),MVI,Magic cup     Subjective: Patient is stable continue to be confused but not agitated, no nausea or vomiting, tolerating po well, off restrains, continue to be very weak and deconditioned,   Objective: Vitals:   12/25/20 1356 12/25/20 2240 12/26/20 0528 12/26/20 1210  BP: 119/68 131/67 122/64   Pulse: 98 88 77   Resp:  16 18   Temp: 98.1 F (36.7 C) 98.1 F (36.7 C) 98.7 F (37.1 C)   TempSrc: Oral Oral Oral   SpO2: 93% 90% 92% (!) 84%  Weight:      Height:        Intake/Output Summary (Last 24 hours) at 12/26/2020 1327 Last data filed at 12/25/2020 1858 Gross per 24 hour  Intake 240 ml  Output 700 ml  Net -460 ml   Filed Weights   12/18/20 1300 12/18/20 1525  Weight: 69 kg 67.5 kg    Examination:   General: Not in pain or dyspnea  Neurology: Awake and alert, non focal. Confused and disorientated.  E ENT: no pallor, no icterus, oral mucosa moist Cardiovascular: No JVD. S1-S2 present, rhythmic, no gallops, rubs, or murmurs. No lower extremity edema. Pulmonary: positive breath sounds bilaterally, scattered rales.  Gastrointestinal. Abdomen soft and non tender Skin. No rashes Musculoskeletal: no joint deformities     Data Reviewed: I have personally reviewed  following labs and imaging studies  CBC: Recent Labs  Lab 12/20/20 0259 12/21/20 0305 12/22/20 0626 12/23/20 0156  WBC 8.3 8.7 8.8 7.8  NEUTROABS 7.5 8.1*  --   --   HGB 9.4* 9.3* 9.2* 9.9*  HCT 28.2* 27.8* 28.0* 30.2*  MCV 87.0 86.3 88.9 89.1  PLT 169 155 169 [id number]   Basic Metabolic Panel: Recent Labs  Lab 12/20/20 0259 12/21/20 0305 12/22/20 0626 12/23/20 0156 12/24/20 0457 12/25/20 0445 12/26/20 0425  NA 140 138 137 138 135 133* 133*  K 4.0 4.0 3.4* 3.5 3.7 3.4* 4.0  CL 109 106 107 109 102 102 101  CO2 15* 20* 17* 20* 21* 21* 23  GLUCOSE 131* 191* 205* 195* 179* 159* 154*  BUN 67* 66* 60* 51* 44* 47* 49*  CREATININE 2.18* 2.67* 1.97* 2.19* 2.05* 2.10* 2.24*  CALCIUM 8.2* 8.1* 8.0* 8.1* 8.1* 8.2* 8.2*  MG 2.1 2.3  --   --   --   --   --   PHOS 4.4 4.1  --   --   --   --   --  GFR: Estimated Creatinine Clearance: 19.1 mL/min (A) (by C-G formula based on SCr of 2.24 mg/dL (H)). Liver Function Tests: Recent Labs  Lab 12/22/20 0626 12/23/20 0156 12/24/20 0457 12/25/20 0445 12/26/20 0425  AST 50* 40 33 39 36  ALT 34 34 32 39 34  ALKPHOS 62 62 59 67 68  BILITOT 0.7 0.6 0.6 0.5 0.8  PROT 5.7* 5.4* 5.5* 5.5* 5.4*  ALBUMIN 2.8* 2.7* 2.7* 2.7* 2.6*   No results for input(s): LIPASE, AMYLASE in the last 168 hours. No results for input(s): AMMONIA in the last 168 hours. Coagulation Profile: No results for input(s): INR, PROTIME in the last 168 hours. Cardiac Enzymes: No results for input(s): CKTOTAL, CKMB, CKMBINDEX, TROPONINI in the last 168 hours. BNP (last 3 results) No results for input(s): PROBNP in the last 8760 hours. HbA1C: No results for input(s): HGBA1C in the last 72 hours. CBG: No results for input(s): GLUCAP in the last 168 hours. Lipid Profile: No results for input(s): CHOL, HDL, LDLCALC, TRIG, CHOLHDL, LDLDIRECT in the last 72 hours. Thyroid Function Tests: No results for input(s): TSH, T4TOTAL, FREET4, T3FREE, THYROIDAB in the last 72  hours. Anemia Panel: Recent Labs    12/25/20 0445 12/26/20 0425  FERRITIN 688* 58*      Radiology Studies: I have reviewed all of the imaging during this hospital visit personally     Scheduled Meds: . allopurinol  200 mg Oral q morning - 10a  . amLODipine  5 mg Oral q morning - 10a  . vitamin C  500 mg Oral Daily  . B-complex with vitamin C  1 tablet Oral Daily  . baricitinib  1 mg Oral Daily  . Chlorhexidine Gluconate Cloth  6 each Topical Daily  . cholecalciferol  1,000 Units Oral q morning - 10a  . clopidogrel  75 mg Oral q morning - 10a  . enoxaparin (LOVENOX) injection  70 mg Subcutaneous Q24H  . escitalopram  20 mg Oral QHS  . famotidine  20 mg Oral q morning - 10a  . feeding supplement  237 mL Oral BID BM  . gabapentin  100 mg Oral QHS  . Ipratropium-Albuterol  1 puff Inhalation BID  . mouth rinse  15 mL Mouth Rinse BID  . methylPREDNISolone (SOLU-MEDROL) injection  40 mg Intravenous Daily  . multivitamin with minerals  1 tablet Oral q morning - 10a  . rosuvastatin  20 mg Oral q morning - 10a  . sodium bicarbonate  650 mg Oral BID  . tamsulosin  0.4 mg Oral QHS  . zinc sulfate  220 mg Oral Daily   Continuous Infusions: . sodium chloride Stopped (12/21/20 2152)     LOS: 9 days        Karry Barrilleaux Gerome Apley, MD

## 2020-12-26 NOTE — TOC Progression Note (Signed)
Transition of Care [facility]) - Progression Note    Patient Details  Name: Marc [name] MRN: [id number] Date of Birth: 1930-08-15  Transition of Care [facility]) CM/SW Contact  Ross Ludwig, [place] Phone Number: 12/26/2020, 4:44 PM  Clinical Narrative:    CSW spoke to patient's daughter and provided bed offers, she is going to review bed offers and let CSW know which facility she chooses.  CSW started insurance authorization.   Expected Discharge Plan: Home/Self Care Barriers to Discharge: Continued Medical Work up  Expected Discharge Plan and Services Expected Discharge Plan: Home/Self Care   Discharge Planning Services: CM Consult   Living arrangements for the past 2 months: Single Family Home                                       Social Determinants of Health (SDOH) Interventions    Readmission Risk Interventions No flowsheet data found.

## 2020-12-26 NOTE — Progress Notes (Signed)
Physical Therapy Treatment Patient Details Name: Marc [name] MRN: [id number] DOB: February 06, 1930 Today's Date: 12/26/2020    History of Present Illness 87 y.o. male with PMH of CKD 4, CAD s/p CABG, prior stroke. pt with several day h/o weakness and SOB, tested + for COVID    PT Comments    Mod assist supine to sit, min A to pivot with RW to recliner. SaO2 84% at rest on room air, 88% on 4L O2 with walking, HR 123 walking. Instructed pt in seated BUE/LE strengthening exercises. Pt oriented to self only. He was incontinent of urine upon standing.    Follow Up Recommendations  SNF     Equipment Recommendations  None recommended by PT    Recommendations for Other Services       Precautions / Restrictions Precautions Precautions: Fall Precaution Comments: monitor sats Restrictions Weight Bearing Restrictions: No    Mobility  Bed Mobility Overal bed mobility: Needs Assistance Bed Mobility: Supine to Sit     Supine to sit: Mod assist;HOB elevated     General bed mobility comments: to assist with LEs and to elevate trunk to sitting  Transfers Overall transfer level: Needs assistance Equipment used: Rolling walker (2 wheeled) Transfers: Sit to/from Stand Sit to Stand: Mod assist         General transfer comment: cues for LE management and use of UEs to self assist.  Physical assist to bring wt up and fwd and to balance in standing.  Ambulation/Gait Ambulation/Gait assistance: Min guard;Min assist Gait Distance (Feet): 3 Feet Assistive device: Rolling walker (2 wheeled) Gait Pattern/deviations: Step-to pattern;Decreased step length - right;Decreased step length - left;Shuffle;Trunk flexed Gait velocity: decr   General Gait Details: cues for posture and position from RW.  Physical assist for balance/support and RW management.  Distance ltd by fatigue/SOB. SaO2 84% on room air at rest, 92% 4L O2 [place] rest, 88% 4L with walking, HR 118.   Stairs              Wheelchair Mobility    Modified Rankin (Stroke Patients Only)       Balance Overall balance assessment: Needs assistance Sitting-balance support: Feet supported Sitting balance-Leahy Scale: Fair     Standing balance support: Bilateral upper extremity supported Standing balance-Leahy Scale: Poor Standing balance comment: reliant on UE support and external assistance                            Cognition Arousal/Alertness: Awake/alert Behavior During Therapy: Flat affect Overall Cognitive Status: No family/caregiver present to determine baseline cognitive functioning Area of Impairment: Orientation;Following commands;Safety/judgement;Problem solving;Memory                 Orientation Level: Disoriented to;Place;Time;Situation     Following Commands: Follows one step commands with increased time Safety/Judgement: Decreased awareness of deficits;Decreased awareness of safety   Problem Solving: Slow processing;Requires verbal cues;Requires tactile cues General Comments: patient cooperative but confused, oriented to self only      Exercises General Exercises - Lower Extremity Long Arc Quad: AROM;Both;10 reps;Seated Hip Flexion/Marching: AROM;Both;10 reps;Seated Shoulder Exercises Shoulder Flexion: AROM;Both;10 reps;Seated    General Comments General comments (skin integrity, edema, etc.): upon arrival to room pt had O2 out of nose, SaO2 84% on room air, 92% on 4L O2 at rest, 88% on 4L with walking, HR 118 walking      Pertinent Vitals/Pain Faces Pain Scale: Hurts a little bit Pain Location: generalized, pt did  not respond when asked location of pain Pain Descriptors / Indicators: Moaning Pain Intervention(s): Limited activity within patient's tolerance;Monitored during session;Repositioned    Home Living                      Prior Function            PT Goals (current goals can now be found in the care plan section) Acute Rehab PT  Goals Patient Stated Goal: none stated PT Goal Formulation: Patient unable to participate in goal setting Time For Goal Achievement: 01/05/21 Potential to Achieve Goals: Good Progress towards PT goals: Progressing toward goals    Frequency    Min 3X/week      PT Plan Current plan remains appropriate    Co-evaluation              AM-PAC PT "6 Clicks" Mobility   Outcome Measure  Help needed turning from your back to your side while in a flat bed without using bedrails?: A Lot Help needed moving from lying on your back to sitting on the side of a flat bed without using bedrails?: A Lot Help needed moving to and from a bed to a chair (including a wheelchair)?: A Little Help needed standing up from a chair using your arms (e.g., wheelchair or bedside chair)?: A Little Help needed to walk in hospital room?: A Lot Help needed climbing 3-5 steps with a railing? : Total 6 Click Score: 13    End of Session Equipment Utilized During Treatment: Gait belt;Oxygen Activity Tolerance: Patient limited by fatigue Patient left: in chair;with call bell/phone within reach;with chair alarm set Nurse Communication: Mobility status (condom catheter fell off) PT Visit Diagnosis: Unsteadiness on feet (R26.81);Muscle weakness (generalized) (M62.81);Difficulty in walking, not elsewhere classified (R26.2)     Time: [id number] PT Time Calculation (min) (ACUTE ONLY): 30 min  Charges:  $Therapeutic Exercise: 8-22 mins $Therapeutic Activity: 8-22 mins                    Blondell Reveal Kistler PT 12/26/2020  Acute Rehabilitation Services Pager [phone] Office [phone]

## 2020-12-26 NOTE — Plan of Care (Signed)
  Problem: Safety: Goal: Non-violent Restraint(s) Outcome: Progressing   Problem: Education: Goal: Knowledge of General Education information will improve Description: Including pain rating scale, medication(s)/side effects and non-pharmacologic comfort measures Outcome: Progressing   Problem: Health Behavior/Discharge Planning: Goal: Ability to manage health-related needs will improve Outcome: Progressing   Problem: Clinical Measurements: Goal: Ability to maintain clinical measurements within normal limits will improve Outcome: Progressing Goal: Will remain free from infection Outcome: Progressing Goal: Diagnostic test results will improve Outcome: Progressing Goal: Respiratory complications will improve Outcome: Progressing Goal: Cardiovascular complication will be avoided Outcome: Progressing   

## 2020-12-26 NOTE — Care Management Important Message (Signed)
Important Message  Patient Details IM Letter placed in Patient's door Caddy. Name: Marc [name] MRN: [id number] Date of Birth: 1930/03/15   Medicare Important Message Given:  Yes     Kerin Salen 12/26/2020, 1:23 PM

## 2020-12-26 NOTE — Progress Notes (Signed)
Bladder scan at  1750. Showed greater than 351m. In and out cath produced 6077moutput.

## 2020-12-27 DIAGNOSIS — U071 COVID-19: Secondary | ICD-10-CM | POA: Diagnosis not present

## 2020-12-27 DIAGNOSIS — G9341 Metabolic encephalopathy: Secondary | ICD-10-CM | POA: Diagnosis not present

## 2020-12-27 DIAGNOSIS — J9601 Acute respiratory failure with hypoxia: Secondary | ICD-10-CM | POA: Diagnosis not present

## 2020-12-27 DIAGNOSIS — I714 Abdominal aortic aneurysm, without rupture: Secondary | ICD-10-CM | POA: Diagnosis not present

## 2020-12-27 LAB — CREATININE, SERUM
Creatinine, Ser: 2.06 mg/dL — ABNORMAL HIGH (ref 0.61–1.24)
GFR, Estimated: 30 mL/min — ABNORMAL LOW (ref >60)

## 2020-12-27 MED ORDER — IPRATROPIUM-ALBUTEROL 20-100 MCG/ACT IN AERS: 1.0000 | INHALATION_SPRAY | Freq: Four times a day (QID) | RESPIRATORY_TRACT | 0 refills | Status: AC | PRN

## 2020-12-27 MED ORDER — GUAIFENESIN-DM 100-10 MG/5ML PO SYRP: 5.0000 mL | ORAL_SOLUTION | Freq: Four times a day (QID) | ORAL | 0 refills | Status: AC | PRN

## 2020-12-27 MED ORDER — ENSURE ENLIVE PO LIQD: 237.0000 mL | Freq: Two times a day (BID) | ORAL | 0 refills | Status: AC

## 2020-12-27 NOTE — Discharge Summary (Signed)
Physician Discharge Summary  Marc [name] [id number] DOB: 04-29-1930 DOA: 12/17/2020  PCP: Marc Downing, MD  Admit date: 12/17/2020 Discharge date: 12/27/2020  Admitted From: Home  Disposition:  SNF   Recommendations for Outpatient Follow-up and new medication changes:  1. Follow up with Dr. Arelia [name] in 2 weeks.  2. Follow as outpatient with palliative care.   I spoke over the phone with the patient's daughter about patient's  condition, plan of care, prognosis and all questions were addressed.  Home Health: no   Equipment/Devices: na    Discharge Condition: stable  CODE STATUS: DNR   Diet recommendation:  Regular diet.   Brief/Interim Summary: Marc [name] was admitted to the hospital with a working diagnosis of acute hypoxic respiratory failure due to SARS COVID-19 viral pneumonia.  87 year old male with past medical history for ischemic cardiomyopathy status post CABG, history of CVA chronic kidney disease stage IV who presented with dyspnea. Patient had several days of generalized weakness and dyspnea, no fevers and no chills. His shortness of breath was progressive and worsening to the point where he called EMS. On his initial physical examination his oximetry was 70% on room air, his blood pressure 131/50, heart rate 69, respiratory rate 24, oxygenation 99% on supplemental oxygen, 40 L. His lungs were clear to auscultation bilaterally, heart S1-S2, present rhythmic, soft abdomen, no extremity edema. SARS COVID-19 positive.  Sodium 136 potassium 3.9, chloride 103, bicarb 20, glucose 125, BUN 46, creatinine 2.95, white count 5.5, hemoglobin 10.4, hematocrit 31.2, platelets 155.  Chest radiograph with dense bilateral lower lobe infiltrates. EKG 67 bpm, left axis deviation, QTC 544, sinus rhythm, low voltage, no significant ST segment or T wave changes.  He was placed on medical therapy with systemic steroids and remdesivir. Supplemental 02 per [place]  Patient with  very poor oral intake, had confusion and agitation. With aggressive medical therapy he has improved his oxygen saturation.  Patient with overall decline in his health, his daughter requested to change code status to DNR and continue supportive medical therapy with main focus in avoiding further suffering.   Consulted palliative care team.  Oxygenation has improved along with his po intake, but he continue to be very weak and deconditioned.   Plan to transfer to SNF.  1.  Acute hypoxic respiratory failure due to SARS COVID-19 viral pneumonia. Patient had a prolonged hospital stay, initially admitted to the stepdown unit due to high oxygen requirements. He received medical therapy with remdesivir, systemic corticosteroids and baricitinib.  Slowly his inflammatory markers, oxygenation and symptoms improved.  COVID-19 Labs  Recent Labs    12/25/20 0445 12/26/20 0425  DDIMER 3.51* 3.07*  FERRITIN 688* 704*  CRP 0.9 0.7    Lab Results  Component Value Date   SARSCOV2NAA POSITIVE (A) 12/17/2020    During his hospitalization he received therapeutic anticoagulation for elevated D-dimer. Further work-up with ultrasonography lower extremities negative for deep vein thrombosis. He was transitioned to subcutaneous enoxaparin with good toleration.  His oxygenation at discharge is 92% on 2 L of supplemental oxygen per nasal cannula.  2.  Bradycardia due to AV blockade, prolonged QTC, coronary artery disease.  Patient required dobutamine infusion for symptomatic bradycardia. Follow-up EKG 01/27 with QTC 488. Patient remained chest pain-free, continue clopidogrel and statin therapy.  3.  Acute metabolic encephalopathy with anorexia and depression.  His mentation has been improving, slowly getting back to his baseline. His decreased hearing affects his ability to communicate. Continue escitalopram.   4.  Acute  kidney injury on chronic kidney disease stage IV, anion gap metabolic  acidosis.  Patient received supportive medical therapy, electrolytes were corrected. His discharge creatinine was 2.0, sodium 133, potassium 4.0, chloride 1 1, bicarb 23.  5.  Obstructive sleep apnea.  Currently not using CPAP.  6.  Right foot second toe ulceration.  Present on admission.  Continue local wound care.  7.  Hypertension.  His blood pressure remained well controlled with amlodipine.   Discharge Diagnoses:  Principal Problem:   Acute hypoxemic respiratory failure due to COVID-19 [facility]) Active Problems:   Coronary artery disease   History of stroke without residual deficits   AAA (abdominal aortic aneurysm) (HCC)   Essential hypertension   CKD (chronic kidney disease) stage 4, GFR 15-29 ml/min (HCC)   Acute metabolic encephalopathy   Acute respiratory failure with hypoxia (HCC)   Pneumonia due to COVID-19 virus   CAP (community acquired pneumonia)   Severe sinus bradycardia   QT prolongation   CKD (chronic kidney disease), stage IV [facility])    Discharge Instructions  Discharge Instructions    Diet - low sodium heart healthy   Complete by: As directed    Discharge instructions   Complete by: As directed    Please follow up with primary care in 2 weeks.   Discharge wound care:   Complete by: As directed    Wound care to right foot, second digit chronic wound. Cleanse with NS, pat dry. Pain with betadine swabstick and allow to air dry. Place foot/ feet into [employer].   Increase activity slowly   Complete by: As directed    MyChart COVID-19 home monitoring program   Complete by: Dec 27, 2020    Is the patient willing to use the [place] for home monitoring?: Yes     Allergies as of 12/27/2020   No Known Allergies     Medication List    TAKE these medications   acetaminophen 500 MG tablet Commonly known as: TYLENOL Take 500 mg by mouth at bedtime.   allopurinol 100 MG tablet Commonly known as: ZYLOPRIM Take 200 mg by mouth every morning.    amLODipine 5 MG tablet Commonly known as: NORVASC Take 5 mg by mouth every morning.   cholecalciferol 1000 units tablet Commonly known as: VITAMIN D Take 1,000 Units by mouth every morning.   clopidogrel 75 MG tablet Commonly known as: PLAVIX Take 1 tablet (75 mg total) by mouth daily. What changed: when to take this   escitalopram 20 MG tablet Commonly known as: LEXAPRO Take 20 mg by mouth at bedtime.   famotidine 20 MG tablet Commonly known as: PEPCID Take 20 mg by mouth every morning.   feeding supplement Liqd Take 237 mLs by mouth 2 (two) times daily between meals.   gabapentin 100 MG capsule Commonly known as: NEURONTIN Take 100 mg by mouth at bedtime.   guaiFENesin-dextromethorphan 100-10 MG/5ML syrup Commonly known as: ROBITUSSIN DM Take 5 mLs by mouth every 6 (six) hours as needed for cough.   Ipratropium-Albuterol 20-100 MCG/ACT Aers respimat Commonly known as: COMBIVENT Inhale 1 puff into the lungs every 6 (six) hours as needed for wheezing.   metoprolol tartrate 50 MG tablet Commonly known as: LOPRESSOR Take 25 mg by mouth every morning.   multivitamin with minerals Tabs tablet Take 1 tablet by mouth every morning. Centrum silver 50 plus   rosuvastatin 20 MG tablet Commonly known as: CRESTOR Take 1 tablet (20 mg total) by mouth daily at  6 PM. What changed: when to take this   sodium bicarbonate 650 MG tablet Take 650 mg by mouth See admin instructions. Take one tablet (650 mg) by mouth twice daily (take evening dose 2 hours before gabapentin)   tamsulosin 0.4 MG Caps capsule Commonly known as: FLOMAX Take 0.4 mg by mouth at bedtime.   Vitamin B Complex Tabs Take 1 tablet by mouth every morning.            Discharge Care Instructions  (From admission, onward)         Start     Ordered   12/27/20 0000  Discharge wound care:       Comments: Wound care to right foot, second digit chronic wound. Cleanse with NS, pat dry. Pain with betadine  swabstick and allow to air dry. Place foot/ feet into [employer].   12/27/20 1154          No Known Allergies   Procedures/Studies: DG Chest Port 1 View  Result Date: 12/17/2020 CLINICAL DATA:  Shortness of breath for 3 days EXAM: PORTABLE CHEST 1 VIEW COMPARISON:  04/18/2018 FINDINGS: Single frontal view of the chest demonstrates patchy bilateral airspace disease greatest at the right lung base. No effusion or pneumothorax. Cardiac silhouette is mildly enlarged, likely accentuated by portable AP technique. Postsurgical changes from CABG. IMPRESSION: 1. Bibasilar airspace disease, right greater than left. Favor multifocal pneumonia over edema. Electronically Signed   By: Randa Ngo M.D.   On: 12/17/2020 19:25   VAS [place] LOWER EXTREMITY VENOUS (DVT)  Result Date: 12/21/2020  Lower Venous DVT Study Indications: Elevated Ddimer.  Risk Factors: COVID 19 positive. Limitations: Poor ultrasound/tissue interface and patient positioning, patient constant movement, poor patient cooperation. Comparison Study: No prior studies. Performing Technologist: Oliver Hum RVT  Examination Guidelines: A complete evaluation includes B-mode imaging, spectral Doppler, color Doppler, and power Doppler as needed of all accessible portions of each vessel. Bilateral testing is considered an integral part of a complete examination. Limited examinations for reoccurring indications may be performed as noted. The reflux portion of the exam is performed with the patient in reverse Trendelenburg.  +---------+---------------+---------+-----------+----------+-------------------+ RIGHT    CompressibilityPhasicitySpontaneityPropertiesThrombus Aging      +---------+---------------+---------+-----------+----------+-------------------+ CFV      Full           Yes      Yes                                      +---------+---------------+---------+-----------+----------+-------------------+ SFJ      Full                                                              +---------+---------------+---------+-----------+----------+-------------------+ FV Prox  Full                                                             +---------+---------------+---------+-----------+----------+-------------------+ FV Mid   Full                                                             +---------+---------------+---------+-----------+----------+-------------------+  FV DistalFull                                                             +---------+---------------+---------+-----------+----------+-------------------+ PFV      Full                                                             +---------+---------------+---------+-----------+----------+-------------------+ POP      Full           Yes      Yes                                      +---------+---------------+---------+-----------+----------+-------------------+ PTV      Full                                                             +---------+---------------+---------+-----------+----------+-------------------+ PERO                                                  Not well visualized +---------+---------------+---------+-----------+----------+-------------------+ Incidental finding of an occluded SFA. Unable to assess further due to the patient's constant movement, positioning, and poor copperation.  +---------+---------------+---------+-----------+----------+-------------------+ LEFT     CompressibilityPhasicitySpontaneityPropertiesThrombus Aging      +---------+---------------+---------+-----------+----------+-------------------+ CFV      Full           Yes      Yes                                      +---------+---------------+---------+-----------+----------+-------------------+ SFJ      Full                                                              +---------+---------------+---------+-----------+----------+-------------------+ FV Prox  Full                                                             +---------+---------------+---------+-----------+----------+-------------------+ FV Mid   Full                                                             +---------+---------------+---------+-----------+----------+-------------------+  FV DistalFull                                                             +---------+---------------+---------+-----------+----------+-------------------+ PFV      Full                                                             +---------+---------------+---------+-----------+----------+-------------------+ POP      Full           Yes      Yes                                      +---------+---------------+---------+-----------+----------+-------------------+ PTV      Full                                                             +---------+---------------+---------+-----------+----------+-------------------+ PERO                                                  Not well visualized +---------+---------------+---------+-----------+----------+-------------------+    Summary: RIGHT: - There is no evidence of deep vein thrombosis in the lower extremity. However, portions of this examination were limited- see technologist comments above.  - No cystic structure found in the popliteal fossa. - Incidental finding of an occluded SFA.  LEFT: - There is no evidence of deep vein thrombosis in the lower extremity. However, portions of this examination were limited- see technologist comments above.  - No cystic structure found in the popliteal fossa.  *See table(s) above for measurements and observations. Electronically signed by Harold Barban MD on 12/21/2020 at 9:39:00 PM.    Final    ECHOCARDIOGRAM LIMITED  Result Date: 12/19/2020    ECHOCARDIOGRAM LIMITED REPORT   Patient Name:   Marc [name] Date of Exam: 12/19/2020 Medical Rec #:  [id number]       Height:       64.5 in Accession #:    [id number]      Weight:       148.8 lb Date of Birth:  10/04/1930       BSA:          1.735 m Patient Age:    74 years        BP:           114/42 mmHg Patient Gender: M               HR:           94 bpm. Exam Location:  Inpatient Procedure: Limited Echo and Limited Color Doppler Indications:    R94.31 Abnormal EKG  History:        Patient has prior history  of Echocardiogram examinations, most                 recent 04/23/2017. COVID-19 Positive.  Sonographer:    Tiffany Dance Referring Phys: [id number] [place] Comments: Technically difficult study due to poor echo windows, no apical window, suboptimal subcostal window, suboptimal parasternal window and Technically challenging study due to limited acoustic windows. IMPRESSIONS  1. Study is non-diagnostic as unable to visualize cardiac structures. Recommend different imaging modality (TEE vs MRI) if clinically indicated. FINDINGS  Left Ventricle: Study is non-diagnostic as unable to visualize cardiac structures. Recommend different imaging modality (TEE vs MRI) if clinically indicated. Gwyndolyn Kaufman MD Electronically signed by Gwyndolyn Kaufman MD Signature Date/Time: 12/19/2020/4:05:17 PM    Final         Subjective: Patient is feeling well, continue to be very weak and deconditioned, poor oral intake and decreased mobility.   Discharge Exam: Vitals:   12/26/20 2105 12/27/20 0705  BP: (!) 150/68 136/70  Pulse: 84 76  Resp: 18 18  Temp: 98.1 F (36.7 C) (!) 97.4 F (36.3 C)  SpO2: 90% 92%   Vitals:   12/26/20 1210 12/26/20 1414 12/26/20 2105 12/27/20 0705  BP:  123/71 (!) 150/68 136/70  Pulse:  97 84 76  Resp:  '19 18 18  '$ Temp:  97.8 F (36.6 C) 98.1 F (36.7 C) (!) 97.4 F (36.3 C)  TempSrc:  Oral Oral Oral  SpO2: (!) 84% 95% 90% 92%  Weight:      Height:        General: Not in pain or dyspnea. Neurology:  Awake and alert, non focal. Mild confusion but not agitation.  E ENT: mild pallor, no icterus, oral mucosa moist Cardiovascular: No JVD. S1-S2 present, rhythmic, no gallops, rubs, or murmurs. No lower extremity edema. Pulmonary: positive breath sounds bilaterally,scattered rhonchi bilaterally Gastrointestinal. Abdomen soft and non tender Skin. No rashes Musculoskeletal: no joint deformities   The results of significant diagnostics from this hospitalization (including imaging, microbiology, ancillary and laboratory) are listed below for reference.     Microbiology: Recent Results (from the past 240 hour(s))  SARS Coronavirus 2 by RT PCR (hospital order, performed in [facility] hospital lab) Nasopharyngeal Nasopharyngeal Swab     Status: Abnormal   Collection Time: 12/17/20  7:51 PM   Specimen: Nasopharyngeal Swab  Result Value Ref Range Status   SARS Coronavirus 2 POSITIVE (A) NEGATIVE Final    Comment: RESULT CALLED TO, READ BACK BY AND VERIFIED WITH: Montel Culver @ 2214 12/17/20 BY SJT (NOTE) SARS-CoV-2 target nucleic acids are DETECTED  SARS-CoV-2 RNA is generally detectable in upper respiratory specimens  during the acute phase of infection.  Positive results are indicative  of the presence of the identified virus, but do not rule out bacterial infection or co-infection with other pathogens not detected by the test.  Clinical correlation with patient history and  other diagnostic information is necessary to determine patient infection status.  The expected result is negative.  Fact Sheet for Patients:   StrictlyIdeas.no   Fact Sheet for Healthcare Providers:   BankingDealers.co.za    This test is not yet approved or cleared by the [place] FDA and  has been authorized for detection and/or diagnosis of SARS-CoV-2 by FDA under an Emergency Use Authorization (EUA).  This EUA will remain in effect (meaning this  test can be  used) for the duration of  the COVID-19 declaration under Section 564(b)(1) of the Act, 21 U.S.C. section  360-bbb-3(b)(1), unless the authorization is terminated or revoked sooner.  Performed at [facility], [place] [address] [place], [place] [address]   Blood Culture (routine x 2)     Status: None   Collection Time: 12/17/20  7:51 PM   Specimen: Right Antecubital; Blood  Result Value Ref Range Status   Specimen Description   Final    RIGHT ANTECUBITAL Performed at [place] [address], [place], [place] [address]    Special Requests   Final    BOTTLES DRAWN AEROBIC AND ANAEROBIC Blood Culture results may not be optimal due to an excessive volume of blood received in culture bottles Performed at [facility] [address], [place], [place] [address]    Culture   Final    NO GROWTH 5 DAYS Performed at [facility], [place] [address], [place], [place] [address]    Report Status 12/22/2020 FINAL  Final  Blood Culture (routine x 2)     Status: Abnormal   Collection Time: 12/17/20  7:51 PM   Specimen: Left Antecubital; Blood  Result Value Ref Range Status   Specimen Description   Final    LEFT ANTECUBITAL Performed at [facility] [address] [place], [place] [address]    Special Requests   Final    BOTTLES DRAWN AEROBIC AND ANAEROBIC Blood Culture adequate volume Performed at [facility] [address] [place], [place] [address]    Culture  Setup Time   Final    GRAM POSITIVE COCCI IN CLUSTERS AEROBIC BOTTLE ONLY CRITICAL RESULT CALLED TO, READ BACK BY AND VERIFIED WITH: J. WOFFORD,PHARMD [id number] 12/20/2020 T. TYSOR    Culture (A)  Final    STAPHYLOCOCCUS EPIDERMIDIS THE SIGNIFICANCE OF ISOLATING THIS ORGANISM FROM A SINGLE SET OF BLOOD CULTURES WHEN MULTIPLE SETS ARE DRAWN IS UNCERTAIN. PLEASE NOTIFY THE MICROBIOLOGY DEPARTMENT WITHIN ONE WEEK IF SPECIATION AND  SENSITIVITIES ARE REQUIRED. Performed at [facility], [place] [address], [place], [place] [address]    Report Status 12/21/2020 FINAL  Final  Blood Culture ID Panel (Reflexed)     Status: Abnormal   Collection Time: 12/17/20  7:51 PM  Result Value Ref Range Status   Enterococcus faecalis NOT DETECTED NOT DETECTED Final   Enterococcus Faecium NOT DETECTED NOT DETECTED Final   Listeria monocytogenes NOT DETECTED NOT DETECTED Final   Staphylococcus species DETECTED (A) NOT DETECTED Final    Comment: CRITICAL RESULT CALLED TO, READ BACK BY AND VERIFIED WITH: J. WOFFORD,PHARMD 0620 12/20/2020 T. TYSOR    Staphylococcus aureus (BCID) NOT DETECTED NOT DETECTED Final   Staphylococcus epidermidis DETECTED (A) NOT DETECTED Final    Comment: Methicillin (oxacillin) resistant coagulase negative staphylococcus. Possible blood culture contaminant (unless isolated from more than one blood culture draw or clinical case suggests pathogenicity). No antibiotic treatment is indicated for blood  culture contaminants. CRITICAL RESULT CALLED TO, READ BACK BY AND VERIFIED WITH: J. WOFFORD,PHARMD 0620 12/20/2020 T. TYSOR    Staphylococcus lugdunensis NOT DETECTED NOT DETECTED Final   Streptococcus species NOT DETECTED NOT DETECTED Final   Streptococcus agalactiae NOT DETECTED NOT DETECTED Final   Streptococcus pneumoniae NOT DETECTED NOT DETECTED Final   Streptococcus pyogenes NOT DETECTED NOT DETECTED Final   A.calcoaceticus-baumannii NOT DETECTED NOT DETECTED Final   Bacteroides fragilis NOT DETECTED NOT DETECTED Final   Enterobacterales NOT DETECTED NOT DETECTED Final   Enterobacter cloacae complex NOT DETECTED NOT DETECTED Final   Escherichia  coli NOT DETECTED NOT DETECTED Final   Klebsiella aerogenes NOT DETECTED NOT DETECTED Final   Klebsiella oxytoca NOT DETECTED NOT DETECTED Final   Klebsiella pneumoniae NOT DETECTED NOT DETECTED Final   Proteus species NOT DETECTED NOT DETECTED Final    Salmonella species NOT DETECTED NOT DETECTED Final   Serratia marcescens NOT DETECTED NOT DETECTED Final   Haemophilus influenzae NOT DETECTED NOT DETECTED Final   Neisseria meningitidis NOT DETECTED NOT DETECTED Final   Pseudomonas aeruginosa NOT DETECTED NOT DETECTED Final   Stenotrophomonas maltophilia NOT DETECTED NOT DETECTED Final   Candida albicans NOT DETECTED NOT DETECTED Final   Candida auris NOT DETECTED NOT DETECTED Final   Candida glabrata NOT DETECTED NOT DETECTED Final   Candida krusei NOT DETECTED NOT DETECTED Final   Candida parapsilosis NOT DETECTED NOT DETECTED Final   Candida tropicalis NOT DETECTED NOT DETECTED Final   Cryptococcus neoformans/gattii NOT DETECTED NOT DETECTED Final   Methicillin resistance mecA/C DETECTED (A) NOT DETECTED Final    Comment: CRITICAL RESULT CALLED TO, READ BACK BY AND VERIFIED WITH: J. WOFFORD,PHARMD [id number] 12/20/2020 Mena Goes Performed at [facility], [address] [place], [place] [address]   MRSA PCR Screening     Status: None   Collection Time: 12/18/20  3:31 PM   Specimen: Nasal Mucosa; Nasopharyngeal  Result Value Ref Range Status   MRSA by PCR NEGATIVE NEGATIVE Final    Comment:        The GeneXpert MRSA Assay (FDA approved for NASAL specimens only), is one component of a comprehensive MRSA colonization surveillance program. It is not intended to diagnose MRSA infection nor to guide or monitor treatment for MRSA infections. Performed at [facility], [place] [address], [place], [place] [address]      Labs: BNP (last 3 results) No results for input(s): BNP in the last 8760 hours. Basic Metabolic Panel: Recent Labs  Lab 12/21/20 0305 12/22/20 0626 12/23/20 0156 12/24/20 0457 12/25/20 0445 12/26/20 0425 12/27/20 0420  NA 138 137 138 135 133* 133*  --   K 4.0 3.4* 3.5 3.7 3.4* 4.0  --   CL 106 107 109 102 102 101  --   CO2 20* 17* 20* 21* 21* 23  --   GLUCOSE 191* 205* 195* 179* 159*  154*  --   BUN 66* 60* 51* 44* 47* 49*  --   CREATININE 2.67* 1.97* 2.19* 2.05* 2.10* 2.24* 2.06*  CALCIUM 8.1* 8.0* 8.1* 8.1* 8.2* 8.2*  --   MG 2.3  --   --   --   --   --   --   PHOS 4.1  --   --   --   --   --   --    Liver Function Tests: Recent Labs  Lab 12/22/20 0626 12/23/20 0156 12/24/20 0457 12/25/20 0445 12/26/20 0425  AST 50* 40 33 39 36  ALT 34 34 32 39 34  ALKPHOS 62 62 59 67 68  BILITOT 0.7 0.6 0.6 0.5 0.8  PROT 5.7* 5.4* 5.5* 5.5* 5.4*  ALBUMIN 2.8* 2.7* 2.7* 2.7* 2.6*   No results for input(s): LIPASE, AMYLASE in the last 168 hours. No results for input(s): AMMONIA in the last 168 hours. CBC: Recent Labs  Lab 12/21/20 0305 12/22/20 0626 12/23/20 0156  WBC 8.7 8.8 7.8  NEUTROABS 8.1*  --   --   HGB 9.3* 9.2* 9.9*  HCT 27.8* 28.0* 30.2*  MCV 86.3 88.9 89.1  PLT 155  169 155   Cardiac Enzymes: No results for input(s): CKTOTAL, CKMB, CKMBINDEX, TROPONINI in the last 168 hours. BNP: Invalid input(s): POCBNP CBG: No results for input(s): GLUCAP in the last 168 hours. D-Dimer Recent Labs    12/25/20 0445 12/26/20 0425  DDIMER 3.51* 3.07*   Hgb A1c No results for input(s): HGBA1C in the last 72 hours. Lipid Profile No results for input(s): CHOL, HDL, LDLCALC, TRIG, CHOLHDL, LDLDIRECT in the last 72 hours. Thyroid function studies No results for input(s): TSH, T4TOTAL, T3FREE, THYROIDAB in the last 72 hours.  Invalid input(s): FREET3 Anemia work up Recent Labs    12/25/20 0445 12/26/20 0425  FERRITIN 688* 704*   Urinalysis    Component Value Date/Time   COLORURINE YELLOW 05/04/2017 [place] 05/04/2017 2339   LABSPEC 1.014 05/04/2017 2339   PHURINE 7.0 05/04/2017 [place] 05/04/2017 2339   HGBUR NEGATIVE 05/04/2017 2339   BILIRUBINUR NEGATIVE 05/04/2017 2339   KETONESUR NEGATIVE 05/04/2017 2339   PROTEINUR NEGATIVE 05/04/2017 2339   UROBILINOGEN 0.2 09/23/2012 1201   NITRITE NEGATIVE 05/04/2017 2339    LEUKOCYTESUR NEGATIVE 05/04/2017 2339   Sepsis Labs Invalid input(s): PROCALCITONIN,  WBC,  LACTICIDVEN Microbiology Recent Results (from the past 240 hour(s))  SARS Coronavirus 2 by RT PCR (hospital order, performed in [place] hospital lab) Nasopharyngeal Nasopharyngeal Swab     Status: Abnormal   Collection Time: 12/17/20  7:51 PM   Specimen: Nasopharyngeal Swab  Result Value Ref Range Status   SARS Coronavirus 2 POSITIVE (A) NEGATIVE Final    Comment: RESULT CALLED TO, READ BACK BY AND VERIFIED WITH: Montel Culver @ 2214 12/17/20 BY SJT (NOTE) SARS-CoV-2 target nucleic acids are DETECTED  SARS-CoV-2 RNA is generally detectable in upper respiratory specimens  during the acute phase of infection.  Positive results are indicative  of the presence of the identified virus, but do not rule out bacterial infection or co-infection with other pathogens not detected by the test.  Clinical correlation with patient history and  other diagnostic information is necessary to determine patient infection status.  The expected result is negative.  Fact Sheet for Patients:   StrictlyIdeas.no   Fact Sheet for Healthcare Providers:   BankingDealers.co.za    This test is not yet approved or cleared by the [place] FDA and  has been authorized for detection and/or diagnosis of SARS-CoV-2 by FDA under an Emergency Use Authorization (EUA).  This EUA will remain in effect (meaning this  test can be used) for the duration of  the COVID-19 declaration under Section 564(b)(1) of the Act, 21 U.S.C. section 360-bbb-3(b)(1), unless the authorization is terminated or revoked sooner.  Performed at [facility], [place] [address], [place], [place] [address]   Blood Culture (routine x 2)     Status: None   Collection Time: 12/17/20  7:51 PM   Specimen: Right Antecubital; Blood  Result Value Ref Range Status   Specimen Description    Final    RIGHT ANTECUBITAL Performed at [facility] [address], [place], [place] [address]    Special Requests   Final    BOTTLES DRAWN AEROBIC AND ANAEROBIC Blood Culture results may not be optimal due to an excessive volume of blood received in culture bottles Performed at [facility] [address], [place],  [address]    Culture   Final    NO GROWTH 5 DAYS Performed at [facility], [address] [place], [place] [address]    Report Status 12/22/2020 FINAL  Final  Blood Culture (routine x 2)     Status: Abnormal   Collection Time: 12/17/20  7:51 PM   Specimen: Left Antecubital; Blood  Result Value Ref Range Status   Specimen Description   Final    LEFT ANTECUBITAL Performed at [facility] [address], [place], [place] [address]    Special Requests   Final    BOTTLES DRAWN AEROBIC AND ANAEROBIC Blood Culture adequate volume Performed at [facility] [address] [place], [place] [address]    Culture  Setup Time   Final    GRAM POSITIVE COCCI IN CLUSTERS AEROBIC BOTTLE ONLY CRITICAL RESULT CALLED TO, READ BACK BY AND VERIFIED WITH: J. WOFFORD,PHARMD [id number] 12/20/2020 T. TYSOR    Culture (A)  Final    STAPHYLOCOCCUS EPIDERMIDIS THE SIGNIFICANCE OF ISOLATING THIS ORGANISM FROM A SINGLE SET OF BLOOD CULTURES WHEN MULTIPLE SETS ARE DRAWN IS UNCERTAIN. PLEASE NOTIFY THE MICROBIOLOGY DEPARTMENT WITHIN ONE WEEK IF SPECIATION AND SENSITIVITIES ARE REQUIRED. Performed at [facility], [place] [address], [place], [place] [address]    Report Status 12/21/2020 FINAL  Final  Blood Culture ID Panel (Reflexed)     Status: Abnormal   Collection Time: 12/17/20  7:51 PM  Result Value Ref Range Status   Enterococcus faecalis NOT DETECTED NOT DETECTED Final   Enterococcus Faecium NOT DETECTED NOT DETECTED Final   Listeria monocytogenes NOT DETECTED NOT DETECTED Final   Staphylococcus  species DETECTED (A) NOT DETECTED Final    Comment: CRITICAL RESULT CALLED TO, READ BACK BY AND VERIFIED WITH: J. WOFFORD,PHARMD 0620 12/20/2020 T. TYSOR    Staphylococcus aureus (BCID) NOT DETECTED NOT DETECTED Final   Staphylococcus epidermidis DETECTED (A) NOT DETECTED Final    Comment: Methicillin (oxacillin) resistant coagulase negative staphylococcus. Possible blood culture contaminant (unless isolated from more than one blood culture draw or clinical case suggests pathogenicity). No antibiotic treatment is indicated for blood  culture contaminants. CRITICAL RESULT CALLED TO, READ BACK BY AND VERIFIED WITH: J. WOFFORD,PHARMD 0620 12/20/2020 T. TYSOR    Staphylococcus lugdunensis NOT DETECTED NOT DETECTED Final   Streptococcus species NOT DETECTED NOT DETECTED Final   Streptococcus agalactiae NOT DETECTED NOT DETECTED Final   Streptococcus pneumoniae NOT DETECTED NOT DETECTED Final   Streptococcus pyogenes NOT DETECTED NOT DETECTED Final   A.calcoaceticus-baumannii NOT DETECTED NOT DETECTED Final   Bacteroides fragilis NOT DETECTED NOT DETECTED Final   Enterobacterales NOT DETECTED NOT DETECTED Final   Enterobacter cloacae complex NOT DETECTED NOT DETECTED Final   Escherichia coli NOT DETECTED NOT DETECTED Final   Klebsiella aerogenes NOT DETECTED NOT DETECTED Final   Klebsiella oxytoca NOT DETECTED NOT DETECTED Final   Klebsiella pneumoniae NOT DETECTED NOT DETECTED Final   Proteus species NOT DETECTED NOT DETECTED Final   Salmonella species NOT DETECTED NOT DETECTED Final   Serratia marcescens NOT DETECTED NOT DETECTED Final   Haemophilus influenzae NOT DETECTED NOT DETECTED Final   Neisseria meningitidis NOT DETECTED NOT DETECTED Final   Pseudomonas aeruginosa NOT DETECTED NOT DETECTED Final   Stenotrophomonas maltophilia NOT DETECTED NOT DETECTED Final   Candida albicans NOT DETECTED NOT DETECTED Final   Candida auris NOT DETECTED NOT DETECTED Final   Candida glabrata NOT  DETECTED NOT DETECTED Final   Candida krusei NOT DETECTED NOT DETECTED Final   Candida parapsilosis NOT DETECTED NOT DETECTED Final   Candida tropicalis NOT DETECTED NOT  DETECTED Final   Cryptococcus neoformans/gattii NOT DETECTED NOT DETECTED Final   Methicillin resistance mecA/C DETECTED (A) NOT DETECTED Final    Comment: CRITICAL RESULT CALLED TO, READ BACK BY AND VERIFIED WITH: J. WOFFORD,PHARMD [id number] 12/20/2020 Mena Goes Performed at [facility], [address], [place], [place] [address]   MRSA PCR Screening     Status: None   Collection Time: 12/18/20  3:31 PM   Specimen: Nasal Mucosa; Nasopharyngeal  Result Value Ref Range Status   MRSA by PCR NEGATIVE NEGATIVE Final    Comment:        The GeneXpert MRSA Assay (FDA approved for NASAL specimens only), is one component of a comprehensive MRSA colonization surveillance program. It is not intended to diagnose MRSA infection nor to guide or monitor treatment for MRSA infections. Performed at [facility], [place] [address] [place], [place] [address]      Time coordinating discharge: 45 minutes  SIGNED:   Tawni Millers, MD  [facility] 12/27/2020, 11:54 AM

## 2020-12-27 NOTE — TOC Progression Note (Addendum)
Transition of Care [facility]) - Progression Note    Patient Details  Name: MYCHAEL HOLROYD MRN: [id number] Date of Birth: 08-02-1930  [facility] CM/SW Contact  Ross Ludwig, [place] Phone Number: 12/27/2020, 12:17 PM  Clinical Narrative:    CSW received a phone call from patient's daughter yesterday, and she stated she would like [facility] if possible, if not [employer].  CSW was informed that patient can go to [facility] as long as patient's resting oxygen is below 4L of oxygen.  CSW checked with bedside nurse, and she stated that patient is on 2L of oxygen resting.  CSW updated [facility].  Friend can accept patient today pending insurance authorization.  CSW attempted to contact patient's daughter [name], to confirm bed choice, CSW had to leave a message on voice mail, awaiting for a call back.  12:30pm  CSW spoke to [employer], patient has been approved for [facility] for short term rehab.  CSW spoke to patient's daughter and informed her that patient may be ready for discharge today depending on what the physician decides.   Expected Discharge Plan: Home/Self Care Barriers to Discharge: Continued Medical Work up  Expected Discharge Plan and Services Expected Discharge Plan: Home/Self Care   Discharge Planning Services: CM Consult   Living arrangements for the past 2 months: Single Family Home Expected Discharge Date: 12/27/20                                     Social Determinants of Health (SDOH) Interventions    Readmission Risk Interventions No flowsheet data found.

## 2020-12-27 NOTE — Progress Notes (Signed)
Daily Progress Note   Patient Name: Marc [name]       Date: 12/27/2020 DOB: July 01, 1930  Age: 87 y.o. MRN#: [id number] Attending Physician: Tawni Millers Primary Care Physician: Leonard Downing, MD Admit Date: 12/17/2020  Reason for Consultation/Follow-up: Establishing goals of care  Subjective: I met today with Marc [name] and we were able to call his daughter via video chat so she could see him.  He remains tired but otherwise states he is feeling well.  His daughter is concerned about his lack of intake over the past couple of days.  She feels that he does much better with hand foods such as sandwiches rather than getting him a plate of food that either requires him to do more and pick up and eat something.  She also reports he does not usually do well with being fed.  Length of Stay: 10  Current Medications: Scheduled Meds:  . allopurinol  200 mg Oral q morning - 10a  . amLODipine  5 mg Oral q morning - 10a  . vitamin C  500 mg Oral Daily  . B-complex with vitamin C  1 tablet Oral Daily  . baricitinib  1 mg Oral Daily  . Chlorhexidine Gluconate Cloth  6 each Topical Daily  . cholecalciferol  1,000 Units Oral q morning - 10a  . clopidogrel  75 mg Oral q morning - 10a  . enoxaparin (LOVENOX) injection  30 mg Subcutaneous Q24H  . escitalopram  20 mg Oral QHS  . famotidine  20 mg Oral q morning - 10a  . feeding supplement  237 mL Oral BID BM  . gabapentin  100 mg Oral QHS  . Ipratropium-Albuterol  1 puff Inhalation BID  . mouth rinse  15 mL Mouth Rinse BID  . methylPREDNISolone (SOLU-MEDROL) injection  40 mg Intravenous Daily  . multivitamin with minerals  1 tablet Oral q morning - 10a  . rosuvastatin  20 mg Oral q morning - 10a  . tamsulosin  0.4 mg Oral QHS   . zinc sulfate  220 mg Oral Daily    Continuous Infusions: . sodium chloride Stopped (12/21/20 2152)    PRN Meds: acetaminophen, guaiFENesin-dextromethorphan, Ipratropium-Albuterol  Physical Exam  General: Alert, awake, in no acute distress.  Chronically ill appearing   HEENT: No bruits, no goiter, no  JVD Heart: Regular rate and rhythm. No murmur appreciated. Lungs: Good air movement, clear Abdomen: Soft, nontender, nondistended, positive bowel sounds.  Ext: No significant edema, toes not examined Skin: Warm and dry Neuro: Grossly intact, nonfocal.          Vital Signs: BP (!) 150/68 (BP Location: Right Arm)   Pulse 84   Temp 98.1 F (36.7 C) (Oral)   Resp 18   Ht 5' 4.5" (1.638 m)   Wt 67.5 kg   SpO2 90%   BMI 25.15 kg/m  SpO2: SpO2: 90 % O2 Device: O2 Device: Nasal Cannula O2 Flow Rate: O2 Flow Rate (L/min): 2 L/min  Intake/output summary:   Intake/Output Summary (Last 24 hours) at 12/27/2020 0548 Last data filed at 12/26/2020 1800 Gross per 24 hour  Intake --  Output 600 ml  Net -600 ml   LBM: Last BM Date: 12/25/20 Baseline Weight: Weight: 69 kg Most recent weight: Weight: 67.5 kg       Palliative Assessment/Data:    Flowsheet Rows   Flowsheet Row Most Recent Value  Intake Tab   Referral Department Hospitalist  Unit at Time of Referral Med/Surg Unit  Palliative Care Primary Diagnosis Sepsis/Infectious Disease  Date Notified 12/23/20  Palliative Care Type New Palliative care  Reason for referral Clarify Goals of Care  Date of Admission 12/17/20  Date first seen by Palliative Care 12/24/20  # of days Palliative referral response time 1 Day(s)  # of days IP prior to Palliative referral 6  Clinical Assessment   Palliative Performance Scale Score 50%  Psychosocial & Spiritual Assessment   Palliative Care Outcomes   Patient/Family meeting held? Yes  Who was at the meeting? Daughter via phone  Palliative Care Outcomes Clarified goals of care       Patient Active Problem List   Diagnosis Date Noted  . Acute respiratory failure with hypoxia ([place]) 12/18/2020  . Pneumonia due to COVID-19 virus 12/18/2020  . CAP (community acquired pneumonia) 12/18/2020  . Severe sinus bradycardia 12/18/2020  . QT prolongation 12/18/2020  . CKD (chronic kidney disease), stage IV ([place]) 12/18/2020  . CKD (chronic kidney disease) stage 4, GFR 15-29 ml/min (H[facility]) 12/17/2020  . Acute hypoxemic respiratory failure due to COVID-19 ([place]) 12/17/2020  . Acute metabolic encephalopathy 39/76/7341  . Hammer toes of both feet 05/14/2018  . History of amputation of lesser toe of left foot ([place]) 05/14/2018  . Impaired mobility and activities of daily living 04/24/2018  . Cellulitis of left foot 04/09/2018  . Foot callus 03/28/2018  . Nail dystrophy 03/28/2018  . PAD (peripheral artery disease) ([place]) 03/28/2018  . Acquired absence of left great toe ([place]) 02/18/2018  . Diplopia   . INO (internuclear ophthalmoplegia), bilateral   . CVA (cerebral vascular accident) ([place]) 05/04/2017  . Abdominal aortic aneurysm (AAA) ([place]) 04/26/2017  . Atherosclerosis of native arteries of the extremities with intermittent claudication 07/06/2014  . Pain in limb 07/06/2014  . Weakness of both legs 07/06/2014  . Numbness in both legs 07/06/2014  . Acute on chronic renal insufficiency 02/20/2013  . Essential hypertension 12/01/2012  . AAA (abdominal aortic aneurysm) ([place]) 10/28/2012  . Coronary artery disease 08/04/2012  . History of stroke without residual deficits 08/04/2012  . Tobacco dependence 08/04/2012  . Abdominal aneurysm without mention of rupture 01/08/2012  . CLOSTRIDIUM DIFFICILE COLITIS 08/30/2009  . DIVERTICULOSIS-COLON 08/30/2009  . WEIGHT LOSS-ABNORMAL 08/30/2009  . PERSONAL HX COLONIC POLYPS 08/30/2009    Palliative Care Assessment & Plan  Patient Profile: 87 y.o. male  with past medical history of ischemic cardiomyopathy status post CABG, CVA, chronic  kidney disease stage IV who presented with dyspnea and was subsequently admitted on 12/17/2020 with Covid 19 pneumonia.  He has had very poor oral intake, has been confused, and he has been agitated.  His oxygen saturation has improved since admission.  There has been overall decline in his health over the past several months and his daughter requested further discussion with palliative care team.  Recommendations/Plan:  DNR/DNI  Plan for skilled facility for rehab at time of discharge.  Eventual goal is for him to return to his home.  Recommend palliative care to follow as outpatient.  No further palliative specific recommendations at this point.  Goals are clear and he has no acute symptom management needs.  We will not round on Mr. Sackmann on a daily basis moving forward.  Please call if there are palliative specific needs with which we can be of assistance.  Code Status:    Code Status Orders  (From admission, onward)         Start     Ordered   12/23/20 1104  Do not attempt resuscitation (DNR)  Continuous       Question Answer Comment  In the event of cardiac or respiratory ARREST Do not call a "code blue"   In the event of cardiac or respiratory ARREST Do not perform Intubation, CPR, defibrillation or ACLS   In the event of cardiac or respiratory ARREST Use medication by any route, position, wound care, and other measures to relive pain and suffering. May use oxygen, suction and manual treatment of airway obstruction as needed for comfort.      12/23/20 1103        Code Status History    Date Active Date Inactive Code Status Order ID Comments User Context   12/17/2020 2143 12/23/2020 1103 Full Code [id number]  Etta Quill, DO ED   04/26/2017 1953 04/27/2017 1636 Full Code [id number]  Alvia Grove, PA-C Inpatient   04/09/2017 1228 04/10/2017 1631 Full Code [id number]  Gabriel Earing, PA-C Inpatient   08/07/2012 0832 08/10/2012 1353 Full Code [id number]  Grace Isaac, MD  Inpatient   08/05/2012 1334 08/07/2012 0832 Full Code [id number]  Raquel James, RN Inpatient   Advance Care Planning Activity    Advance Directive Documentation   Flowsheet Row Most Recent Value  Type of Advance Directive Healthcare Power of Attorney, Living will  Pre-existing out of facility DNR order (yellow form or pink MOST form) --  "MOST" Form in Place? --       Prognosis: Guarded  Discharge Planning:  To Be Determined-I believe he will need skilled facility for rehab at time of discharge  Care plan was discussed with daughter  Thank you for allowing the Palliative Medicine Team to assist in the care of this patient.   Total Time 20 Prolonged Time Billed No      Greater than 50%  of this time was spent counseling and coordinating care related to the above assessment and plan.  Micheline Rough, MD  Please contact Palliative Medicine Team phone at [phone] for questions and concerns.

## 2020-12-27 NOTE — TOC Transition Note (Addendum)
Transition of Care [name]) - CM/SW Discharge Note   Patient Details  Name: Marc [name] MRN: [id number] Date of Birth: 06/29/1930  Transition [name]) CM/SW Contact:  Marc Ludwig, LCSW Phone Number: 12/27/2020, 2:23 PM   Clinical Narrative:     Patient to be d/c'ed today to [name] room 103P.  Patient and family agreeable to plans will transport via ems RN to call report to [phone].  Patient's daughter Marc [name] is aware of patient discharging today.  Palliative is recommended to follow at SNF.  CSW spoke to [name] and gave referral for palliative to follow patient at SNF.      Final next level of care: Skilled Nursing Facility Barriers to Discharge: Barriers Resolved   Patient Goals and CMS Choice Patient states their goals for this hospitalization and ongoing recovery are:: To go to SNF for rehab, then return back home. CMS Medicare.gov Compare Post Acute Care list provided to:: Patient Represenative (must comment) Choice offered to / list presented to : Adult Children  Discharge Placement   Existing PASRR number confirmed : 12/23/20          Patient [facility] bed at: [name] Patient to be transferred to facility by: [facility] Name of family member notified: Daughter Marc [name] Patient and family notified of of transfer: 12/27/20  Discharge Plan and Services   Discharge Planning Services: CM Consult                                 Social Determinants of Health ([place]) Interventions     Readmission Risk Interventions No flowsheet data found.

## 2021-01-02 ENCOUNTER — Other Ambulatory Visit: Payer: Self-pay

## 2021-01-02 ENCOUNTER — Non-Acute Institutional Stay: Payer: Medicare Other | Admitting: Student

## 2021-01-02 DIAGNOSIS — Z515 Encounter for palliative care: Secondary | ICD-10-CM

## 2021-01-02 NOTE — Progress Notes (Signed)
[facility] Consult Note Telephone: [phone]  Fax: [phone]     Date of encounter: 01/02/21 PATIENT NAME: Marc [name] [id number] [phone] (home)  DOB: [birth year] MRN: [id number]  PRIMARY CARE PROVIDER:    Dr. Doree Barthel  REFERRING PROVIDER:   Dr. Doree Barthel  RESPONSIBLE PARTY:   Extended Emergency Contact Information Primary Emergency Contact: Eric [place] Phone: [phone] Relation: Daughter Secondary Emergency Contact: Thomas,(SIL)Jerry  [place] Phone: [phone] Relation: Other  I met face to face with patient and family in skilled facility. Palliative Care was asked to follow this patient by consultation request of Dr. Doree Barthel to help address advance care planning and goals of care. This is an initial visit.   ASSESSMENT AND RECOMMENDATIONS:   1. Advance Care Planning/Goals of Care: Goals include to maximize quality of life and symptom management. Our advance care planning conversation included a discussion about:     The value and importance of advance care planning.  Experiences with loved ones who have been seriously ill or have died.  Exploration of personal, cultural or spiritual beliefs that might influence medical decisions.   Exploration of goals of care in the event of a sudden injury or illness.  Review and updating or creation of an  advance directive document.  Decision not to resuscitate or to de-escalate disease focused treatments due to poor prognosis.  Patient expresses wanting to improve and return home.  2. Symptom Management:  Shortness of breath: Shortness of breath secondary to recent Covid infection, pneumonia, CAD. Continue oxygen via n/c at 4lpm. Staff to encourage incentive spirometry Q shift, albuterol 0.083% neb BID.  Weakness/impaired mobility: Continue OT/PT as directed. Staff to  assist with adl care needs. Wheel chair for locomotion. Appetite: Appetite poor; staff to assist with meals. Regular diet with finger foods. Routine weights encouraged. ST to evaluate and treat d/t dysphagia. 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 Daily dressing changes by wound care nurse; cleanse with NS, pat dry, apply silver alginate to wound bed, cover with dry dressing. Appointment with wound provider in facility on 01/03/21.  3. Follow up Palliative Care Visit: Palliative care will continue to follow for goals of care clarification and symptom management. Return 2 weeks or prn.  4. Family /Caregiver/Community Supports: Discussion via phone with daughter [name]; emotional support provided. Discussed patient's goal of returning home. She expresses patient would need to improve to return home and would need additional support in the home. Opened discussion of hospice if patient continues to decline.    I spent 45 minutes providing this consultation,  from 2:30pm to 3:15. More than 50% of the time in this consultation was spent in counseling and care coordination.  CODE STATUS: DNR, MOST form; limited interventions, antibiotics and IV fluids as indicated, no feeding tube.   PPS: 40%  HOSPICE ELIGIBILITY/DIAGNOSIS: TBD   CHIEF COMPLAINT: "I want to get better and return home."  HISTORY OF PRESENT ILLNESS:  Marc [name] is a 87 y.o. year old male with acute respiratory failure with hypoxia, acute metabolic encephalopathy, pneumonia secondary to Covid 19 virus; CAD, PAD, HTN, CKD, sinus bradycardia, chronic ulcer to right foot 2nd toe. Recent hospitalization 12/17/20-12/27/20. Prior to admission to [facility], he resided at home with family/ caregiver. Mr. Minahan receives assistance with all adl's. Patient reports just not having an appetite; staff endorses poor appetite. He endorses  weakness. He is out of bed for short periods. Staff report patient receiving OT/PT services. He also  has a ST order due to swallowing difficulty. He denies shortness of breath at rest, although his sats on room air are 76%. Both staff and daughter report resident removing his oxygen at times. He denies pain, no chest pain, palpitations.   We are asked to consult around advance care planning and complex medical decision making.    History obtained from review of EMR, discussion with primary team, and  interview with family, caregiver  and/or Mr. Spahr. Records reviewed and summarized above.   CURRENT PROBLEM LIST:  Patient Active Problem List   Diagnosis Date Noted  . Acute respiratory failure with hypoxia ([place]) 12/18/2020  . Pneumonia due to COVID-19 virus 12/18/2020  . CAP (community acquired pneumonia) 12/18/2020  . Severe sinus bradycardia 12/18/2020  . QT prolongation 12/18/2020  . CKD (chronic kidney disease), stage IV ([place]) 12/18/2020  . CKD (chronic kidney disease) stage 4, GFR 15-29 ml/min (HCC) 12/17/2020  . Acute hypoxemic respiratory failure due to COVID-19 ([place]) 12/17/2020  . Acute metabolic encephalopathy 69/62/9528  . Hammer toes of both feet 05/14/2018  . History of amputation of lesser toe of left foot ([place]) 05/14/2018  . Impaired mobility and activities of daily living 04/24/2018  . Cellulitis of left foot 04/09/2018  . Foot callus 03/28/2018  . Nail dystrophy 03/28/2018  . PAD (peripheral artery disease) ([place]) 03/28/2018  . Acquired absence of left great toe ([place]) 02/18/2018  . Diplopia   . INO (internuclear ophthalmoplegia), bilateral   . CVA (cerebral vascular accident) ([place]) 05/04/2017  . Abdominal aortic aneurysm (AAA) ([place]) 04/26/2017  . Atherosclerosis of native arteries of the extremities with intermittent claudication 07/06/2014  . Pain in limb 07/06/2014  . Weakness of both legs 07/06/2014  . Numbness in both legs 07/06/2014  . Acute on chronic renal insufficiency 02/20/2013  . Essential hypertension 12/01/2012  . AAA (abdominal aortic aneurysm)  ([place]) 10/28/2012  . Coronary artery disease 08/04/2012    Class: Diagnosis of  . History of stroke without residual deficits 08/04/2012    Class: Diagnosis of  . Tobacco dependence 08/04/2012  . Abdominal aneurysm without mention of rupture 01/08/2012  . CLOSTRIDIUM DIFFICILE COLITIS 08/30/2009  . DIVERTICULOSIS-COLON 08/30/2009  . WEIGHT LOSS-ABNORMAL 08/30/2009  . PERSONAL HX COLONIC POLYPS 08/30/2009   PAST MEDICAL HISTORY:  Active Ambulatory Problems    Diagnosis Date Noted  . CLOSTRIDIUM DIFFICILE COLITIS 08/30/2009  . DIVERTICULOSIS-COLON 08/30/2009  . WEIGHT LOSS-ABNORMAL 08/30/2009  . PERSONAL HX COLONIC POLYPS 08/30/2009  . Abdominal aneurysm without mention of rupture 01/08/2012  . Coronary artery disease 08/04/2012  . History of stroke without residual deficits 08/04/2012  . Tobacco dependence 08/04/2012  . AAA (abdominal aortic aneurysm) ([place]) 10/28/2012  . Essential hypertension 12/01/2012  . Acute on chronic renal insufficiency 02/20/2013  . Atherosclerosis of native arteries of the extremities with intermittent claudication 07/06/2014  . Pain in limb 07/06/2014  . Weakness of both legs 07/06/2014  . Numbness in both legs 07/06/2014  . Abdominal aortic aneurysm (AAA) ([place]) 04/26/2017  . CVA (cerebral vascular accident) ([place]) 05/04/2017  . Diplopia   . INO (internuclear ophthalmoplegia), bilateral   . Acquired absence of left great toe ([place]) 02/18/2018  . Cellulitis of left foot 04/09/2018  . Foot callus 03/28/2018  . Hammer toes of both feet 05/14/2018  . History of amputation of lesser toe of left foot ([place]) 05/14/2018  . Impaired mobility and activities  of daily living 04/24/2018  . Nail dystrophy 03/28/2018  . PAD (peripheral artery disease) ([place]) 03/28/2018  . CKD (chronic kidney disease) stage 4, GFR 15-29 ml/min (HCC) 12/17/2020  . Acute hypoxemic respiratory failure due to COVID-19 ([place]) 12/17/2020  . Acute metabolic encephalopathy 61/95/0932  . Acute  respiratory failure with hypoxia ([place]) 12/18/2020  . Pneumonia due to COVID-19 virus 12/18/2020  . CAP (community acquired pneumonia) 12/18/2020  . Severe sinus bradycardia 12/18/2020  . QT prolongation 12/18/2020  . CKD (chronic kidney disease), stage IV ([place]) 12/18/2020   Resolved Ambulatory Problems    Diagnosis Date Noted  . No Resolved Ambulatory Problems   Past Medical History:  Diagnosis Date  . Arthritis   . CKD (chronic kidney disease)   . Falls frequently   . Gall stones   . GERD (gastroesophageal reflux disease)   . Gout   . History of Clostridium difficile infection   . History of kidney stones   . History of ulcerative colitis   . Hyperlipidemia   . Hypertension   . Kidney stone on left side   . Stroke [facility]) 2012   SOCIAL HX:  Social History   Tobacco Use  . Smoking status: Current Some Day Smoker    Packs/day: 1.00    Years: 68.00    Pack years: 68.00    Types: Cigarettes  . Smokeless tobacco: Never Used  . Tobacco comment: has 1-3 a week  Substance Use Topics  . Alcohol use: No    Alcohol/week: 0.0 standard drinks   FAMILY HX:  Family History  Problem Relation Age of Onset  . Stroke Mother   . Hypertension Mother   . Heart disease Sister        Aneurysm of the Brain      ALLERGIES: No Known Allergies   PERTINENT MEDICATIONS:  Outpatient Encounter Medications as of 01/02/2021  Medication Sig  . acetaminophen (TYLENOL) 500 MG tablet Take 500 mg by mouth at bedtime.  Marland Kitchen allopurinol (ZYLOPRIM) 100 MG tablet Take 200 mg by mouth every morning.  Marland Kitchen amLODipine (NORVASC) 5 MG tablet Take 5 mg by mouth every morning.  . B Complex Vitamins (VITAMIN B COMPLEX) TABS Take 1 tablet by mouth every morning.  . cholecalciferol (VITAMIN D) 1000 units tablet Take 1,000 Units by mouth every morning.  . clopidogrel (PLAVIX) 75 MG tablet Take 1 tablet (75 mg total) by mouth daily. (Patient taking differently: Take 75 mg by mouth every morning.)  . escitalopram  (LEXAPRO) 20 MG tablet Take 20 mg by mouth at bedtime.   . famotidine (PEPCID) 20 MG tablet Take 20 mg by mouth every morning.  . feeding supplement (ENSURE ENLIVE / ENSURE PLUS) LIQD Take 237 mLs by mouth 2 (two) times daily between meals.  . gabapentin (NEURONTIN) 100 MG capsule Take 100 mg by mouth at bedtime.   Marland Kitchen guaiFENesin-dextromethorphan (ROBITUSSIN DM) 100-10 MG/5ML syrup Take 5 mLs by mouth every 6 (six) hours as needed for cough.  . Ipratropium-Albuterol (COMBIVENT) 20-100 MCG/ACT AERS respimat Inhale 1 puff into the lungs every 6 (six) hours as needed for wheezing.  . metoprolol tartrate (LOPRESSOR) 50 MG tablet Take 25 mg by mouth every morning.  . Multiple Vitamin (MULTIVITAMIN WITH MINERALS) TABS Take 1 tablet by mouth every morning. Centrum silver 50 plus  . rosuvastatin (CRESTOR) 20 MG tablet Take 1 tablet (20 mg total) by mouth daily at 6 PM. (Patient taking differently: Take 20 mg by mouth every morning.)  . sodium bicarbonate  650 MG tablet Take 650 mg by mouth See admin instructions. Take one tablet (650 mg) by mouth twice daily (take evening dose 2 hours before gabapentin)  . tamsulosin (FLOMAX) 0.4 MG CAPS capsule Take 0.4 mg by mouth at bedtime.   No facility-administered encounter medications on file as of 01/02/2021.    ROS  General: NAD, denies pain EYES: denies vision changes Cardiovascular: denies chest pain, palpitations Pulmonary: denies  cough, denies increased SOB Abdomen: endorses poor ppetite, denies constipation GU: denies dysuria, endorses continence of urine MSK: no falls reported Skin: wound to right 2nd toe Neurological: endorses weakness, denies insomnia Psych: reports mood being "okay" Heme/lymph/immuno: denies bruises, abnormal bleeding  Physical Exam:  Constitutional: NAD General: frail appearing, pale, not wearing oxygen  EYES: anicteric sclera, lids intact, no discharge  ENMT: intact hearing, dentition intact CV: S1S2, RRR, no LE  edema Pulmonary: Upper lobes clear, fine crackles to right base, no increased work of breathing, no cough, no audible wheezes. sats 76% on room air; up to 92% at 4lpm Abdomen: Soft, non-tender;  normo-active BS x 4 GU: deferred MSK: non ambulatory Skin: cool to touch, wound to right 2nd toe, not visualized dressing CDI Neuro: Generalized weakness Psych: A & O x 2; non-anxious affect Hem/lymph/immuno: no widespread bruising   Thank you for the opportunity to participate in the care of  Mr. Teal. The palliative care team will continue to follow. Please call our office at [phone] if we can be of additional assistance.  Ezekiel Slocumb, NP , AGPCNP-C

## 2021-01-12 ENCOUNTER — Telehealth: Payer: Self-pay | Admitting: Student

## 2021-01-12 NOTE — Telephone Encounter (Signed)
NP returned call to patient's daughter [name]. She states the plan is for patient to discharge home on 01/20/21. NP is to see patient in facility on 01/17/21. She states she has asked for lab work to be completed prior to his discharge. We discussed equipment needs that he will need when he returns home such as oxygen concentrator, hospital bed. She is to speak with facility regarding these needs. She is advised that Palliative Care will continue to follow patient at home. She is encouraged to call with any needs/concerns.

## 2021-01-17 ENCOUNTER — Non-Acute Institutional Stay: Payer: Medicare Other | Admitting: Student

## 2021-01-17 ENCOUNTER — Other Ambulatory Visit: Payer: Self-pay

## 2021-01-17 DIAGNOSIS — Z515 Encounter for palliative care: Secondary | ICD-10-CM

## 2021-01-17 NOTE — Progress Notes (Signed)
[facility] Consult Note Telephone: [phone]  Fax: [phone]  PATIENT NAME: Marc [name] [place] [address] [phone] (home)  DOB: 1930/07/05 MRN: [id number]  PRIMARY CARE PROVIDER:    Leonard Downing, MD,  [place] [address] [phone]  REFERRING PROVIDER:   Leonard Downing, MD [address] [place],  [place] [address] [phone]  RESPONSIBLE PARTY:   Extended Emergency Contact Information Primary Emergency Contact: Eric [place] Phone: [phone] Relation: Daughter Secondary Emergency Contact: Thomas,(SIL)Jerry  [place] Phone: [phone] Relation: Other  I met face to face with patient and daughter [name] in facility.   ASSESSMENT AND RECOMMENDATIONS:   Advance Care Planning: Visit at the request of Dr. Keenan Bachelor for palliative consult. Visit consisted of building trust and discussions on Palliative care medicine as specialized medical care for people living with serious illness, aimed at facilitating improved quality of life through symptoms relief, assisting with advance care planning and establishing goals of care. Education provided on Palliative vs. Hospice services. Discussed patient not improving, continued decline. Family is in agreement with Hospice referral. Discussed patient with Dr. Eulas Post; she is in agreement with patient being admitted to Hospice services.  Palliative care will continue to provide support to patient, family and the medical team.  Goal of care: For patient to be comfortable.   Directives: DNR, MOST form; limited interventions, antibiotics and IV fluids as indicated, no feeding tube.    Symptom Management:   Shortness of breath: Shortness of breath secondary to recent Covid infection, pneumonia, CAD. Continue oxygen via n/c at 4lpm. Staff to encourage incentive  spirometry Q shift, albuterol 0.083% neb BID.   Weakness/impaired mobility: Patient currently receiving OT/PT; therapy ending 01/19/2021. Patient has had some difficulty participating, not progressing with therapy. Staff to assist with adl care needs. Wheel chair for locomotion.  Appetite: Appetite poor; staff to assist with meals. Regular diet with finger foods. Routine weights encouraged.  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 Daily dressing care by wound care nurse.    Follow up Palliative Care Visit: Palliative care will continue to follow for complex decision making and symptom management until he is admitted to Hospice services. Return as needed.   Cognitive / Functional decline: Patient requires assistance with all adl's. He is mostly in bed; able to sit up for only short periods. He becomes shortness of breath with any exertion. Staff to continue to assist with adl's. Patient being referred for Hospice services due to decline.  I spent 35 minutes providing this consultation, from 1:25pm to 2:00pm. Time includes time spent with patient/family, chart review, provider coordination, and documentation. More than 50% of the time in this consultation was spent counseling and coordinating communication.   CHIEF COMPLAINT: Palliative Medicine follow up, shortness of breath.  History obtained from review of EMR, discussion with primary team, and  interview with family. Records reviewed and summarized below.  HISTORY OF PRESENT ILLNESS:  Marc [name] is a 87 y.o. year old male with multiple medical problems including acute respiratory failure with hypoxia, acute metabolic encephalopathy, pneumonia secondary to Covid 19 virus; CAD, PAD, HTN, CKD, sinus bradycardia, chronic ulcer to right foot 2nd toe. Palliative Care was asked to follow this patient by consultation request of Dr. Westley Gambles  to help address advance care planning and goals of care. This is a follow up visit.  Mr.  [name]  currently resides at [facility] and [place]. He has been receiving therapy services; services to end on 01/19/2021. Daughter [name] and staff report patient not progressing well with therapy. He still becomes shortness of breath with any exertion; he easily desats to 70's if his oxygen is removed. Increased confusion noted at times. Patient is sleeping mostly throughout the day. He is only out of bed for short periods of time. Able to walk short distances with therapy. Poor appetite continues; he is eating 25% or less of meals. Patient is anticipated to discharge home with family on Monday 01/23/2021.  CODE STATUS: DNR  PPS: 40%, weak  HOSPICE ELIGIBILITY/DIAGNOSIS:   ROS   General: NAD EYES: denies vision changes, wears glasses  ENMT: denies dysphagia Cardiovascular: denies chest pain Pulmonary: denies cough, increased SOB with any exertion Abdomen: Bowel sounds normoactive x 4  GU: denies dysuria, endorses incontinence of urine MSK:  endorses ROM limitations, no falls reported Skin: wound to right foot Neurological: endorses weakness, denies pain, denies insomnia Heme/lymph/immuno: denies bruises, abnormal bleeding   Physical Exam: Weight: 128 pounds 01/16/21, 132 pounds 01/11/21, 156 pounds 04/2020 Constitutional: NAD, somnolent General: frail, thin, ill appearing EYES: anicteric sclera, lids intact, no discharge  ENMT: intact hearing, dry oral mucous membranes CV: RRR, no LE edema Pulmonary: Upper lobes clear, bases diminished, no increased work of breathing, no cough, no audible wheezes, oxygen 3lpm Abdomen: Bowel sounds normoactive x 4 GU: deferred MSK: sarcopenia Skin: warm and dry, scab to right 2nd toe Neuro: Generalized weakness, alert and oriented x 2 Psych: flat affect Hem/lymph/immuno: no widespread bruising   PAST MEDICAL HISTORY:  Past Medical History:  Diagnosis Date  . AAA (abdominal aortic aneurysm) ([place])   . AAA (abdominal aortic aneurysm) ([place])   .  Arthritis   . CKD (chronic kidney disease)    CKD III-IV (Dr. Posey Pronto, [facility])  . Coronary artery disease    s/p CABG Dr. Servando Snare, Dr. Acie Fredrickson  . Falls frequently   . Gall stones    patiet unsure  . GERD (gastroesophageal reflux disease)   . Gout   . History of Clostridium difficile infection   . History of kidney stones   . History of ulcerative colitis   . Hyperlipidemia   . Hypertension   . Kidney stone on left side    has not passed as of 09/23/12  . Stroke [facility]) 2012   affecting RUE    SOCIAL HX:  Social History   Tobacco Use  . Smoking status: Current Some Day Smoker    Packs/day: 1.00    Years: 68.00    Pack years: 68.00    Types: Cigarettes  . Smokeless tobacco: Never Used  . Tobacco comment: has 1-3 a week  Substance Use Topics  . Alcohol use: No    Alcohol/week: 0.0 standard drinks   FAMILY HX:  Family History  Problem Relation Age of Onset  . Stroke Mother   . Hypertension Mother   . Heart disease Sister        Aneurysm of the Brain    ALLERGIES: No Known Allergies   PERTINENT MEDICATIONS:  Outpatient Encounter Medications as of 01/17/2021  Medication Sig  . acetaminophen (TYLENOL) 500 MG tablet Take 500 mg by mouth at bedtime.  Marland Kitchen allopurinol (ZYLOPRIM) 100 MG tablet Take 200 mg by mouth every morning.  Marland Kitchen amLODipine (NORVASC) 5 MG tablet Take 5 mg by mouth every morning.  . B Complex Vitamins (VITAMIN B COMPLEX) TABS Take 1 tablet  by mouth every morning.  . cholecalciferol (VITAMIN D) 1000 units tablet Take 1,000 Units by mouth every morning.  . clopidogrel (PLAVIX) 75 MG tablet Take 1 tablet (75 mg total) by mouth daily. (Patient taking differently: Take 75 mg by mouth every morning.)  . escitalopram (LEXAPRO) 20 MG tablet Take 20 mg by mouth at bedtime.   . famotidine (PEPCID) 20 MG tablet Take 20 mg by mouth every morning.  . feeding supplement (ENSURE ENLIVE / ENSURE PLUS) LIQD Take 237 mLs by mouth 2 (two) times daily between meals.  .  gabapentin (NEURONTIN) 100 MG capsule Take 100 mg by mouth at bedtime.   Marland Kitchen guaiFENesin-dextromethorphan (ROBITUSSIN DM) 100-10 MG/5ML syrup Take 5 mLs by mouth every 6 (six) hours as needed for cough.  . Ipratropium-Albuterol (COMBIVENT) 20-100 MCG/ACT AERS respimat Inhale 1 puff into the lungs every 6 (six) hours as needed for wheezing.  . metoprolol tartrate (LOPRESSOR) 50 MG tablet Take 25 mg by mouth every morning.  . Multiple Vitamin (MULTIVITAMIN WITH MINERALS) TABS Take 1 tablet by mouth every morning. Centrum silver 50 plus  . rosuvastatin (CRESTOR) 20 MG tablet Take 1 tablet (20 mg total) by mouth daily at 6 PM. (Patient taking differently: Take 20 mg by mouth every morning.)  . sodium bicarbonate 650 MG tablet Take 650 mg by mouth See admin instructions. Take one tablet (650 mg) by mouth twice daily (take evening dose 2 hours before gabapentin)  . tamsulosin (FLOMAX) 0.4 MG CAPS capsule Take 0.4 mg by mouth at bedtime.   No facility-administered encounter medications on file as of 01/17/2021.     Thank you for the opportunity to participate in the care of Mr. Buss. The palliative care team will continue to follow. Please call our office at [phone] if we can be of additional assistance.  Ezekiel Slocumb, NP, AGPCNP-C

## 2021-02-24 DEATH — deceased
